# Patient Record
Sex: Female | Born: 1940
Health system: Southern US, Community
[De-identification: ages and names within clinical notes are randomized; demographics above are authoritative.]

## PROBLEM LIST (undated history)

## (undated) DIAGNOSIS — Z860100 Personal history of colon polyps, unspecified: Secondary | ICD-10-CM

## (undated) DIAGNOSIS — E785 Hyperlipidemia, unspecified: Secondary | ICD-10-CM

## (undated) DIAGNOSIS — Z973 Presence of spectacles and contact lenses: Secondary | ICD-10-CM

## (undated) DIAGNOSIS — G709 Myoneural disorder, unspecified: Secondary | ICD-10-CM

## (undated) DIAGNOSIS — N891 Moderate vaginal dysplasia: Secondary | ICD-10-CM

## (undated) DIAGNOSIS — H409 Unspecified glaucoma: Secondary | ICD-10-CM

## (undated) DIAGNOSIS — Z87410 Personal history of cervical dysplasia: Secondary | ICD-10-CM

## (undated) DIAGNOSIS — G2569 Other tics of organic origin: Secondary | ICD-10-CM

## (undated) DIAGNOSIS — I10 Essential (primary) hypertension: Secondary | ICD-10-CM

## (undated) DIAGNOSIS — M8580 Other specified disorders of bone density and structure, unspecified site: Secondary | ICD-10-CM

## (undated) HISTORY — PX: TUBAL LIGATION: SHX77

## (undated) HISTORY — DX: Hyperlipidemia, unspecified: E78.5

## (undated) HISTORY — DX: Essential (primary) hypertension: I10

## (undated) HISTORY — DX: Other specified disorders of bone density and structure, unspecified site: M85.80

## (undated) HISTORY — DX: Other tics of organic origin: G25.69

## (undated) HISTORY — PX: DILATION AND CURETTAGE OF UTERUS: SHX78

---

## 1998-02-19 ENCOUNTER — Emergency Department (HOSPITAL_COMMUNITY): Admission: EM | Admit: 1998-02-19 | Discharge: 1998-02-19 | Payer: Self-pay | Admitting: Emergency Medicine

## 1999-07-04 ENCOUNTER — Encounter: Payer: Self-pay | Admitting: Emergency Medicine

## 1999-07-04 ENCOUNTER — Emergency Department (HOSPITAL_COMMUNITY): Admission: EM | Admit: 1999-07-04 | Discharge: 1999-07-05 | Payer: Self-pay | Admitting: Emergency Medicine

## 1999-07-10 ENCOUNTER — Ambulatory Visit (HOSPITAL_COMMUNITY): Admission: RE | Admit: 1999-07-10 | Discharge: 1999-07-10 | Payer: Self-pay | Admitting: Endocrinology

## 1999-07-10 ENCOUNTER — Encounter: Payer: Self-pay | Admitting: Endocrinology

## 1999-12-06 ENCOUNTER — Encounter: Payer: Self-pay | Admitting: Internal Medicine

## 1999-12-06 ENCOUNTER — Ambulatory Visit (HOSPITAL_COMMUNITY): Admission: RE | Admit: 1999-12-06 | Discharge: 1999-12-06 | Payer: Self-pay | Admitting: Internal Medicine

## 2004-10-08 ENCOUNTER — Emergency Department (HOSPITAL_COMMUNITY): Admission: EM | Admit: 2004-10-08 | Discharge: 2004-10-09 | Payer: Self-pay | Admitting: Emergency Medicine

## 2005-12-30 ENCOUNTER — Ambulatory Visit: Payer: Self-pay | Admitting: Internal Medicine

## 2006-04-03 ENCOUNTER — Ambulatory Visit: Payer: Self-pay | Admitting: Internal Medicine

## 2006-05-15 ENCOUNTER — Ambulatory Visit: Payer: Self-pay | Admitting: Internal Medicine

## 2006-06-26 ENCOUNTER — Ambulatory Visit: Payer: Self-pay | Admitting: Internal Medicine

## 2007-07-04 DIAGNOSIS — E785 Hyperlipidemia, unspecified: Secondary | ICD-10-CM

## 2007-07-04 DIAGNOSIS — I10 Essential (primary) hypertension: Secondary | ICD-10-CM

## 2008-01-15 ENCOUNTER — Ambulatory Visit: Payer: Self-pay | Admitting: Internal Medicine

## 2008-01-15 DIAGNOSIS — R5381 Other malaise: Secondary | ICD-10-CM

## 2008-01-15 DIAGNOSIS — R5383 Other fatigue: Secondary | ICD-10-CM

## 2008-01-15 LAB — CONVERTED CEMR LAB
Albumin: 4 g/dL (ref 3.5–5.2)
Alkaline Phosphatase: 85 units/L (ref 39–117)
Bilirubin Urine: NEGATIVE
Bilirubin, Direct: 0.6 mg/dL — ABNORMAL HIGH (ref 0.0–0.3)
Eosinophils Absolute: 0.1 10*3/uL (ref 0.0–0.7)
GFR calc Af Amer: 93 mL/min
GFR calc non Af Amer: 77 mL/min
HCT: 42.6 % (ref 36.0–46.0)
Hemoglobin, Urine: NEGATIVE
MCV: 87.3 fL (ref 78.0–100.0)
Monocytes Absolute: 0.6 10*3/uL (ref 0.1–1.0)
Monocytes Relative: 9.5 % (ref 3.0–12.0)
Neutrophils Relative %: 64 % (ref 43.0–77.0)
Nitrite: NEGATIVE
Platelets: 286 10*3/uL (ref 150–400)
Potassium: 4.1 meq/L (ref 3.5–5.1)
RDW: 12.1 % (ref 11.5–14.6)
Sodium: 139 meq/L (ref 135–145)
Total Bilirubin: 1.4 mg/dL — ABNORMAL HIGH (ref 0.3–1.2)
Total Protein, Urine: NEGATIVE mg/dL
Urobilinogen, UA: 0.2 (ref 0.0–1.0)

## 2008-04-18 ENCOUNTER — Ambulatory Visit: Payer: Self-pay | Admitting: Internal Medicine

## 2008-04-18 DIAGNOSIS — H9209 Otalgia, unspecified ear: Secondary | ICD-10-CM | POA: Insufficient documentation

## 2008-08-28 ENCOUNTER — Ambulatory Visit: Payer: Self-pay | Admitting: Internal Medicine

## 2008-08-29 LAB — CONVERTED CEMR LAB
CO2: 27 meq/L (ref 19–32)
Chloride: 104 meq/L (ref 96–112)
GFR calc Af Amer: 93 mL/min
Glucose, Bld: 98 mg/dL (ref 70–99)
Potassium: 3.7 meq/L (ref 3.5–5.1)
Sodium: 139 meq/L (ref 135–145)

## 2008-12-17 ENCOUNTER — Ambulatory Visit (HOSPITAL_COMMUNITY): Admission: RE | Admit: 2008-12-17 | Discharge: 2008-12-17 | Payer: Self-pay | Admitting: Obstetrics & Gynecology

## 2009-03-11 LAB — HM MAMMOGRAPHY

## 2009-07-07 ENCOUNTER — Ambulatory Visit: Payer: Self-pay | Admitting: Internal Medicine

## 2009-07-07 DIAGNOSIS — F959 Tic disorder, unspecified: Secondary | ICD-10-CM | POA: Insufficient documentation

## 2009-12-09 ENCOUNTER — Encounter (INDEPENDENT_AMBULATORY_CARE_PROVIDER_SITE_OTHER): Payer: Self-pay | Admitting: *Deleted

## 2009-12-28 ENCOUNTER — Ambulatory Visit: Payer: Self-pay | Admitting: Internal Medicine

## 2009-12-28 ENCOUNTER — Telehealth: Payer: Self-pay | Admitting: Internal Medicine

## 2009-12-29 ENCOUNTER — Telehealth: Payer: Self-pay | Admitting: Internal Medicine

## 2010-02-05 ENCOUNTER — Encounter (INDEPENDENT_AMBULATORY_CARE_PROVIDER_SITE_OTHER): Payer: Self-pay | Admitting: *Deleted

## 2010-02-09 ENCOUNTER — Ambulatory Visit: Payer: Self-pay | Admitting: Internal Medicine

## 2010-02-23 ENCOUNTER — Ambulatory Visit: Payer: Self-pay | Admitting: Internal Medicine

## 2010-02-23 HISTORY — PX: COLONOSCOPY: SHX174

## 2010-02-25 ENCOUNTER — Encounter: Payer: Self-pay | Admitting: Internal Medicine

## 2010-07-01 ENCOUNTER — Ambulatory Visit: Payer: Self-pay | Admitting: Internal Medicine

## 2010-08-08 LAB — CONVERTED CEMR LAB
ALT: 25 units/L (ref 0–35)
AST: 28 units/L (ref 0–37)
Albumin: 4.1 g/dL (ref 3.5–5.2)
Basophils Absolute: 0 10*3/uL (ref 0.0–0.1)
Cholesterol: 255 mg/dL — ABNORMAL HIGH (ref 0–200)
Direct LDL: 174.6 mg/dL
Eosinophils Relative: 2.3 % (ref 0.0–5.0)
GFR calc non Af Amer: 89.56 mL/min (ref >60)
Glucose, Bld: 87 mg/dL (ref 70–99)
HCT: 42 % (ref 36.0–46.0)
Hemoglobin: 14 g/dL (ref 12.0–15.0)
Lymphs Abs: 2.1 10*3/uL (ref 0.7–4.0)
Monocytes Relative: 12.3 % — ABNORMAL HIGH (ref 3.0–12.0)
Neutro Abs: 1.9 10*3/uL (ref 1.4–7.7)
Potassium: 4.1 meq/L (ref 3.5–5.1)
RDW: 13.8 % (ref 11.5–14.6)
Sodium: 143 meq/L (ref 135–145)
Specific Gravity, Urine: 1.02 (ref 1.000–1.030)
Total Protein, Urine: NEGATIVE mg/dL
Urine Glucose: NEGATIVE mg/dL
pH: 5.5 (ref 5.0–8.0)

## 2010-08-10 NOTE — Procedures (Signed)
Summary: Colonoscopy  Patient: Laurie Pratt Note: All result statuses are Final unless otherwise noted.  Tests: (1) Colonoscopy (COL)   COL Colonoscopy           DONE (C)     [facility] [employer] [facility] [employer].     [place], [place]  [id number]           COLONOSCOPY PROCEDURE REPORT           PATIENT:  [name], [name]  MR#:  [id number]     BIRTHDATE:  06-26-1941, 45 yrs. old  GENDER:  female     ENDOSCOPIST:  Lowella Bandy. Olevia Perches, MD     REF. BY:  Altamese Seaford. Plotnikov, M.D.     PROCEDURE DATE:  02/23/2010     PROCEDURE:  Colonoscopy [id number]     ASA CLASS:  Class II     INDICATIONS:  Routine Risk Screening     MEDICATIONS:   Versed 7 mg, Fentanyl 50 mcg           DESCRIPTION OF PROCEDURE:   After the risks benefits and     alternatives of the procedure were thoroughly explained, informed     consent was obtained.  Digital rectal exam was performed and     revealed no rectal masses.   The LB [id number]-[id number] endoscope     was introduced through the anus and advanced to the cecum, which     was identified by both the appendix and ileocecal valve, without     limitations.  The quality of the prep was good, using MiraLax.     The instrument was then slowly withdrawn as the colon was fully     examined.     <<PROCEDUREIMAGES>>           FINDINGS:  A sessile polyp was found. 5 mm polyp at 20 cm The     polyp was removed using cold biopsy forceps (see image7, image8,     and image9).  This was otherwise a normal examination of the colon     (see image10, image6, image5, image4, image3, and image1).     Retroflexed views in the rectum revealed no abnormalities.    The     scope was then withdrawn from the patient and the procedure     completed.           COMPLICATIONS:  None     ENDOSCOPIC IMPRESSION:     1) Sessile polyp     2) Otherwise normal examination     RECOMMENDATIONS:     1) Await pathology results     2) High fiber diet.     REPEAT EXAM:  In 7 - 10 year(s) for.  recall  pending results of     the polyp           ______________________________     Lowella Bandy. Olevia Perches, MD           CC:           n.     REVISED:  02/23/2010 11:30 AM     eSIGNED:   Lowella Bandy. Brodie at 02/23/2010 11:30 AM           Renetta Chalk, [id number]  Note: An exclamation mark (!) indicates a result that was not dispersed into the flowsheet. Document Creation Date: 02/23/2010 11:30 AM _______________________________________________________________________  (1) Order result status: Final Collection or observation date-time: 02/23/2010 10:46 Requested date-time:  Receipt date-time:  Reported date-time:  Referring Physician:   Ordering Physician: Delfin Edis [phone]) Specimen Source:  Source: Tawanna Cooler Order Number: [id number] Lab site:   Appended Document: Colonoscopy     Procedures Next Due Date:    Colonoscopy: 03/2020

## 2010-08-10 NOTE — Letter (Signed)
Summary: [name] Instructions  [facility]  [place], [place] [address]   Phone: [phone]  Fax: [phone]       [name]    02-Nov-1940    MRN: [id number]       Procedure Day Sudie Grumbling:  Duke Salvia  02/23/10     Arrival Time: 9:00AM     Procedure Time:  10:00AM     Location of Procedure:                    _ X_  [place] (4th Floor)   [place]  Starting 5 days prior to your procedure 02/18/10 do not eat nuts, seeds, popcorn, corn, beans, peas,  salads, or any raw vegetables.  Do not take any fiber supplements (e.g. Metamucil, Citrucel, and Benefiber). ____________________________________________________________________________________________________   THE DAY BEFORE YOUR PROCEDURE         DATE: 02/22/10  DAY: MONDAY  1   Drink clear liquids the entire day-NO SOLID FOOD  2   Do not drink anything colored red or purple.  Avoid juices with pulp.  No orange juice.  3   Drink at least 64 oz. (8 glasses) of fluid/clear liquids during the day to prevent dehydration and help the prep work efficiently.  CLEAR LIQUIDS INCLUDE: Water Jello Ice Popsicles Tea (sugar ok, no milk/cream) Powdered fruit flavored drinks Coffee (sugar ok, no milk/cream) Gatorade Juice: apple, white grape, white cranberry  Lemonade Clear bullion, consomm, broth Carbonated beverages (any kind) Strained chicken noodle soup Hard Candy  4   Mix the entire bottle of Miralax with 64 oz. of Gatorade/Powerade in the morning and put in the refrigerator to chill.  5   At 3:00 pm take 2 Dulcolax/Bisacodyl tablets.  6   At 4:30 pm take one Reglan/Metoclopramide tablet.  7  Starting at 5:00 pm drink one 8 oz glass of the Miralax mixture every 15-20 minutes until you have finished drinking the entire 64 oz.  You should finish drinking prep around 7:30 or 8:00 pm.  8   If you are nauseated, you may take the 2nd Reglan/Metoclopramide  tablet at 6:30 pm.        9    At 8:00 pm take 2 more DULCOLAX/Bisacodyl tablets.     THE DAY OF YOUR PROCEDURE      DATE:  02/23/10  DAY: Duke Salvia  You may drink clear liquids until 8:00AM  (2 HOURS BEFORE PROCEDURE).   MEDICATION INSTRUCTIONS  Unless otherwise instructed, you should take regular prescription medications with a small sip of water as early as possible the morning of your procedure.         OTHER INSTRUCTIONS  You will need a responsible adult at least 68 years of age to accompany you and drive you home.   This person must remain in the waiting room during your procedure.  Wear loose fitting clothing that is easily removed.  Leave jewelry and other valuables at home.  However, you may wish to bring a book to read or an iPod/MP3 player to listen to music as you wait for your procedure to start.  Remove all body piercing jewelry and leave at home.  Total time from sign-in until discharge is approximately 2-3 hours.  You should go home directly after your procedure and rest.  You can resume normal activities the day after your procedure.  The day of your procedure you should not:   Drive  Make legal decisions   Operate machinery   Drink alcohol   Return to work  You will receive specific instructions about eating, activities and medications before you leave.   The above instructions have been reviewed and explained to me by  Randall Hiss RN  February 09, 2010 9:17 AM    I fully understand and can verbalize these instructions _____________________________ Date _______

## 2010-08-10 NOTE — Letter (Signed)
Summary: Primary Care Appointment Letter  [facility] [place]   [place], [place] [address]   Phone: [phone]  Fax: [phone]    12/09/2009 MRN: [id number]  Laurie Pratt [place], [place]  [address]  Dear [name],   Your Primary Care Physician Cassandria Anger MD has indicated that:    _______it is time to schedule an appointment.    _______you missed your appointment on______ and need to call and          reschedule.    _______you need to have lab work done.    _______you need to schedule an appointment discuss lab or test results.    ___X____you need to call to reschedule your appointment that is                       scheduled on 01/05/10 @ 830A W/DR [place].     Please call our office as soon as possible. Our phone number is [phone]. Please press option 1. Our office is open 8a-12noon and 1p-5p, Monday through Friday.     Thank you,    [place]

## 2010-08-10 NOTE — Progress Notes (Signed)
Summary: rx clarification  Phone Note From Pharmacy   [facility] [id number] Summary of Call: Pharmacy is requesting review of Trihexyphenidyl 2mg  tabs stating that the patient was previously taking 1.5 tabs by mouth three times a day from Dr. Doy Mince. Please advise. Initial call taken by: Ernestene Mention,  December 29, 2009 10:15 AM  Follow-up for Phone Call        Correct as per Dr Alfonso Patten. - I did not change the dose today Follow-up by: Cassandria Anger MD,  December 29, 2009 5:40 PM    New/Updated Medications: TRIHEXYPHENIDYL HCL 2 MG  TABS (TRIHEXYPHENIDYL HCL) 1.5 tab tid Prescriptions: TRIHEXYPHENIDYL HCL 2 MG  TABS (TRIHEXYPHENIDYL HCL) 1.5 tab tid  #135 x 11   Entered and Authorized by:   Cassandria Anger MD   Signed by:   Charlsie Quest, CMA on 12/29/2009   Method used:   [phone][facility] (retail)       [address]       [place], [place]  [address]       Ph: [phone]       Fax: [phone]   RxID:   [id number]

## 2010-08-10 NOTE — Assessment & Plan Note (Signed)
Summary: CPX Bonney Leitz  [employer]  #   Vital Signs:  Patient profile:   70 year old female Height:      66.5 inches (168.91 cm) Weight:      173.75 pounds (78.98 kg) BMI:     27.72 O2 Sat:      98 % on Room air Temp:     97.8 degrees F (36.56 degrees C) oral Pulse rate:   58 / minute BP sitting:   160 / 84  (left arm) Cuff size:   regular  Vitals Entered By: Ernestene Mention (December 28, 2009 9:36 AM)  O2 Flow:  Room air CC: CPX./kb Is Patient Diabetic? No Pain Assessment Patient in pain? no      Comments Patient notes EKG done 03/2009 was normal./kb   CC:  CPX./kb.  History of Present Illness: The patient presents for a wellness examination  She did not take her meds today Patient past medical history, social history, and family history reviewed in detail no significant changes.  Patient is physically active. Depression is negative and mood is good. Hearing is normal, and able to perform activities of daily living. Risk of falling is negligible and home safety has been reviewed and is appropriate. Patient has normal height, weight, and visual acuity. Patient has been counseled on age-appropriate routine health concerns for screening and prevention. Education, counseling done. F/u HTN, dyslipidemia, tics.   Current Medications (verified): 1)  Norvasc 5 Mg  Tabs (Amlodipine Besylate) .... Once Daily 2)  Trihexyphenidyl Hcl 2 Mg  Tabs (Trihexyphenidyl Hcl) .... Once Daily 3)  Klor-Con M20 20 Meq  Tbcr (Potassium Chloride Crys Cr) .... Once Daily 4)  Vitamin D3 1000 Unit  Tabs (Cholecalciferol) .Marland Kitchen.. 1 By Mouth Daily 5)  Atenolol-Chlorthalidone 100-25 Mg Tabs (Atenolol-Chlorthalidone) .Marland Kitchen.. 1 By Mouth Qam  Allergies (verified): 1)  ! Ace Inhibitors 2)  ! Cardura 3)  ! Verapamil Hcl Cr (Verapamil Hcl)  Past History:  Past Medical History: Reviewed history from 01/15/2008 and no changes required. Hypertension Tics Dr Doy Mince  Review of Systems  The patient denies anorexia, fever,  weight loss, weight gain, vision loss, decreased hearing, hoarseness, chest pain, syncope, dyspnea on exertion, peripheral edema, prolonged cough, headaches, hemoptysis, abdominal pain, melena, hematochezia, severe indigestion/heartburn, hematuria, incontinence, genital sores, muscle weakness, suspicious skin lesions, transient blindness, difficulty walking, depression, unusual weight change, abnormal bleeding, enlarged lymph nodes, angioedema, and breast masses.         tics  Physical Exam  General:  Well-developed,well-nourished,in no acute distress; alert,appropriate and cooperative throughout examination Head:  Normocephalic and atraumatic without obvious abnormalities. No apparent alopecia or balding. Eyes:  No corneal or conjunctival inflammation noted. EOMI. Perrla.  Ears:  External ear exam shows no significant lesions or deformities.  Otoscopic examination reveals clear canals, tympanic membranes are intact bilaterally without bulging, retraction, inflammation or discharge. Hearing is grossly normal bilaterally. Nose:  External nasal examination shows no deformity or inflammation. Nasal mucosa are pink and moist without lesions or exudates. Mouth:  Oral mucosa and oropharynx without lesions or exudates.  Teeth in good repair. Neck:  No deformities, masses, or tenderness noted. Chest Wall:  No deformities, masses, or tenderness noted. Lungs:  Normal respiratory effort, chest expands symmetrically. Lungs are clear to auscultation, no crackles or wheezes. Heart:  Normal rate and regular rhythm. S1 and S2 normal without gallop, murmur, click, rub or other extra sounds. Abdomen:  Bowel sounds positive,abdomen soft and non-tender without masses, organomegaly or hernias noted. Msk:  No  deformity or scoliosis noted of thoracic or lumbar spine.   Pulses:  R and L carotid,radial,femoral,dorsalis pedis and posterior tibial pulses are full and equal bilaterally Extremities:  No clubbing, cyanosis,  edema, or deformity noted with normal full range of motion of all joints.   Neurologic:  Facial tics present Skin:  Intact without suspicious lesions or rashes Cervical Nodes:  No lymphadenopathy noted Inguinal Nodes:  No significant adenopathy Psych:  Cognition and judgment appear intact. Alert and cooperative with normal attention span and concentration. No apparent delusions, illusions, hallucinations   Contraindications/Deferment of Procedures/Staging:    Test/Procedure: Pneumovax vaccine    Reason for deferment: patient declined   Impression & Recommendations:  Problem # 1:  PHYSICAL EXAMINATION (ICD-V70.0) Assessment New  Overall doing well, age appropriate education and counseling updated and referral for appropriate preventive services done unless declined, immunizations up to date or declined, diet counseling done if overweight, urged to quit smoking if smokes, most recent labs reviewed and current ordered if appropriate, ecg reviewed or declined (interpretation per ECG scanned in the EMR if done); information regarding Medicare Preventation requirements given if appropriate.   Orders: EKG w/ Interpretation (93000) Gastroenterology Referral (GI) TLB-BMP (Basic Metabolic Panel-BMET) (99991111) TLB-CBC Platelet - w/Differential (85025-CBCD) TLB-Hepatic/Liver Function Pnl (80076-HEPATIC) TLB-Lipid Panel (80061-LIPID) TLB-TSH (Thyroid Stimulating Hormone) (84443-TSH) TLB-Udip ONLY (81003-UDIP)  Problem # 2:  HYPERTENSION (ICD-401.9) Assessment: Deteriorated  Her updated medication list for this problem includes:    Norvasc 5 Mg Tabs (Amlodipine besylate) ..... Once daily    Atenolol-chlorthalidone 100-25 Mg Tabs (Atenolol-chlorthalidone) .Marland Kitchen... 1 by mouth qam  Orders: Prescription Created Electronically [phone])  BP today: 160/84 Prior BP: 130/84 (07/07/2009)  Labs Reviewed: K+: 3.7 (08/28/2008) Creat: : 0.8 (08/28/2008)     Problem # 3:  TIC  (ICD-307.20) Assessment: Improved  Problem # 4:  HYPERLIPIDEMIA (ICD-272.4) Assessment: Deteriorated  Labs Reviewed: SGOT: 43 (01/15/2008)   SGPT: 23 (01/15/2008)  Her updated medication list for this problem includes:    Lovastatin 20 Mg Tabs (Lovastatin) .Marland Kitchen... 1 by mouth once daily for cholesterol  Complete Medication List: 1)  Norvasc 5 Mg Tabs (Amlodipine besylate) .... Once daily 2)  Trihexyphenidyl Hcl 2 Mg Tabs (Trihexyphenidyl hcl) .... Once daily 3)  Klor-con M20 20 Meq Tbcr (Potassium chloride crys cr) .... Once daily 4)  Atenolol-chlorthalidone 100-25 Mg Tabs (Atenolol-chlorthalidone) .Marland Kitchen.. 1 by mouth qam 5)  Vitamin D3 1000 Unit Tabs (Cholecalciferol) .Marland Kitchen.. 1 by mouth daily 6)  Lovastatin 20 Mg Tabs (Lovastatin) .Marland Kitchen.. 1 by mouth once daily for cholesterol  Patient Instructions: 1)  Please schedule a follow-up appointment in 6 months. 2)  BMP prior to visit, ICD-9: 3)  Hepatic Panel prior to visit, ICD-9: 4)  Lipid Panel prior to visit, ICD-9:401.1 995.20 5)  TSH prior to visit, ICD-9: Prescriptions: LOVASTATIN 20 MG TABS (LOVASTATIN) 1 by mouth once daily for cholesterol  #30 x 12   Entered and Authorized by:   Cassandria Anger MD   Signed by:   Cassandria Anger MD on 12/29/2009   Method used:   Electronically to        [facility] (retail)       [place], [place]  [id number]       Ph: [phone]       Fax: [phone]   [id number] ATENOLOL-CHLORTHALIDONE 100-25 MG TABS (ATENOLOL-CHLORTHALIDONE) 1 by mouth qam  #30 x 11   Entered and  Authorized by:   Cassandria Anger MD   Signed by:   Cassandria Anger MD on 12/28/2009   Method used:   Electronically to        [facility] (retail)       [place], [place]  [id number]       Ph: [phone]       Fax: [phone]   RxID:   [id number] KLOR-CON M20 20 MEQ  TBCR (POTASSIUM CHLORIDE CRYS CR) once  daily  #30 x 11   Entered and Authorized by:   Cassandria Anger MD   Signed by:   Cassandria Anger MD on 12/28/2009   Method used:   Electronically to        [facility] (retail)       [place], [place]  [id number]       Ph: [phone]       Fax: [phone]   [id number] TRIHEXYPHENIDYL HCL 2 MG  TABS (TRIHEXYPHENIDYL HCL) once daily  #30 x 11   Entered and Authorized by:   Cassandria Anger MD   Signed by:   Cassandria Anger MD on 12/28/2009   Method used:   Electronically to        [facility] (retail)       [place], [place]  [id number]       Ph: [phone]       Fax: [phone]   [id number] NORVASC 5 MG  TABS (AMLODIPINE BESYLATE) once daily  #30 x 11   Entered and Authorized by:   Cassandria Anger MD   Signed by:   Cassandria Anger MD on 12/28/2009   Method used:   Electronically to        [facility] (retail)       [place], [place]  [id number]       Ph: [phone]       Fax: [phone]   [id number]

## 2010-08-10 NOTE — Progress Notes (Signed)
Summary: Cholesterol Med  Phone Note Call from Patient   Summary of Call: See lab results, Pt is agreeable to starting med for cholesterol. Please send in to [facility].  Initial call taken by: Charlsie Quest, [place],  December 28, 2009 5:52 PM  Follow-up for Phone Call        OK Lovastatin 20 mg a day Follow-up by: Cassandria Anger MD,  December 29, 2009 7:43 AM

## 2010-08-10 NOTE — Letter (Signed)
Summary: Patient Notice- Polyp Results  [facility]  [address] [place], [place] [address]   Phone: [phone]  Fax: [phone]        February 25, 2010 MRN: [id number]    Laurie Pratt [place], [place]  [address]    Dear [name],  I am pleased to inform you that the colon polyp(s) removed during your recent colonoscopy was (were) found to be benign (no cancer detected) upon pathologic examination.The polyps were hyperplastic ( not precancerous)  I recommend you have a repeat colonoscopy examination in 10_ years to look for recurrent polyps, as having colon polyps increases your risk for having recurrent polyps or even colon cancer in the future.  Should you develop new or worsening symptoms of abdominal pain, bowel habit changes or bleeding from the rectum or bowels, please schedule an evaluation with either your primary care physician or with me.  Additional information/recommendations:  _x_ No further action with gastroenterology is needed at this time. Please      follow-up with your primary care physician for your other healthcare      needs.  __ Please call [phone] to schedule a return visit to review your      situation.  __ Please keep your follow-up visit as already scheduled.  __ Continue treatment plan as outlined the day of your exam.  Please call us if you are having persistent problems or have questions about your condition that have not been fully answered at this time.  Sincerely,  Lafayette Dragon MD  This letter has been electronically signed by your physician.  Appended Document: Patient Notice- Polyp Results letter mailed

## 2010-08-10 NOTE — Miscellaneous (Signed)
Summary: [facility] previsit  Clinical Lists Changes  Medications: Added new medication of DULCOLAX 5 MG  TBEC (BISACODYL) Day before procedure take 2 at 3pm and 2 at 8pm. - Signed Added new medication of METOCLOPRAMIDE HCL 10 MG  TABS (METOCLOPRAMIDE HCL) As per prep instructions. - Signed Added new medication of MIRALAX   POWD (POLYETHYLENE GLYCOL 3350) As per prep  instructions. - Signed Rx of DULCOLAX 5 MG  TBEC (BISACODYL) Day before procedure take 2 at 3pm and 2 at 8pm.;  #4 x 0;  Signed;  Entered by: Randall Hiss RN;  Authorized by: Lafayette Dragon MD;  Method used: Electronically to [facility] [place], [place], [place]  [id number], Ph: [phone], Fax: [phone] Rx of METOCLOPRAMIDE HCL 10 MG  TABS (METOCLOPRAMIDE HCL) As per prep instructions.;  #2 x 0;  Signed;  Entered by: Randall Hiss RN;  Authorized by: Lafayette Dragon MD;  Method used: Electronically to [facility] [place], [place], [place]  [id number], Ph: [phone], Fax: [phone] Rx of MIRALAX   POWD (POLYETHYLENE GLYCOL 3350) As per prep  instructions.;  #255gm x 0;  Signed;  Entered by: Randall Hiss RN;  Authorized by: Lafayette Dragon MD;  Method used: Electronically to [facility] [place], [place], [place]  [id number], Ph: [phone], Fax: [phone]    Prescriptions: MIRALAX   POWD (POLYETHYLENE GLYCOL 3350) As per prep  instructions.  #255gm x 0   Entered by:   Randall Hiss RN   Authorized by:   Lafayette Dragon MD   Signed by:   Randall Hiss RN on 02/09/2010   Method used:   Electronically to        [facility] (retail)       [place], [place]  [id number]       Ph: [phone]       Fax: [phone]   [id number] METOCLOPRAMIDE HCL 10 MG  TABS (METOCLOPRAMIDE HCL) As per prep instructions.  #2 x 0   Entered by:   Randall Hiss RN   Authorized  by:   Lafayette Dragon MD   Signed by:   Randall Hiss RN on 02/09/2010   Method used:   Electronically to        [facility] (retail)       [place], [place]  [id number]       Ph: [phone]       Fax: [phone]   [id number] DULCOLAX 5 MG  TBEC (BISACODYL) Day before procedure take 2 at 3pm and 2 at 8pm.  #4 x 0   Entered by:   Randall Hiss RN   Authorized by:   Lafayette Dragon MD   Signed by:   Randall Hiss RN on 02/09/2010   Method used:   Electronically to        [facility] (retail)       [address]       [place], [place]  [id number]       Ph: [phone]       Fax: [phone]   [id number]

## 2011-01-02 ENCOUNTER — Other Ambulatory Visit: Payer: Self-pay | Admitting: Internal Medicine

## 2011-01-06 ENCOUNTER — Encounter: Payer: Self-pay | Admitting: Internal Medicine

## 2011-01-06 ENCOUNTER — Ambulatory Visit (INDEPENDENT_AMBULATORY_CARE_PROVIDER_SITE_OTHER): Payer: BC Managed Care – PPO | Admitting: Internal Medicine

## 2011-01-06 DIAGNOSIS — E785 Hyperlipidemia, unspecified: Secondary | ICD-10-CM

## 2011-01-06 DIAGNOSIS — F959 Tic disorder, unspecified: Secondary | ICD-10-CM

## 2011-01-06 DIAGNOSIS — I10 Essential (primary) hypertension: Secondary | ICD-10-CM

## 2011-01-06 MED ORDER — ATENOLOL-CHLORTHALIDONE 100-25 MG PO TABS: 1.0000 | ORAL_TABLET | Freq: Every day | ORAL | Status: DC

## 2011-01-06 MED ORDER — VITAMIN D 1000 UNITS PO TABS: 1000.0000 [IU] | ORAL_TABLET | Freq: Every day | ORAL | Status: DC

## 2011-01-06 MED ORDER — LOVASTATIN 20 MG PO TABS: 20.0000 mg | ORAL_TABLET | Freq: Every day | ORAL | Status: DC

## 2011-01-06 MED ORDER — POTASSIUM CHLORIDE CRYS ER 20 MEQ PO TBCR: 20.0000 meq | EXTENDED_RELEASE_TABLET | Freq: Every day | ORAL | Status: DC

## 2011-01-06 MED ORDER — TRIHEXYPHENIDYL HCL 2 MG PO TABS: 3.0000 mg | ORAL_TABLET | Freq: Three times a day (TID) | ORAL | Status: DC

## 2011-01-06 MED ORDER — AMLODIPINE BESYLATE 5 MG PO TABS: 5.0000 mg | ORAL_TABLET | Freq: Every day | ORAL | Status: DC

## 2011-01-06 NOTE — Assessment & Plan Note (Signed)
Re-start meds Risks associated with treatment noncompliance were discussed. Compliance was encouraged.  

## 2011-01-06 NOTE — Progress Notes (Signed)
  Subjective:    Patient ID: Laurie Pratt, female    DOB: 31-May-1941, 70 y.o.   MRN: [id number]  HPI  The patient presents for a follow-up of  chronic hypertension, chronic dyslipidemia, tic controlled with medicines  She ran out of BP meds  Review of Systems  Constitutional: Negative for chills, activity change, appetite change, fatigue and unexpected weight change.  HENT: Negative for congestion, mouth sores and sinus pressure.   Eyes: Negative for visual disturbance.  Respiratory: Negative for cough and chest tightness.   Gastrointestinal: Negative for nausea and abdominal pain.  Genitourinary: Negative for frequency, difficulty urinating and vaginal pain.  Musculoskeletal: Negative for back pain and gait problem.  Skin: Negative for pallor and rash.  Neurological: Negative for dizziness, tremors, weakness, numbness and headaches.       Facial tic  Psychiatric/Behavioral: Negative for confusion and sleep disturbance.       Objective:   Physical Exam  Constitutional: She appears well-developed and well-nourished. No distress.  HENT:  Head: Normocephalic.  Right Ear: External ear normal.  Left Ear: External ear normal.  Nose: Nose normal.  Mouth/Throat: Oropharynx is clear and moist.  Eyes: Conjunctivae are normal. Pupils are equal, round, and reactive to light. Right eye exhibits no discharge. Left eye exhibits no discharge.  Neck: Normal range of motion. Neck supple. No JVD present. No tracheal deviation present. No thyromegaly present.  Cardiovascular: Normal rate, regular rhythm and normal heart sounds.   Pulmonary/Chest: No stridor. No respiratory distress. She has no wheezes.  Abdominal: Soft. Bowel sounds are normal. She exhibits no distension and no mass. There is no tenderness. There is no rebound and no guarding.  Musculoskeletal: She exhibits no edema and no tenderness.  Lymphadenopathy:    She has no cervical adenopathy.  Neurological: She displays normal  reflexes. No cranial nerve deficit. She exhibits normal muscle tone. Coordination normal.       Facial twitch is present  Skin: No rash noted. No erythema.  Psychiatric: She has a normal mood and affect. Her behavior is normal. Judgment and thought content normal.          Assessment & Plan:

## 2011-01-06 NOTE — Assessment & Plan Note (Signed)
On Rx 

## 2011-06-23 ENCOUNTER — Encounter: Payer: Self-pay | Admitting: Internal Medicine

## 2011-06-23 ENCOUNTER — Ambulatory Visit (INDEPENDENT_AMBULATORY_CARE_PROVIDER_SITE_OTHER): Payer: BC Managed Care – PPO | Admitting: Internal Medicine

## 2011-06-23 VITALS — BP 190/110 | HR 80 | Temp 98.3°F | Resp 16 | Wt 167.0 lb

## 2011-06-23 DIAGNOSIS — I10 Essential (primary) hypertension: Secondary | ICD-10-CM

## 2011-06-23 DIAGNOSIS — R5383 Other fatigue: Secondary | ICD-10-CM

## 2011-06-23 DIAGNOSIS — R5381 Other malaise: Secondary | ICD-10-CM

## 2011-06-23 DIAGNOSIS — E785 Hyperlipidemia, unspecified: Secondary | ICD-10-CM

## 2011-06-23 DIAGNOSIS — F959 Tic disorder, unspecified: Secondary | ICD-10-CM

## 2011-06-23 DIAGNOSIS — Z0000 Encounter for general adult medical examination without abnormal findings: Secondary | ICD-10-CM

## 2011-06-23 MED ORDER — VITAMIN D 1000 UNITS PO TABS: 1000.0000 [IU] | ORAL_TABLET | Freq: Every day | ORAL | Status: DC

## 2011-06-23 MED ORDER — AMLODIPINE BESYLATE 10 MG PO TABS: 10.0000 mg | ORAL_TABLET | Freq: Every day | ORAL | Status: DC

## 2011-06-23 NOTE — Assessment & Plan Note (Signed)
Continue with current prescription therapy as reflected on the Med list.  

## 2011-06-23 NOTE — Progress Notes (Signed)
  Subjective:    Patient ID: Laurie Pratt, female    DOB: 1941-03-17, 70 y.o.   MRN: [id number]  HPI  The patient presents for a follow-up of  chronic hypertension, chronic dyslipidemia, tic controlled with medicines    Review of Systems  Constitutional: Negative for chills, activity change, appetite change, fatigue and unexpected weight change.  HENT: Negative for congestion, mouth sores and sinus pressure.   Eyes: Negative for visual disturbance.  Respiratory: Negative for cough and chest tightness.   Gastrointestinal: Negative for nausea and abdominal pain.  Genitourinary: Negative for frequency, difficulty urinating and vaginal pain.  Musculoskeletal: Negative for back pain and gait problem.  Skin: Negative for pallor and rash.  Neurological: Negative for dizziness, tremors, weakness, numbness and headaches.  Psychiatric/Behavioral: Negative for confusion and sleep disturbance.       Objective:   Physical Exam  Constitutional: She appears well-developed and well-nourished. No distress.  HENT:  Head: Normocephalic.  Right Ear: External ear normal.  Left Ear: External ear normal.  Nose: Nose normal.  Mouth/Throat: Oropharynx is clear and moist.  Eyes: Conjunctivae are normal. Pupils are equal, round, and reactive to light. Right eye exhibits no discharge. Left eye exhibits no discharge.  Neck: Normal range of motion. Neck supple. No JVD present. No tracheal deviation present. No thyromegaly present.  Cardiovascular: Normal rate, regular rhythm and normal heart sounds.   Pulmonary/Chest: No stridor. No respiratory distress. She has no wheezes.  Abdominal: Soft. Bowel sounds are normal. She exhibits no distension and no mass. There is no tenderness. There is no rebound and no guarding.  Musculoskeletal: She exhibits edema (trace B). She exhibits no tenderness.  Lymphadenopathy:    She has no cervical adenopathy.  Neurological: She displays normal reflexes. No cranial nerve  deficit. She exhibits normal muscle tone. Coordination normal.       Face tic  Skin: No rash noted. No erythema.  Psychiatric: She has a normal mood and affect. Her behavior is normal. Judgment and thought content normal.          Assessment & Plan:

## 2011-06-23 NOTE — Assessment & Plan Note (Addendum)
BP Readings from Last 3 Encounters:  06/23/11 190/110  01/06/11 178/90  12/28/09 160/84  See med change: double Norvasc if tol. Consider ARB NAS diet

## 2011-06-23 NOTE — Patient Instructions (Signed)
BP Readings from Last 3 Encounters:  06/23/11 190/110  01/06/11 178/90  12/28/09 160/84  Low salt diet

## 2011-06-23 NOTE — Assessment & Plan Note (Addendum)
Better lately 

## 2011-08-04 DIAGNOSIS — R259 Unspecified abnormal involuntary movements: Secondary | ICD-10-CM | POA: Diagnosis not present

## 2011-08-25 ENCOUNTER — Ambulatory Visit (INDEPENDENT_AMBULATORY_CARE_PROVIDER_SITE_OTHER): Payer: BC Managed Care – PPO | Admitting: Internal Medicine

## 2011-08-25 ENCOUNTER — Other Ambulatory Visit (INDEPENDENT_AMBULATORY_CARE_PROVIDER_SITE_OTHER): Payer: BC Managed Care – PPO

## 2011-08-25 ENCOUNTER — Encounter: Payer: Self-pay | Admitting: Internal Medicine

## 2011-08-25 VITALS — BP 150/90 | HR 88 | Temp 98.8°F | Resp 16 | Wt 168.0 lb

## 2011-08-25 DIAGNOSIS — E785 Hyperlipidemia, unspecified: Secondary | ICD-10-CM

## 2011-08-25 DIAGNOSIS — R5381 Other malaise: Secondary | ICD-10-CM

## 2011-08-25 DIAGNOSIS — F959 Tic disorder, unspecified: Secondary | ICD-10-CM

## 2011-08-25 DIAGNOSIS — R5383 Other fatigue: Secondary | ICD-10-CM

## 2011-08-25 DIAGNOSIS — I10 Essential (primary) hypertension: Secondary | ICD-10-CM

## 2011-08-25 DIAGNOSIS — Z0000 Encounter for general adult medical examination without abnormal findings: Secondary | ICD-10-CM

## 2011-08-25 LAB — CBC WITH DIFFERENTIAL/PLATELET
Basophils Relative: 0.9 % (ref 0.0–3.0)
Eosinophils Absolute: 0.1 10*3/uL (ref 0.0–0.7)
HCT: 42.8 % (ref 36.0–46.0)
Lymphs Abs: 2.1 10*3/uL (ref 0.7–4.0)
MCHC: 33.4 g/dL (ref 30.0–36.0)
MCV: 86.9 fl (ref 78.0–100.0)
Monocytes Absolute: 0.7 10*3/uL (ref 0.1–1.0)
Neutrophils Relative %: 42.8 % — ABNORMAL LOW (ref 43.0–77.0)
RBC: 4.92 Mil/uL (ref 3.87–5.11)

## 2011-08-25 LAB — BASIC METABOLIC PANEL
GFR: 89.12 mL/min (ref >60.00)
Potassium: 3.8 mEq/L (ref 3.5–5.1)
Sodium: 140 mEq/L (ref 135–145)

## 2011-08-25 LAB — URINALYSIS
Hgb urine dipstick: NEGATIVE
Leukocytes, UA: NEGATIVE
Nitrite: NEGATIVE
Specific Gravity, Urine: 1.01 (ref 1.000–1.030)
Urine Glucose: NEGATIVE
Urobilinogen, UA: 0.2 (ref 0.0–1.0)

## 2011-08-25 LAB — HEPATIC FUNCTION PANEL
Albumin: 3.8 g/dL (ref 3.5–5.2)
Bilirubin, Direct: 0.1 mg/dL (ref 0.0–0.3)
Total Protein: 7.7 g/dL (ref 6.0–8.3)

## 2011-08-25 LAB — LIPID PANEL
HDL: 68.3 mg/dL (ref >39.00)
Triglycerides: 79 mg/dL (ref 0.0–149.0)

## 2011-08-25 LAB — TSH: TSH: 3.87 u[IU]/mL (ref 0.35–5.50)

## 2011-08-25 LAB — LDL CHOLESTEROL, DIRECT: Direct LDL: 140.5 mg/dL

## 2011-08-25 MED ORDER — LOSARTAN POTASSIUM 100 MG PO TABS: 100.0000 mg | ORAL_TABLET | Freq: Every day | ORAL | Status: DC

## 2011-08-25 NOTE — Assessment & Plan Note (Signed)
Continue with current prescription therapy as reflected on the Med list.  

## 2011-08-25 NOTE — Assessment & Plan Note (Signed)
No change 

## 2011-08-25 NOTE — Progress Notes (Signed)
Patient ID: Laurie Pratt, female   DOB: 1940/11/22, 71 y.o.   MRN: [id number]  Subjective:    Patient ID: Laurie Pratt, female    DOB: Aug 28, 1940, 71 y.o.   MRN: [id number]  HPI  The patient presents for a follow-up of  chronic hypertension, chronic dyslipidemia, tic    Review of Systems  Constitutional: Negative for chills, activity change, appetite change, fatigue and unexpected weight change.  HENT: Negative for congestion, mouth sores and sinus pressure.   Eyes: Negative for visual disturbance.  Respiratory: Negative for cough and chest tightness.   Gastrointestinal: Negative for nausea and abdominal pain.  Genitourinary: Negative for frequency, difficulty urinating and vaginal pain.  Musculoskeletal: Negative for back pain and gait problem.  Skin: Negative for pallor and rash.  Neurological: Negative for dizziness, tremors, weakness, numbness and headaches.  Psychiatric/Behavioral: Negative for confusion and sleep disturbance.   BP Readings from Last 3 Encounters:  08/25/11 150/90  06/23/11 190/110  01/06/11 178/90        Objective:   Physical Exam  Constitutional: She appears well-developed and well-nourished. No distress.  HENT:  Head: Normocephalic.  Right Ear: External ear normal.  Left Ear: External ear normal.  Nose: Nose normal.  Mouth/Throat: Oropharynx is clear and moist.  Eyes: Conjunctivae are normal. Pupils are equal, round, and reactive to light. Right eye exhibits no discharge. Left eye exhibits no discharge.  Neck: Normal range of motion. Neck supple. No JVD present. No tracheal deviation present. No thyromegaly present.  Cardiovascular: Normal rate, regular rhythm and normal heart sounds.   Pulmonary/Chest: No stridor. No respiratory distress. She has no wheezes.  Abdominal: Soft. Bowel sounds are normal. She exhibits no distension and no mass. There is no tenderness. There is no rebound and no guarding.  Musculoskeletal: She exhibits no edema  (trace B) and no tenderness.  Lymphadenopathy:    She has no cervical adenopathy.  Neurological: She displays normal reflexes. No cranial nerve deficit. She exhibits normal muscle tone. Coordination normal.       Face tic  Skin: No rash noted. No erythema.  Psychiatric: She has a normal mood and affect. Her behavior is normal. Judgment and thought content normal.          Assessment & Plan:

## 2011-08-25 NOTE — Assessment & Plan Note (Signed)
Added Losartan today

## 2011-08-25 NOTE — Assessment & Plan Note (Signed)
Stable - cont Rx

## 2011-10-18 ENCOUNTER — Telehealth: Payer: Self-pay | Admitting: Internal Medicine

## 2011-10-18 NOTE — Telephone Encounter (Signed)
The patient is hoping to get a refill of her blood pressure medicine.  She states she went to the pharmacy and they told her they could not refill the rx due to it being "too early" according to her insurance. Thanks

## 2011-10-18 NOTE — Telephone Encounter (Signed)
Called pt to see what med is needed. No answer/machine.

## 2011-10-19 NOTE — Telephone Encounter (Signed)
Not sure - ok to fill early Thx

## 2011-10-19 NOTE — Telephone Encounter (Signed)
Left mess with a female for pt to call back.

## 2011-10-20 MED ORDER — ATENOLOL-CHLORTHALIDONE 100-25 MG PO TABS: 1.0000 | ORAL_TABLET | Freq: Every day | ORAL | Status: DC

## 2011-10-20 NOTE — Telephone Encounter (Signed)
Spoke to pt- she needs Tenoretic. Rf sent.

## 2011-11-02 DIAGNOSIS — H2510 Age-related nuclear cataract, unspecified eye: Secondary | ICD-10-CM | POA: Diagnosis not present

## 2011-11-02 DIAGNOSIS — H4011X0 Primary open-angle glaucoma, stage unspecified: Secondary | ICD-10-CM | POA: Diagnosis not present

## 2011-11-02 DIAGNOSIS — H1045 Other chronic allergic conjunctivitis: Secondary | ICD-10-CM | POA: Diagnosis not present

## 2011-11-02 DIAGNOSIS — H01139 Eczematous dermatitis of unspecified eye, unspecified eyelid: Secondary | ICD-10-CM | POA: Diagnosis not present

## 2011-11-10 DIAGNOSIS — H26499 Other secondary cataract, unspecified eye: Secondary | ICD-10-CM | POA: Diagnosis not present

## 2011-11-24 ENCOUNTER — Ambulatory Visit: Payer: BC Managed Care – PPO | Admitting: Internal Medicine

## 2011-12-13 DIAGNOSIS — H409 Unspecified glaucoma: Secondary | ICD-10-CM | POA: Diagnosis not present

## 2011-12-13 DIAGNOSIS — H4011X0 Primary open-angle glaucoma, stage unspecified: Secondary | ICD-10-CM | POA: Diagnosis not present

## 2012-06-27 DIAGNOSIS — H4011X0 Primary open-angle glaucoma, stage unspecified: Secondary | ICD-10-CM | POA: Diagnosis not present

## 2012-06-27 DIAGNOSIS — H2510 Age-related nuclear cataract, unspecified eye: Secondary | ICD-10-CM | POA: Diagnosis not present

## 2012-06-27 DIAGNOSIS — H1045 Other chronic allergic conjunctivitis: Secondary | ICD-10-CM | POA: Diagnosis not present

## 2012-07-11 HISTORY — PX: CATARACT EXTRACTION W/ INTRAOCULAR LENS IMPLANT: SHX1309

## 2012-08-03 DIAGNOSIS — R259 Unspecified abnormal involuntary movements: Secondary | ICD-10-CM | POA: Diagnosis not present

## 2012-09-03 ENCOUNTER — Other Ambulatory Visit: Payer: Self-pay | Admitting: Internal Medicine

## 2012-10-31 ENCOUNTER — Other Ambulatory Visit: Payer: Self-pay | Admitting: *Deleted

## 2012-10-31 MED ORDER — ATENOLOL-CHLORTHALIDONE 100-25 MG PO TABS: 1.0000 | ORAL_TABLET | Freq: Every day | ORAL | Status: DC

## 2012-11-02 ENCOUNTER — Encounter: Payer: Self-pay | Admitting: Internal Medicine

## 2012-11-02 ENCOUNTER — Ambulatory Visit (INDEPENDENT_AMBULATORY_CARE_PROVIDER_SITE_OTHER): Payer: Medicare Other | Admitting: Internal Medicine

## 2012-11-02 VITALS — BP 140/90 | HR 76 | Temp 98.3°F | Resp 16 | Wt 174.0 lb

## 2012-11-02 DIAGNOSIS — E785 Hyperlipidemia, unspecified: Secondary | ICD-10-CM

## 2012-11-02 DIAGNOSIS — F959 Tic disorder, unspecified: Secondary | ICD-10-CM | POA: Diagnosis not present

## 2012-11-02 DIAGNOSIS — I10 Essential (primary) hypertension: Secondary | ICD-10-CM

## 2012-11-02 MED ORDER — ATENOLOL-CHLORTHALIDONE 100-25 MG PO TABS: 1.0000 | ORAL_TABLET | Freq: Every day | ORAL | Status: DC

## 2012-11-02 MED ORDER — LOSARTAN POTASSIUM 100 MG PO TABS: 100.0000 mg | ORAL_TABLET | Freq: Every day | ORAL | Status: DC

## 2012-11-02 NOTE — Progress Notes (Signed)
  Subjective:    HPI  The patient presents for a follow-up of  chronic hypertension, chronic dyslipidemia, tic    Review of Systems  Constitutional: Negative for chills, activity change, appetite change, fatigue and unexpected weight change.  HENT: Negative for congestion, mouth sores and sinus pressure.   Eyes: Negative for visual disturbance.  Respiratory: Negative for cough and chest tightness.   Gastrointestinal: Negative for nausea and abdominal pain.  Genitourinary: Negative for frequency, difficulty urinating and vaginal pain.  Musculoskeletal: Negative for back pain and gait problem.  Skin: Negative for pallor and rash.  Neurological: Negative for dizziness, tremors, weakness, numbness and headaches.  Psychiatric/Behavioral: Negative for confusion and sleep disturbance.   BP Readings from Last 3 Encounters:  11/02/12 140/90  08/25/11 150/90  06/23/11 190/110   Wt Readings from Last 3 Encounters:  11/02/12 174 lb (78.926 kg)  08/25/11 168 lb (76.204 kg)  06/23/11 167 lb (75.751 kg)        Objective:   Physical Exam  Constitutional: She appears well-developed and well-nourished. No distress.  HENT:  Head: Normocephalic.  Right Ear: External ear normal.  Left Ear: External ear normal.  Nose: Nose normal.  Mouth/Throat: Oropharynx is clear and moist.  Eyes: Conjunctivae are normal. Pupils are equal, round, and reactive to light. Right eye exhibits no discharge. Left eye exhibits no discharge.  Neck: Normal range of motion. Neck supple. No JVD present. No tracheal deviation present. No thyromegaly present.  Cardiovascular: Normal rate, regular rhythm and normal heart sounds.   Pulmonary/Chest: No stridor. No respiratory distress. She has no wheezes.  Abdominal: Soft. Bowel sounds are normal. She exhibits no distension and no mass. There is no tenderness. There is no rebound and no guarding.  Musculoskeletal: She exhibits no edema (trace B) and no tenderness.   Lymphadenopathy:    She has no cervical adenopathy.  Neurological: She displays normal reflexes. No cranial nerve deficit. She exhibits normal muscle tone. Coordination normal.  Face tic  Skin: No rash noted. No erythema.  Psychiatric: She has a normal mood and affect. Her behavior is normal. Judgment and thought content normal.     Lab Results  Component Value Date   WBC 5.3 08/25/2011   HGB 14.3 08/25/2011   HCT 42.8 08/25/2011   PLT 284.0 08/25/2011   GLUCOSE 92 08/25/2011   CHOL 226* 08/25/2011   TRIG 79.0 08/25/2011   HDL 68.30 08/25/2011   LDLDIRECT 140.5 08/25/2011   ALT 24 08/25/2011   AST 24 08/25/2011   NA 140 08/25/2011   K 3.8 08/25/2011   CL 102 08/25/2011   CREATININE 0.8 08/25/2011   BUN 17 08/25/2011   CO2 29 08/25/2011   TSH 3.87 08/25/2011        Assessment & Plan:

## 2012-11-02 NOTE — Assessment & Plan Note (Signed)
Continue with current prescription therapy as reflected on the Med list.  

## 2013-02-07 ENCOUNTER — Ambulatory Visit: Payer: Self-pay | Admitting: Gynecology

## 2013-02-14 ENCOUNTER — Encounter: Payer: Self-pay | Admitting: Gynecology

## 2013-02-14 ENCOUNTER — Ambulatory Visit (INDEPENDENT_AMBULATORY_CARE_PROVIDER_SITE_OTHER): Payer: Medicare Other | Admitting: Gynecology

## 2013-02-14 ENCOUNTER — Other Ambulatory Visit: Payer: Self-pay | Admitting: Gynecology

## 2013-02-14 ENCOUNTER — Other Ambulatory Visit (HOSPITAL_COMMUNITY)
Admission: RE | Admit: 2013-02-14 | Discharge: 2013-02-14 | Disposition: A | Discharge status: Home / Self Care | Payer: Medicare Other | Source: Ambulatory Visit | Attending: Gynecology | Admitting: Gynecology

## 2013-02-14 VITALS — BP 140/86 | Ht 66.75 in | Wt 165.0 lb

## 2013-02-14 DIAGNOSIS — N942 Vaginismus: Secondary | ICD-10-CM | POA: Diagnosis not present

## 2013-02-14 DIAGNOSIS — Z1231 Encounter for screening mammogram for malignant neoplasm of breast: Secondary | ICD-10-CM

## 2013-02-14 DIAGNOSIS — N951 Menopausal and female climacteric states: Secondary | ICD-10-CM

## 2013-02-14 DIAGNOSIS — Z780 Asymptomatic menopausal state: Secondary | ICD-10-CM

## 2013-02-14 DIAGNOSIS — N930 Postcoital and contact bleeding: Secondary | ICD-10-CM | POA: Diagnosis not present

## 2013-02-14 DIAGNOSIS — R87810 Cervical high risk human papillomavirus (HPV) DNA test positive: Secondary | ICD-10-CM | POA: Diagnosis not present

## 2013-02-14 DIAGNOSIS — Z124 Encounter for screening for malignant neoplasm of cervix: Secondary | ICD-10-CM | POA: Insufficient documentation

## 2013-02-14 DIAGNOSIS — N952 Postmenopausal atrophic vaginitis: Secondary | ICD-10-CM | POA: Diagnosis not present

## 2013-02-14 DIAGNOSIS — R87619 Unspecified abnormal cytological findings in specimens from cervix uteri: Secondary | ICD-10-CM | POA: Diagnosis not present

## 2013-02-14 MED ORDER — NONFORMULARY OR COMPOUNDED ITEM: Status: DC

## 2013-02-14 NOTE — Progress Notes (Signed)
Laurie Pratt 1941/05/20 [id number]   History:    72 y.o.  New patient to the practice who was complaining of recent postcoital bleeding when she became sexually active once again. Patient has not had a gynecological exam or Pap smear in over 3-4 years. She stated that she has always had normal Pap smears. Her last mammogram report to be normal in 2010. Patient describes doing her monthly breast exam. Patient would know prior history of bone density study. She stated she had a normal colonoscopy in 2011. She is currently taking her calcium with vitamin D. Her PCP Dr. Alain Marion  Has been doing her lab work and treating her for hypertension and hyperlipidemia.  Past medical history,surgical history, family history and social history were all reviewed and documented in the EPIC chart.  Gynecologic History No LMP recorded. Patient is postmenopausal. Contraception: post menopausal status Last Pap: 2011. Results were: normal Last mammogram: 2010. Results were: normal  Obstetric History OB History   Grav Para Term Preterm Abortions TAB SAB Ect Mult Living   4 3   1  1   3      # Outc Date GA Lbr Len/2nd Wgt Sex Del Anes PTL Lv   1 PAR            2 PAR            3 PAR            4 SAB                ROS: A ROS was performed and pertinent positives and negatives are included in the history.  GENERAL: No fevers or chills. HEENT: No change in vision, no earache, sore throat or sinus congestion. NECK: No pain or stiffness. CARDIOVASCULAR: No chest pain or pressure. No palpitations. PULMONARY: No shortness of breath, cough or wheeze. GASTROINTESTINAL: No abdominal pain, nausea, vomiting or diarrhea, melena or bright red blood per rectum. GENITOURINARY: No urinary frequency, urgency, hesitancy or dysuria. MUSCULOSKELETAL: No joint or muscle pain, no back pain, no recent trauma. DERMATOLOGIC: No rash, no itching, no lesions. ENDOCRINE: No polyuria, polydipsia, no heat or cold intolerance. No recent  change in weight. HEMATOLOGICAL: No anemia or easy bruising or bleeding. NEUROLOGIC: No headache, seizures, numbness, tingling or weakness. PSYCHIATRIC: No depression, no loss of interest in normal activity or change in sleep pattern.     Exam: chaperone present  BP 140/86  Ht 5' 6.75" (1.695 m)  Wt 165 lb (74.844 kg)  BMI 26.05 kg/m2  Body mass index is 26.05 kg/(m^2).  General appearance : Well developed well nourished female. No acute distress HEENT: Neck supple, trachea midline, no carotid bruits, no thyroidmegaly Lungs: Clear to auscultation, no rhonchi or wheezes, or rib retractions  Heart: Regular rate and rhythm, no murmurs or gallops Breast:Examined in sitting and supine position were symmetrical in appearance, no palpable masses or tenderness,  no skin retraction, no nipple inversion, no nipple discharge, no skin discoloration, no axillary or supraclavicular lymphadenopathy Abdomen: no palpable masses or tenderness, no rebound or guarding Extremities: no edema or skin discoloration or tenderness  Pelvic:  Bartholin, Urethra, Skene Glands: Within normal limits             Vagina: atrophic and friable on contact  Cervix:flat and friable  Uterus  Limited due to patient's vaginismus  Adnexa Limited due to patient vaginismus Anus and perineum  normal   Rectovaginal  normal sphincter tone without palpated masses or tenderness  Hemoccult card provided     Assessment/Plan:  72 y.o. female with the vaginal atrophy contributing to her friability of her vaginal mucosa and cervix and would explain her recent postcoital bleeding. Pap smear was done today. Patient will be placed on vaginal estrogen (estradiol 0.02%) to apply twice a week. She'll return back to the office in 1-2 weeks for further evaluation of her uterus and adnexa since exam today was limited. Patient denies any prior history of cervical dysplasia or any surgery on her cervix? Patient will be scheduled and her  mammogram as well as her bone density study. We discussed importance of calcium and vitamin D for osteoporosis prevention.    Terrance Mass MD, 12:21 PM 02/14/2013

## 2013-02-14 NOTE — Patient Instructions (Addendum)
Bone Densitometry Bone densitometry is a special X-ray that measures your bone density and can be used to help predict your risk of bone fractures. This test is used to determine bone mineral content and density to diagnose osteoporosis. Osteoporosis is the loss of bone that may cause the bone to become weak. Osteoporosis commonly occurs in women entering menopause. However, it may be found in men and in people with other diseases. PREPARATION FOR TEST No preparation necessary. WHO SHOULD BE TESTED?  All women older than 105.  Postmenopausal women (50 to 71) with risk factors for osteoporosis.  People with a previous fracture caused by normal activities.  People with a small body frame (less than 127 poundsor a body mass index [BMI] of less than 21).  People who have a parent with a hip fracture or history of osteoporosis.  People who smoke.  People who have rheumatoid arthritis.  Anyone who engages in excessive alcohol use (more than 3 drinks most days).  Women who experience early menopause. WHEN SHOULD YOU BE RETESTED? Current guidelines suggest that you should wait at least 2 years before doing a bone density test again if your first test was normal.Recent studies indicated that women with normal bone density may be able to wait a few years before needing to repeat a bone density test. You should discuss this with your caregiver.  NORMAL FINDINGS   Normal: less than standard deviation below normal (greater than -1).  Osteopenia: 1 to 2.5 standard deviations below normal (-1 to -2.5).  Osteoporosis: greater than 2.5 standard deviations below normal (less than -2.5). Test results are reported as a "T score" and a "Z score."The T score is a number that compares your bone density with the bone density of healthy, young women.The Z score is a number that compares your bone density with the scores of women who are the same age, gender, and race.  Ranges for normal findings may vary  among different laboratories and hospitals. You should always check with your doctor after having lab work or other tests done to discuss the meaning of your test results and whether your values are considered within normal limits. MEANING OF TEST  Your caregiver will go over the test results with you and discuss the importance and meaning of your results, as well as treatment options and the need for additional tests if necessary. OBTAINING THE TEST RESULTS It is your responsibility to obtain your test results. Ask the lab or department performing the test when and how you will get your results. Document Released: 07/19/2004 Document Revised: 09/19/2011 Document Reviewed: 08/11/2010 [facility] Patient Information 2014 [place]. Transvaginal Ultrasound Transvaginal ultrasound is a pelvic ultrasound, using a metal probe that is placed in the vagina, to look at a women's female organs. Transvaginal ultrasound is a method of seeing inside the pelvis of a woman. The ultrasound machine sends out sound waves from the transducer (probe). These sound waves bounce off body structures (like an echo) to create a picture. The picture shows up on a monitor. It is called transvaginal because the probe is inserted into the vagina. There should be very little discomfort from the vaginal probe. This test can also be used during pregnancy. Endovaginal ultrasound is another name for a transvaginal ultrasound. In a transabdominal ultrasound, the probe is placed on the outside of the belly. This method gives pictures that are lower quality than pictures from the transvaginal technique. Transvaginal ultrasound is used to look for problems of the female genital  tract. Some such problems include:  Infertility problems.  Congenital (birth defect) malformations of the uterus and ovaries.  Tumors in the uterus.  Abnormal bleeding.  Ovarian tumors and cysts.  Abscess (inflamed tissue around pus) in the  pelvis.  Unexplained abdominal or pelvic pain.  Pelvic infection. DURING PREGNANCY, TRANSVAGINAL ULTRASOUND MAY BE USED TO LOOK AT:  Normal pregnancy.  Ectopic pregnancy (pregnancy outside the uterus).  Fetal heartbeat.  Abnormalities in the pelvis, that are not seen well with transabdominal ultrasound.  Suspected twins or multiples.  Impending miscarriage.  Problems with the cervix (incompetent cervix, not able to stay closed and hold the baby).  When doing an amniocentesis (removing fluid from the pregnancy sac, for testing).  Looking for abnormalities of the baby.  Checking the growth, development, and age of the fetus.  Measuring the amount of fluid in the amniotic sac.  When doing an external version of the baby (moving baby into correct position).  Evaluating the baby for problems in high risk pregnancies (biophysical profile).  Suspected fetal demise (death). Sometimes a special ultrasound method called Saline Infusion Sonography (SIS) is used for a more accurate look at the uterus. Sterile saline (salt water) is injected into the uterus of non-pregnant patients to see the inside of the uterus better. SIS is not used on pregnant women. The vaginal probe can also assist in obtaining biopsies of abnormal areas, in draining fluid from cysts on the ovary, and in finding IUDs (intrauterine device, birth control) that cannot be located. PREPARATION FOR TEST A transvaginal ultrasound is done with the bladder empty. The transabdominal ultrasound is done with your bladder full. You may be asked to drink several glasses of water before that exam. Sometimes, a transabdominal ultrasound is done just after a transvaginal ultrasound, to look at organs in your abdomen. PROCEDURE  You will lie down on a table, with your knees bent and your feet in foot holders. The probe is covered with a condom. A sterile lubricant is put into the vagina and on the probe. The lubricant helps transmit  the sound waves and avoid irritating the vagina. Your caregiver will move the probe inside the vaginal cavity to scan the pelvic structures. A normal test will show a normal pelvis and normal contents. An abnormal test will show abnormalities of the pelvis, placenta, or baby. ABNORMAL RESULTS MAY BE DUE TO:  Growths or tumors in the:  Uterus.  Ovaries.  Vagina.  Other pelvic structures.  Non-cancerous growths of the uterus and ovaries.  Twisting of the ovary, cutting off blood supply to the ovary (ovarian torsion).  Areas of infection, including:  Pelvic inflammatory disease.  Abscess in the pelvis.  Locating an IUD. PROBLEMS FOUND IN PREGNANT WOMEN MAY INCLUDE:  Ectopic pregnancy (pregnancy outside the uterus).  Multiple pregnancies.  Early dilation (opening) of the cervix. This may indicate an incompetent cervix and early delivery.  Impending miscarriage.  Fetal death.  Problems with the placenta, including:  Placenta has grown over the opening of the womb (placenta previa).  Placenta has separated early in the womb (placental abruption).  Placenta grows into the muscle of the uterus (placenta accreta).  Tumors of pregnancy, including gestational trophoblastic disease. This is an abnormal pregnancy, with no fetus. The uterus is filled with many grape-like cysts that could sometimes be cancerous.  Incorrect position of the fetus (breech, vertex).  Intrauterine fetal growth retardation (IUGR) (poor growth in the womb).  Fetal abnormalities or infection. RISKS AND COMPLICATIONS There are no  known risks to the ultrasound procedure. There is no X-ray used when doing an ultrasound. Document Released: 06/08/2004 Document Revised: 09/19/2011 Document Reviewed: 05/27/2009 [facility] Patient Information 2014 [place], [place].

## 2013-02-21 ENCOUNTER — Telehealth: Payer: Self-pay | Admitting: Gynecology

## 2013-02-21 ENCOUNTER — Telehealth: Payer: Self-pay | Admitting: *Deleted

## 2013-02-21 NOTE — Telephone Encounter (Signed)
JF asked me make appointment for pt at [facility] due to new Dx. Squamous Cell Carcinoma. appt on 03/01/13 @ 8:15 am with Dr.Clark pearson.

## 2013-02-21 NOTE — Telephone Encounter (Signed)
Telephone conversation with patient today. Patient was seen as a new patient in the office on August 7. Patient had complained of recent postcoital bleeding when she recently became sexually active once again. Patient had not had a gynecological examination or Pap smear in over 4 years. She does state that prior to that her Pap smear had been normal. Please see office note dated August 7 for additional details.  She was contacted because her Pap smear done at that office visit demonstrated the following:  Satisfactory for evaluation, endocervical/transformation zone component PRESENT. Diagnosis SQUAMOUS CELL CARCINOMA.  Specimen Clinical Information Postmenopausal Source CervicoVaginal Pap [ThinPrep Imaged] Ancillary Testing HPV 16,18/45 Genotyping NEGATIVE for HPV 16 & 18/45 Completed by [id number] on 2013-02-20 HPV High Risk **DETECTED**  The above information was relayed to the patient by telephone. She will be referred to my GYN oncology colleague Dr. Charlaine Dalton for which an appointment has been made for her for August 22. He will be able to see my notes and will be able to review her slides as well.

## 2013-02-25 ENCOUNTER — Other Ambulatory Visit: Payer: Medicare Other

## 2013-02-25 ENCOUNTER — Ambulatory Visit: Payer: Medicare Other | Admitting: Gynecology

## 2013-02-26 ENCOUNTER — Encounter: Payer: Self-pay | Admitting: Gynecology

## 2013-02-26 ENCOUNTER — Ambulatory Visit: Payer: Medicare Other | Attending: Gynecology | Admitting: Gynecology

## 2013-02-26 VITALS — BP 182/80 | HR 96 | Temp 98.5°F | Resp 20 | Ht 66.77 in | Wt 177.4 lb

## 2013-02-26 DIAGNOSIS — E785 Hyperlipidemia, unspecified: Secondary | ICD-10-CM | POA: Insufficient documentation

## 2013-02-26 DIAGNOSIS — R87613 High grade squamous intraepithelial lesion on cytologic smear of cervix (HGSIL): Secondary | ICD-10-CM | POA: Diagnosis not present

## 2013-02-26 DIAGNOSIS — N72 Inflammatory disease of cervix uteri: Secondary | ICD-10-CM | POA: Insufficient documentation

## 2013-02-26 DIAGNOSIS — R87619 Unspecified abnormal cytological findings in specimens from cervix uteri: Secondary | ICD-10-CM | POA: Diagnosis not present

## 2013-02-26 DIAGNOSIS — IMO0002 Reserved for concepts with insufficient information to code with codable children: Secondary | ICD-10-CM

## 2013-02-26 DIAGNOSIS — Z79899 Other long term (current) drug therapy: Secondary | ICD-10-CM | POA: Insufficient documentation

## 2013-02-26 DIAGNOSIS — I10 Essential (primary) hypertension: Secondary | ICD-10-CM | POA: Diagnosis not present

## 2013-02-26 DIAGNOSIS — N898 Other specified noninflammatory disorders of vagina: Secondary | ICD-10-CM | POA: Diagnosis not present

## 2013-02-26 DIAGNOSIS — N950 Postmenopausal bleeding: Secondary | ICD-10-CM | POA: Diagnosis not present

## 2013-02-26 DIAGNOSIS — C539 Malignant neoplasm of cervix uteri, unspecified: Secondary | ICD-10-CM

## 2013-02-26 DIAGNOSIS — D390 Neoplasm of uncertain behavior of uterus: Secondary | ICD-10-CM | POA: Diagnosis not present

## 2013-02-26 NOTE — Patient Instructions (Addendum)
We will contact you with the biopsy report.

## 2013-02-26 NOTE — Progress Notes (Signed)
Consult Note: Gyn-Onc   Laurie Pratt 72 y.o. female  Chief Complaint  Patient presents with  . Cervical Carcinoma    New Consult    Assessment : Abnormal uterine bleeding and a recent Pap smear suspicious for squamous cell carcinoma.  Plan: We will await the biopsy report before making further treatment recommendations. If the biopsy is negative or inconclusive, bleeding the patient should be managed with a conization and examination under anesthesia.     HPI: 72 year old African American female seen in consultation at the request of Dr. Uvaldo Rising regarding management of a Pap smear recently obtained suspicious for squamous cell carcinoma.  The patient presented to Dr. Toney Rakes with abnormal uterine bleeding. The cervix was described as friable and a Pap smear was obtained with the above-noted results.  The patient claims she had a last Pap smear 2 years ago which was normal. She claims that the postmenopausal bleeding started approximately a month ago. She denies any pelvic pain or any GI or GU symptoms. She denies any past gynecologic history. Obstetrical history gravida 3.  Review of Systems:10 point review of systems is negative except as noted in interval history.   Vitals: Blood pressure 182/80, pulse 96, temperature 98.5 F (36.9 C), temperature source Oral, resp. rate 20, height 5' 6.77" (1.696 m), weight 177 lb 6.4 oz (80.468 kg).  Physical Exam: General : The patient is a healthy woman in no acute distress.  HEENT: normocephalic, extraoccular movements normal; neck is supple without thyromegally  Lynphnodes: Supraclavicular and inguinal nodes not enlarged  Abdomen: Soft, non-tender, no ascites, no organomegally, no masses, no hernias  Pelvic:  EGBUS: Normal female  Vagina: Atrophic but with no lesions.  Urethra and Bladder: Normal, non-tender  Cervix: Flush with the upper vagina. It is friable and I do not see any gross lesions and on palpation is not firm or  hard.  Uterus: Difficult to outline  Bi-manual examination: Non-tender; no adenxal masses or nodularity  Rectal: normal sphincter tone, no masses, no blood  Lower extremities: No edema or varicosities. Normal range of motion   Procedure note: After verbal informed consent was given, a directed biopsy of a friable area in the cervix or upper vagina was taken. Silver nitrate was used to control bleeding. The patient tolerated procedure well.     Allergies  Allergen Reactions  . Ace Inhibitors   . Doxazosin Mesylate   . Verapamil     REACTION: legs swelling, tired    Past Medical History  Diagnosis Date  . Hypertension   . Tics of organic origin     Dr. Doy Mince  . Hyperlipidemia     History reviewed. No pertinent past surgical history.  Current Outpatient Prescriptions  Medication Sig Dispense Refill  . atenolol-chlorthalidone (TENORETIC) 100-25 MG per tablet Take 1 tablet by mouth daily.  90 tablet  3  . cholecalciferol (VITAMIN D) 1000 UNITS tablet Take 1 tablet (1,000 Units total) by mouth daily.  100 tablet  3  . losartan (COZAAR) 100 MG tablet Take 1 tablet (100 mg total) by mouth daily.  90 tablet  3  . lovastatin (MEVACOR) 20 MG tablet Take 1 tablet (20 mg total) by mouth at bedtime.  90 tablet  3  . NONFORMULARY OR COMPOUNDED ITEM Estradiol .02% 1 ML Prefilled Applicator Sig: apply vaginally twice a week #90 Day Supply with 4 refills  1 each  4  . potassium chloride SA (K-DUR,KLOR-CON) 20 MEQ tablet Take 1 tablet (20 mEq total)  by mouth daily.  90 tablet  3  . trihexyphenidyl (ARTANE) 2 MG tablet Take 1.5 tablets (3 mg total) by mouth 3 (three) times daily with meals.  405 tablet  3  . amLODipine (NORVASC) 10 MG tablet Take 1 tablet (10 mg total) by mouth daily.  30 tablet  11   No current facility-administered medications for this visit.    History   Social History  . Marital Status: Widowed    Spouse Name: N/A    Number of Children: N/A  . Years of  Education: N/A   Occupational History  . Not on file.   Social History Main Topics  . Smoking status: Never Smoker   . Smokeless tobacco: Not on file  . Alcohol Use: No  . Drug Use: No  . Sexual Activity: Yes   Other Topics Concern  . Not on file   Social History Narrative  . No narrative on file    Family History  Problem Relation Age of Onset  . Hypertension Other   . Hypertension Mother   . Diabetes Father   . Hypertension Father   . Heart disease Maternal Aunt     CHF      CLARKE-PEARSON,Lavonya Hoerner L, MD 02/26/2013, 11:18 AM

## 2013-03-01 ENCOUNTER — Telehealth: Payer: Self-pay | Admitting: Gynecologic Oncology

## 2013-03-01 NOTE — Telephone Encounter (Signed)
Patient informed of biopsy results and Dr. Mora Bellman recommendations for exam under anesthesia and cone biopsy on Sept. 2.  Verbalizing understanding.

## 2013-03-04 ENCOUNTER — Encounter (HOSPITAL_COMMUNITY): Payer: Self-pay | Admitting: Pharmacy Technician

## 2013-03-04 NOTE — Progress Notes (Signed)
Laurie Pratt-  Need PRE OP ORDERS when you can- appt 03/07/13  [facility]

## 2013-03-06 ENCOUNTER — Ambulatory Visit
Admission: RE | Admit: 2013-03-06 | Discharge: 2013-03-06 | Disposition: A | Discharge status: Home / Self Care | Payer: Medicare Other | Source: Ambulatory Visit | Attending: Gynecology | Admitting: Gynecology

## 2013-03-06 ENCOUNTER — Other Ambulatory Visit (HOSPITAL_COMMUNITY): Payer: Self-pay | Admitting: Gynecology

## 2013-03-06 DIAGNOSIS — Z1231 Encounter for screening mammogram for malignant neoplasm of breast: Secondary | ICD-10-CM | POA: Diagnosis not present

## 2013-03-06 NOTE — Patient Instructions (Addendum)
[place]  03/06/2013   Your procedure is scheduled on: 03-12-2013  Report to [place] at 615  AM.  Call this number if you have problems the morning of surgery [phone]   Remember:   Do not eat food or drink liquids :After Midnight.     Take these medicines the morning of surgery with A SIP OF WATER: none                                SEE [facility] PREPARING FOR SURGERY SHEET   Do not wear jewelry, make-up or nail polish.  Do not wear lotions, powders, or perfumes. You may wear deodorant.   Men may shave face and neck.  Do not bring valuables to the hospital. [place].  Contacts, dentures or bridgework may not be worn into surgery.  Leave suitcase in the car. After surgery it may be brought to your room.  For patients admitted to the hospital, checkout time is 11:00 AM the day of discharge.   Patients discharged the day of surgery will not be allowed to drive home.  Name and phone number of your driver: Laurie Pratt  Special Instructions: N/A   Please read over the following fact sheets that you were given: incentive spirometer fact sheet  Call Keith Rake RN pre op nurse if needed [phone]    [place].  PATIENT SIGNATURE___________________________________________  NURSE SIGNATURE_____________________________________________

## 2013-03-07 ENCOUNTER — Encounter (HOSPITAL_COMMUNITY): Payer: Self-pay

## 2013-03-07 ENCOUNTER — Encounter (HOSPITAL_COMMUNITY)
Admission: RE | Admit: 2013-03-07 | Discharge: 2013-03-07 | Disposition: A | Discharge status: Home / Self Care | Payer: Medicare Other | Source: Ambulatory Visit | Attending: Gynecology | Admitting: Gynecology

## 2013-03-07 ENCOUNTER — Ambulatory Visit (HOSPITAL_COMMUNITY)
Admission: RE | Admit: 2013-03-07 | Discharge: 2013-03-07 | Disposition: A | Discharge status: Home / Self Care | Payer: Medicare Other | Source: Ambulatory Visit | Attending: Gynecology | Admitting: Gynecology

## 2013-03-07 DIAGNOSIS — Z01810 Encounter for preprocedural cardiovascular examination: Secondary | ICD-10-CM | POA: Diagnosis not present

## 2013-03-07 DIAGNOSIS — Z01818 Encounter for other preprocedural examination: Secondary | ICD-10-CM | POA: Diagnosis not present

## 2013-03-07 DIAGNOSIS — R9431 Abnormal electrocardiogram [ECG] [EKG]: Secondary | ICD-10-CM | POA: Insufficient documentation

## 2013-03-07 DIAGNOSIS — R87619 Unspecified abnormal cytological findings in specimens from cervix uteri: Secondary | ICD-10-CM | POA: Insufficient documentation

## 2013-03-07 DIAGNOSIS — Z01812 Encounter for preprocedural laboratory examination: Secondary | ICD-10-CM | POA: Insufficient documentation

## 2013-03-07 LAB — CBC
MCH: 28.4 pg (ref 26.0–34.0)
MCHC: 33.7 g/dL (ref 30.0–36.0)
MCV: 84.4 fL (ref 78.0–100.0)
Platelets: 305 10*3/uL (ref 150–400)
RDW: 13.9 % (ref 11.5–15.5)
WBC: 5 10*3/uL (ref 4.0–10.5)

## 2013-03-07 LAB — BASIC METABOLIC PANEL
BUN: 13 mg/dL (ref 6–23)
CO2: 31 mEq/L (ref 19–32)
Calcium: 9.9 mg/dL (ref 8.4–10.5)
Creatinine, Ser: 1.03 mg/dL (ref 0.50–1.10)

## 2013-03-07 NOTE — Progress Notes (Signed)
Patient signed refusal for blood products as patient is a Restaurant manager, fast food. Copy Faxed to blood bank at [facility].

## 2013-03-08 NOTE — Progress Notes (Signed)
NPO AFTER MN. ARRIVES AT 0700. CURRENT LAB RESULTS AND EKG IN EPIC AND CHART. PT IS DOING HIBICLENS SHOWER PER INSTRUCTIONS FROM SHORT STAY.  PT WAS MOVED TO THIS FACILITY FROM MAIN [facility].  I SPOKE DIRECTLY WITH PT VIA WITH INSTRUCTIONS.

## 2013-03-12 ENCOUNTER — Encounter (HOSPITAL_BASED_OUTPATIENT_CLINIC_OR_DEPARTMENT_OTHER): Payer: Self-pay | Admitting: Certified Registered"

## 2013-03-12 ENCOUNTER — Ambulatory Visit (HOSPITAL_BASED_OUTPATIENT_CLINIC_OR_DEPARTMENT_OTHER): Payer: Medicare Other | Admitting: Anesthesiology

## 2013-03-12 ENCOUNTER — Ambulatory Visit (HOSPITAL_COMMUNITY): Admission: RE | Admit: 2013-03-12 | Payer: Medicare Other | Source: Ambulatory Visit | Admitting: Gynecology

## 2013-03-12 ENCOUNTER — Encounter (HOSPITAL_BASED_OUTPATIENT_CLINIC_OR_DEPARTMENT_OTHER): Payer: Self-pay | Admitting: *Deleted

## 2013-03-12 ENCOUNTER — Encounter (HOSPITAL_COMMUNITY): Admission: RE | Payer: Self-pay | Source: Ambulatory Visit

## 2013-03-12 ENCOUNTER — Ambulatory Visit (HOSPITAL_BASED_OUTPATIENT_CLINIC_OR_DEPARTMENT_OTHER): Payer: Medicare Other | Admitting: Certified Registered"

## 2013-03-12 ENCOUNTER — Encounter (HOSPITAL_BASED_OUTPATIENT_CLINIC_OR_DEPARTMENT_OTHER)
Admission: RE | Disposition: A | Discharge status: Home / Self Care | Payer: Self-pay | Source: Ambulatory Visit | Attending: Gynecology

## 2013-03-12 ENCOUNTER — Ambulatory Visit (HOSPITAL_BASED_OUTPATIENT_CLINIC_OR_DEPARTMENT_OTHER)
Admission: RE | Admit: 2013-03-12 | Discharge: 2013-03-12 | Disposition: A | Discharge status: Home / Self Care | Payer: Medicare Other | Source: Ambulatory Visit | Attending: Gynecology | Admitting: Gynecology

## 2013-03-12 ENCOUNTER — Encounter (HOSPITAL_BASED_OUTPATIENT_CLINIC_OR_DEPARTMENT_OTHER): Payer: Self-pay | Admitting: Anesthesiology

## 2013-03-12 ENCOUNTER — Ambulatory Visit (HOSPITAL_BASED_OUTPATIENT_CLINIC_OR_DEPARTMENT_OTHER): Admit: 2013-03-12 | Payer: Self-pay | Admitting: Gynecology

## 2013-03-12 DIAGNOSIS — IMO0002 Reserved for concepts with insufficient information to code with codable children: Secondary | ICD-10-CM | POA: Diagnosis not present

## 2013-03-12 DIAGNOSIS — Z888 Allergy status to other drugs, medicaments and biological substances status: Secondary | ICD-10-CM | POA: Diagnosis not present

## 2013-03-12 DIAGNOSIS — Z79899 Other long term (current) drug therapy: Secondary | ICD-10-CM | POA: Diagnosis not present

## 2013-03-12 DIAGNOSIS — E785 Hyperlipidemia, unspecified: Secondary | ICD-10-CM | POA: Diagnosis not present

## 2013-03-12 DIAGNOSIS — R87619 Unspecified abnormal cytological findings in specimens from cervix uteri: Secondary | ICD-10-CM | POA: Diagnosis not present

## 2013-03-12 DIAGNOSIS — Y838 Other surgical procedures as the cause of abnormal reaction of the patient, or of later complication, without mention of misadventure at the time of the procedure: Secondary | ICD-10-CM | POA: Insufficient documentation

## 2013-03-12 DIAGNOSIS — F959 Tic disorder, unspecified: Secondary | ICD-10-CM | POA: Diagnosis not present

## 2013-03-12 DIAGNOSIS — Y9210 Unspecified residential institution as the place of occurrence of the external cause: Secondary | ICD-10-CM | POA: Insufficient documentation

## 2013-03-12 DIAGNOSIS — D069 Carcinoma in situ of cervix, unspecified: Secondary | ICD-10-CM | POA: Diagnosis not present

## 2013-03-12 DIAGNOSIS — I10 Essential (primary) hypertension: Secondary | ICD-10-CM | POA: Insufficient documentation

## 2013-03-12 DIAGNOSIS — N952 Postmenopausal atrophic vaginitis: Secondary | ICD-10-CM | POA: Diagnosis not present

## 2013-03-12 HISTORY — PX: CERVICAL CONIZATION W/BX: SHX1330

## 2013-03-12 SURGERY — CONE BIOPSY, CERVIX
Anesthesia: General | Site: Vagina | Wound class: Clean Contaminated

## 2013-03-12 SURGERY — CONE BIOPSY, CERVIX
Anesthesia: General | Site: Uterus | Wound class: Clean Contaminated

## 2013-03-12 SURGERY — CONE BIOPSY, CERVIX
Anesthesia: Monitor Anesthesia Care

## 2013-03-12 MED ORDER — SODIUM CHLORIDE 0.9 % IJ SOLN
3.0000 mL | Freq: Two times a day (BID) | INTRAMUSCULAR | Status: DC
  Filled 2013-03-12: qty 3

## 2013-03-12 MED ORDER — FENTANYL CITRATE 0.05 MG/ML IJ SOLN
25.0000 ug | INTRAMUSCULAR | Status: DC | PRN
  Filled 2013-03-12: qty 1

## 2013-03-12 MED ORDER — FENTANYL CITRATE 0.05 MG/ML IJ SOLN
INTRAMUSCULAR | Status: DC | PRN
  Administered 2013-03-12: 50 ug via INTRAVENOUS

## 2013-03-12 MED ORDER — MIDAZOLAM HCL 5 MG/5ML IJ SOLN
INTRAMUSCULAR | Status: DC | PRN
  Administered 2013-03-12: 1 mg via INTRAVENOUS

## 2013-03-12 MED ORDER — SODIUM CHLORIDE 0.9 % IV SOLN
250.0000 mL | INTRAVENOUS | Status: DC | PRN
  Filled 2013-03-12: qty 250

## 2013-03-12 MED ORDER — LIDOCAINE-EPINEPHRINE 0.5 %-1:200000 IJ SOLN
INTRAMUSCULAR | Status: DC | PRN
  Administered 2013-03-12: 10 mL

## 2013-03-12 MED ORDER — STERILE WATER FOR IRRIGATION IR SOLN
Status: DC | PRN
  Administered 2013-03-12: 500 mL

## 2013-03-12 MED ORDER — KETOROLAC TROMETHAMINE 15 MG/ML IJ SOLN
15.0000 mg | Freq: Four times a day (QID) | INTRAMUSCULAR | Status: DC
  Filled 2013-03-12: qty 1

## 2013-03-12 MED ORDER — SODIUM CHLORIDE 0.9 % IJ SOLN
3.0000 mL | INTRAMUSCULAR | Status: DC | PRN
  Filled 2013-03-12: qty 3

## 2013-03-12 MED ORDER — GLYCOPYRROLATE 0.2 MG/ML IJ SOLN
INTRAMUSCULAR | Status: DC | PRN
  Administered 2013-03-12: 0.2 mg via INTRAVENOUS

## 2013-03-12 MED ORDER — ATENOLOL 100 MG PO TABS
100.0000 mg | ORAL_TABLET | Freq: Once | ORAL | Status: AC
  Administered 2013-03-12: 100 mg via ORAL
  Filled 2013-03-12: qty 1

## 2013-03-12 MED ORDER — ACETAMINOPHEN 325 MG PO TABS
650.0000 mg | ORAL_TABLET | ORAL | Status: DC | PRN
  Filled 2013-03-12: qty 2

## 2013-03-12 MED ORDER — SUCCINYLCHOLINE CHLORIDE 20 MG/ML IJ SOLN
INTRAMUSCULAR | Status: DC | PRN
  Administered 2013-03-12: 100 mg via INTRAVENOUS

## 2013-03-12 MED ORDER — FENTANYL CITRATE 0.05 MG/ML IJ SOLN
25.0000 ug | INTRAMUSCULAR | Status: DC | PRN
  Administered 2013-03-12 (×3): 25 ug via INTRAVENOUS
  Filled 2013-03-12: qty 1

## 2013-03-12 MED ORDER — DEXAMETHASONE SODIUM PHOSPHATE 4 MG/ML IJ SOLN
INTRAMUSCULAR | Status: DC | PRN
  Administered 2013-03-12: 8 mg via INTRAVENOUS

## 2013-03-12 MED ORDER — LIDOCAINE HCL (CARDIAC) 20 MG/ML IV SOLN
INTRAVENOUS | Status: DC | PRN
  Administered 2013-03-12: 60 mg via INTRAVENOUS

## 2013-03-12 MED ORDER — LACTATED RINGERS IV SOLN
INTRAVENOUS | Status: DC
  Administered 2013-03-12 (×3): via INTRAVENOUS
  Filled 2013-03-12: qty 1000

## 2013-03-12 MED ORDER — OXYCODONE HCL 5 MG PO TABS
5.0000 mg | ORAL_TABLET | ORAL | Status: DC | PRN
  Administered 2013-03-12 (×2): 5 mg via ORAL
  Filled 2013-03-12: qty 2

## 2013-03-12 MED ORDER — PROMETHAZINE HCL 25 MG/ML IJ SOLN
6.2500 mg | INTRAMUSCULAR | Status: DC | PRN
  Filled 2013-03-12: qty 1

## 2013-03-12 MED ORDER — DEXAMETHASONE SODIUM PHOSPHATE 4 MG/ML IJ SOLN
INTRAMUSCULAR | Status: DC | PRN
  Administered 2013-03-12: 4 mg via INTRAVENOUS

## 2013-03-12 MED ORDER — EPHEDRINE SULFATE 50 MG/ML IJ SOLN
INTRAMUSCULAR | Status: DC | PRN
  Administered 2013-03-12: 10 mg via INTRAVENOUS

## 2013-03-12 MED ORDER — METOCLOPRAMIDE HCL 5 MG/ML IJ SOLN
INTRAMUSCULAR | Status: DC | PRN
  Administered 2013-03-12: 10 mg via INTRAVENOUS

## 2013-03-12 MED ORDER — ONDANSETRON HCL 4 MG/2ML IJ SOLN
4.0000 mg | Freq: Four times a day (QID) | INTRAMUSCULAR | Status: DC | PRN
  Filled 2013-03-12: qty 2

## 2013-03-12 MED ORDER — PROPOFOL 10 MG/ML IV BOLUS
INTRAVENOUS | Status: DC | PRN
  Administered 2013-03-12: 150 mg via INTRAVENOUS

## 2013-03-12 MED ORDER — PROPOFOL 10 MG/ML IV BOLUS
INTRAVENOUS | Status: DC | PRN
  Administered 2013-03-12: 170 mg via INTRAVENOUS

## 2013-03-12 MED ORDER — ONDANSETRON HCL 4 MG/2ML IJ SOLN
INTRAMUSCULAR | Status: DC | PRN
  Administered 2013-03-12: 4 mg via INTRAVENOUS

## 2013-03-12 MED ORDER — LIDOCAINE HCL (CARDIAC) 20 MG/ML IV SOLN
INTRAVENOUS | Status: DC | PRN
  Administered 2013-03-12: 50 mg via INTRAVENOUS

## 2013-03-12 MED ORDER — EPHEDRINE SULFATE 50 MG/ML IJ SOLN: INTRAMUSCULAR | Status: DC | PRN

## 2013-03-12 MED ORDER — ACETAMINOPHEN 650 MG RE SUPP
650.0000 mg | RECTAL | Status: DC | PRN
  Filled 2013-03-12: qty 1

## 2013-03-12 MED ORDER — LACTATED RINGERS IV SOLN: INTRAVENOUS | Status: DC | PRN

## 2013-03-12 MED ORDER — ACETAMINOPHEN 500 MG PO TABS
1000.0000 mg | ORAL_TABLET | Freq: Four times a day (QID) | ORAL | Status: DC
  Filled 2013-03-12: qty 2

## 2013-03-12 SURGICAL SUPPLY — 40 items
APPLICATOR COTTON TIP 6IN STRL (MISCELLANEOUS) ×4 IMPLANT
BAG DECANTER FOR FLEXI CONT (MISCELLANEOUS) ×2 IMPLANT
BLADE SURG 11 STRL SS (BLADE) ×2 IMPLANT
CANISTER SUCTION 1200CC (MISCELLANEOUS) IMPLANT
CANISTER SUCTION 2500CC (MISCELLANEOUS) IMPLANT
CATH ROBINSON RED A/P 16FR (CATHETERS) ×2 IMPLANT
CLOTH BEACON ORANGE TIMEOUT ST (SAFETY) ×2 IMPLANT
COVER TABLE BACK 60X90 (DRAPES) ×2 IMPLANT
DRAPE LG THREE QUARTER DISP (DRAPES) ×2 IMPLANT
DRAPE UNDERBUTTOCKS STRL (DRAPE) ×2 IMPLANT
DRESSING TELFA 8X3 (GAUZE/BANDAGES/DRESSINGS) ×2 IMPLANT
ELECT BALL LEEP 3MM BLK (ELECTRODE) IMPLANT
ELECT BALL LEEP 5MM RED (ELECTRODE) ×2 IMPLANT
ELECT REM PT RETURN 9FT ADLT (ELECTROSURGICAL) ×2
ELECTRODE REM PT RTRN 9FT ADLT (ELECTROSURGICAL) IMPLANT
GLOVE ECLIPSE 8.0 STRL XLNG CF (GLOVE) ×2 IMPLANT
GOWN PREVENTION PLUS LG XLONG (DISPOSABLE) ×2 IMPLANT
GOWN STRL REIN XL XLG (GOWN DISPOSABLE) ×2 IMPLANT
HEMOSTAT SURGICEL 2X14 (HEMOSTASIS) ×1 IMPLANT
LEGGING LITHOTOMY PAIR STRL (DRAPES) ×2 IMPLANT
NDL SAFETY ECLIPSE 18X1.5 (NEEDLE) ×1 IMPLANT
NDL SPNL 22GX3.5 QUINCKE BK (NEEDLE) ×1 IMPLANT
NEEDLE HYPO 18GX1.5 SHARP (NEEDLE) ×2
NEEDLE SPNL 22GX3.5 QUINCKE BK (NEEDLE) ×2 IMPLANT
NS IRRIG 500ML POUR BTL (IV SOLUTION) IMPLANT
PACK BASIN DAY SURGERY FS (CUSTOM PROCEDURE TRAY) ×2 IMPLANT
PAD OB MATERNITY 4.3X12.25 (PERSONAL CARE ITEMS) ×2 IMPLANT
PAD PREP 24X48 CUFFED NSTRL (MISCELLANEOUS) ×2 IMPLANT
PENCIL BUTTON HOLSTER BLD 10FT (ELECTRODE) ×1 IMPLANT
SCOPETTES 8  STERILE (MISCELLANEOUS) ×2
SCOPETTES 8 STERILE (MISCELLANEOUS) ×2 IMPLANT
SUT SILK 2 0 SH CR/8 (SUTURE) ×2 IMPLANT
SUT VIC AB 0 CT1 36 (SUTURE) ×4 IMPLANT
SYR CONTROL 10ML LL (SYRINGE) ×2 IMPLANT
SYR TB 1ML LL NO SAFETY (SYRINGE) ×2 IMPLANT
TOWEL OR 17X24 6PK STRL BLUE (TOWEL DISPOSABLE) ×4 IMPLANT
TRAY DSU PREP LF (CUSTOM PROCEDURE TRAY) ×2 IMPLANT
TUBE CONNECTING 12X1/4 (SUCTIONS) ×2 IMPLANT
WATER STERILE IRR 500ML POUR (IV SOLUTION) ×2 IMPLANT
YANKAUER SUCT BULB TIP NO VENT (SUCTIONS) ×2 IMPLANT

## 2013-03-12 SURGICAL SUPPLY — 39 items
APPLICATOR COTTON TIP 6IN STRL (MISCELLANEOUS) ×2 IMPLANT
BAG DECANTER FOR FLEXI CONT (MISCELLANEOUS) ×1 IMPLANT
BLADE SURG 11 STRL SS (BLADE) ×2 IMPLANT
CANISTER SUCTION 1200CC (MISCELLANEOUS) IMPLANT
CANISTER SUCTION 2500CC (MISCELLANEOUS) ×1 IMPLANT
CATH ROBINSON RED A/P 16FR (CATHETERS) ×2 IMPLANT
CLOTH BEACON ORANGE TIMEOUT ST (SAFETY) ×2 IMPLANT
COVER TABLE BACK 60X90 (DRAPES) ×2 IMPLANT
DRAPE LG THREE QUARTER DISP (DRAPES) ×2 IMPLANT
DRAPE UNDERBUTTOCKS STRL (DRAPE) ×2 IMPLANT
DRESSING TELFA 8X3 (GAUZE/BANDAGES/DRESSINGS) ×2 IMPLANT
ELECT BALL LEEP 3MM BLK (ELECTRODE) IMPLANT
ELECT BALL LEEP 5MM RED (ELECTRODE) ×2 IMPLANT
ELECT REM PT RETURN 9FT ADLT (ELECTROSURGICAL)
ELECTRODE REM PT RTRN 9FT ADLT (ELECTROSURGICAL) IMPLANT
GLOVE ECLIPSE 8.0 STRL XLNG CF (GLOVE) ×2 IMPLANT
GOWN PREVENTION PLUS LG XLONG (DISPOSABLE) ×2 IMPLANT
GOWN STRL REIN XL XLG (GOWN DISPOSABLE) ×2 IMPLANT
LEGGING LITHOTOMY PAIR STRL (DRAPES) ×2 IMPLANT
NDL SAFETY ECLIPSE 18X1.5 (NEEDLE) ×1 IMPLANT
NDL SPNL 22GX3.5 QUINCKE BK (NEEDLE) ×1 IMPLANT
NEEDLE HYPO 18GX1.5 SHARP (NEEDLE) ×2
NEEDLE SPNL 22GX3.5 QUINCKE BK (NEEDLE) ×2 IMPLANT
NS IRRIG 500ML POUR BTL (IV SOLUTION) IMPLANT
PACK BASIN DAY SURGERY FS (CUSTOM PROCEDURE TRAY) ×2 IMPLANT
PAD OB MATERNITY 4.3X12.25 (PERSONAL CARE ITEMS) ×2 IMPLANT
PAD PREP 24X48 CUFFED NSTRL (MISCELLANEOUS) ×2 IMPLANT
PENCIL BUTTON HOLSTER BLD 10FT (ELECTRODE) ×1 IMPLANT
SCOPETTES 8  STERILE (MISCELLANEOUS) ×1
SCOPETTES 8 STERILE (MISCELLANEOUS) ×2 IMPLANT
SUT SILK 2 0 SH CR/8 (SUTURE) ×2 IMPLANT
SUT VIC AB 0 CT1 36 (SUTURE) ×4 IMPLANT
SYR CONTROL 10ML LL (SYRINGE) ×2 IMPLANT
SYR TB 1ML LL NO SAFETY (SYRINGE) ×2 IMPLANT
TOWEL OR 17X24 6PK STRL BLUE (TOWEL DISPOSABLE) ×4 IMPLANT
TRAY DSU PREP LF (CUSTOM PROCEDURE TRAY) ×2 IMPLANT
TUBE CONNECTING 12X1/4 (SUCTIONS) ×2 IMPLANT
WATER STERILE IRR 500ML POUR (IV SOLUTION) ×3 IMPLANT
YANKAUER SUCT BULB TIP NO VENT (SUCTIONS) ×2 IMPLANT

## 2013-03-12 NOTE — Progress Notes (Signed)
Lenna Sciara, NP out to talk w pt's daughter.

## 2013-03-12 NOTE — Progress Notes (Signed)
Dr. Skeet Latch in .  Examined pt.  Packing removed and replaced by Dr. Skeet Latch. Pt tolerated procedure well.  # 16 Fr. Foley inserted by Dr. Skeet Latch w/o difficulty .  Clear yellow urine draining.  .  Pt states cramping now 6/10.

## 2013-03-12 NOTE — Anesthesia Procedure Notes (Signed)
Procedure Name: LMA Insertion Date/Time: 03/12/2013 8:03 AM Performed by: Denna Haggard D Pre-anesthesia Checklist: Patient identified, Emergency Drugs available, Suction available and Patient being monitored Patient Re-evaluated:Patient Re-evaluated prior to inductionOxygen Delivery Method: Circle System Utilized Preoxygenation: Pre-oxygenation with 100% oxygen Intubation Type: IV induction Ventilation: Mask ventilation without difficulty LMA: LMA inserted LMA Size: 4.0 Number of attempts: 1 Airway Equipment and Method: bite block Placement Confirmation: positive ETCO2 Tube secured with: Tape Dental Injury: Teeth and Oropharynx as per pre-operative assessment

## 2013-03-12 NOTE — Anesthesia Procedure Notes (Signed)
Procedure Name: Intubation Date/Time: 03/12/2013 11:34 AM Performed by: Mechele Claude Pre-anesthesia Checklist: Patient identified, Emergency Drugs available, Suction available and Patient being monitored Patient Re-evaluated:Patient Re-evaluated prior to inductionOxygen Delivery Method: Circle System Utilized Preoxygenation: Pre-oxygenation with 100% oxygen Intubation Type: IV induction, Rapid sequence and Cricoid Pressure applied Ventilation: Mask ventilation without difficulty Laryngoscope Size: Mac and 4 Tube type: Oral Tube size: 7.0 mm Number of attempts: 1 Airway Equipment and Method: stylet Placement Confirmation: ETT inserted through vocal cords under direct vision,  positive ETCO2 and breath sounds checked- equal and bilateral Secured at: 21 cm Tube secured with: Tape Dental Injury: Teeth and Oropharynx as per pre-operative assessment

## 2013-03-12 NOTE — H&P (View-Only) (Signed)
Consult Note: Gyn-Onc   Laurie Pratt 72 y.o. female  Chief Complaint  Patient presents with  . Cervical Carcinoma    New Consult    Assessment : Abnormal uterine bleeding and a recent Pap smear suspicious for squamous cell carcinoma.  Plan: We will await the biopsy report before making further treatment recommendations. If the biopsy is negative or inconclusive, bleeding the patient should be managed with a conization and examination under anesthesia.     HPI: 72 year old African American female seen in consultation at the request of Dr. Uvaldo Rising regarding management of a Pap smear recently obtained suspicious for squamous cell carcinoma.  The patient presented to Dr. Toney Rakes with abnormal uterine bleeding. The cervix was described as friable and a Pap smear was obtained with the above-noted results.  The patient claims she had a last Pap smear 2 years ago which was normal. She claims that the postmenopausal bleeding started approximately a month ago. She denies any pelvic pain or any GI or GU symptoms. She denies any past gynecologic history. Obstetrical history gravida 3.  Review of Systems:10 point review of systems is negative except as noted in interval history.   Vitals: Blood pressure 182/80, pulse 96, temperature 98.5 F (36.9 C), temperature source Oral, resp. rate 20, height 5' 6.77" (1.696 m), weight 177 lb 6.4 oz (80.468 kg).  Physical Exam: General : The patient is a healthy woman in no acute distress.  HEENT: normocephalic, extraoccular movements normal; neck is supple without thyromegally  Lynphnodes: Supraclavicular and inguinal nodes not enlarged  Abdomen: Soft, non-tender, no ascites, no organomegally, no masses, no hernias  Pelvic:  EGBUS: Normal female  Vagina: Atrophic but with no lesions.  Urethra and Bladder: Normal, non-tender  Cervix: Flush with the upper vagina. It is friable and I do not see any gross lesions and on palpation is not firm or  hard.  Uterus: Difficult to outline  Bi-manual examination: Non-tender; no adenxal masses or nodularity  Rectal: normal sphincter tone, no masses, no blood  Lower extremities: No edema or varicosities. Normal range of motion   Procedure note: After verbal informed consent was given, a directed biopsy of a friable area in the cervix or upper vagina was taken. Silver nitrate was used to control bleeding. The patient tolerated procedure well.     Allergies  Allergen Reactions  . Ace Inhibitors   . Doxazosin Mesylate   . Verapamil     REACTION: legs swelling, tired    Past Medical History  Diagnosis Date  . Hypertension   . Tics of organic origin     Dr. Doy Mince  . Hyperlipidemia     History reviewed. No pertinent past surgical history.  Current Outpatient Prescriptions  Medication Sig Dispense Refill  . atenolol-chlorthalidone (TENORETIC) 100-25 MG per tablet Take 1 tablet by mouth daily.  90 tablet  3  . cholecalciferol (VITAMIN D) 1000 UNITS tablet Take 1 tablet (1,000 Units total) by mouth daily.  100 tablet  3  . losartan (COZAAR) 100 MG tablet Take 1 tablet (100 mg total) by mouth daily.  90 tablet  3  . lovastatin (MEVACOR) 20 MG tablet Take 1 tablet (20 mg total) by mouth at bedtime.  90 tablet  3  . NONFORMULARY OR COMPOUNDED ITEM Estradiol .02% 1 ML Prefilled Applicator Sig: apply vaginally twice a week #90 Day Supply with 4 refills  1 each  4  . potassium chloride SA (K-DUR,KLOR-CON) 20 MEQ tablet Take 1 tablet (20 mEq total)  by mouth daily.  90 tablet  3  . trihexyphenidyl (ARTANE) 2 MG tablet Take 1.5 tablets (3 mg total) by mouth 3 (three) times daily with meals.  405 tablet  3  . amLODipine (NORVASC) 10 MG tablet Take 1 tablet (10 mg total) by mouth daily.  30 tablet  11   No current facility-administered medications for this visit.    History   Social History  . Marital Status: Widowed    Spouse Name: N/A    Number of Children: N/A  . Years of  Education: N/A   Occupational History  . Not on file.   Social History Main Topics  . Smoking status: Never Smoker   . Smokeless tobacco: Not on file  . Alcohol Use: No  . Drug Use: No  . Sexual Activity: Yes   Other Topics Concern  . Not on file   Social History Narrative  . No narrative on file    Family History  Problem Relation Age of Onset  . Hypertension Other   . Hypertension Mother   . Diabetes Father   . Hypertension Father   . Heart disease Maternal Aunt     CHF      CLARKE-PEARSON,Jeniya Flannigan L, MD 02/26/2013, 11:18 AM

## 2013-03-12 NOTE — Progress Notes (Signed)
Returned to OR. Via stretcher.  Transported by R. Calloway, CRNA and Prescott Parma, RN

## 2013-03-12 NOTE — Progress Notes (Signed)
Laurie Sciara, NP for Dr. Fermin Schwab informed of copious amt bloody drainage w/ large jelly- like clot.  Pt cleaned  And clean pad applied at 0930.  At [phone] pt only had moderate amt bloody drainage.   Pt c/o cramping 8/10 prior to large amt drainage noted.  Dr. Loletta Specter- Dianah Field in [place] at [place]. Informed of bloody drainage. Melissa called to come and see pt per Dr. Loletta Specter- Pearson's request. Pt informed .

## 2013-03-12 NOTE — Anesthesia Postprocedure Evaluation (Signed)
  Anesthesia Post-op Note  Patient: Laurie Pratt  Procedure(s) Performed: Procedure(s) (LRB): CONIZATION CERVIX WITH BIOPSY (N/A)  Patient Location: PACU  Anesthesia Type: General  Level of Consciousness: awake and alert   Airway and Oxygen Therapy: Patient Spontanous Breathing  Post-op Pain: mild  Post-op Assessment: Post-op Vital signs reviewed, Patient's Cardiovascular Status Stable, Respiratory Function Stable, Patent Airway and No signs of Nausea or vomiting  Last Vitals:  Filed Vitals:   03/12/13 1230  BP: 161/74  Pulse: 58  Temp:   Resp: 14    Post-op Vital Signs: stable   Complications: No apparent anesthesia complications

## 2013-03-12 NOTE — Anesthesia Preprocedure Evaluation (Addendum)
Anesthesia Evaluation  Patient identified by MRN, date of birth, ID band Patient awake    Reviewed: Allergy & Precautions, H&P , NPO status , Patient's Chart, lab work & pertinent test results  Airway Mallampati: II TM Distance: >3 FB Neck ROM: Full    Dental no notable dental hx.    Pulmonary neg pulmonary ROS,  breath sounds clear to auscultation  Pulmonary exam normal       Cardiovascular Exercise Tolerance: Good hypertension, Pt. on medications and Pt. on home beta blockers negative cardio ROS  Rhythm:Regular Rate:Normal     Neuro/Psych PSYCHIATRIC DISORDERS She takes artane for tremors, but she is not diagnosed with Parkinson's negative neurological ROS     GI/Hepatic negative GI ROS, Neg liver ROS,   Endo/Other  negative endocrine ROS  Renal/GU negative Renal ROS  negative genitourinary   Musculoskeletal negative musculoskeletal ROS (+)   Abdominal   Peds negative pediatric ROS (+)  Hematology negative hematology ROS (+)   Anesthesia Other Findings   Reproductive/Obstetrics negative OB ROS                          Anesthesia Physical Anesthesia Plan  ASA: II and emergent  Anesthesia Plan: General   Post-op Pain Management:    Induction: Intravenous  Airway Management Planned: Oral ETT  Additional Equipment:   Intra-op Plan:   Post-operative Plan: Extubation in OR  Informed Consent: I have reviewed the patients History and Physical, chart, labs and discussed the procedure including the risks, benefits and alternatives for the proposed anesthesia with the patient or authorized representative who has indicated his/her understanding and acceptance.   Dental advisory given  Plan Discussed with: CRNA  Anesthesia Plan Comments: (Ms. Oldenkamp may need to return to the OR for postoperative bleeding. Her vital signs remain stable and she is comfortable. She had two crackers and  some sprite in tha PACU. Plan GETA.)       Anesthesia Quick Evaluation

## 2013-03-12 NOTE — Anesthesia Postprocedure Evaluation (Signed)
  Anesthesia Post-op Note  Patient: Laurie Pratt  Procedure(s) Performed: Procedure(s) (LRB): COLD KNIFE CONIZATION CERVIX WITH BIOPSY/EXAM UNDER ANESTHESIA, RECTAL EXAM (N/A)  Patient Location: PACU  Anesthesia Type: General  Level of Consciousness: awake and alert   Airway and Oxygen Therapy: Patient Spontanous Breathing  Post-op Pain: mild  Post-op Assessment: Post-op Vital signs reviewed, Patient's Cardiovascular Status Stable, Respiratory Function Stable, Patent Airway and No signs of Nausea or vomiting  Last Vitals:  Filed Vitals:   03/12/13 0945  BP: 151/79  Pulse: 65  Temp:   Resp: 10    Post-op Vital Signs: stable   Complications: No apparent anesthesia complications

## 2013-03-12 NOTE — Op Note (Signed)
Laurie Pratt  female MEDICAL RECORD [id number] DATE OF BIRTH: 03-03-1941 PHYSICIAN: Marti Sleigh, M.D  03/12/2013   OPERATIVE REPORT  PREOPERATIVE DIAGNOSIS: Postoperative vaginal bleeding  POSTOPERATIVE DIAGNOSIS: Bleeding from the cervical cone bed  PROCEDURE: Hemostatic sutures placed in the cervix.  SURGEON: Marti Sleigh, M.D ASSISTANT: Lahoma Crocker, MD ANESTHESIA: Gen. oral tracheal tube ESTIMATED BLOOD LOSS: 50 mL  SURGICAL FINDINGS: Examination under anesthesia revealed bleeding from the cone bed on the left side.  PROCEDURE: Patient brought back to the operating room from PACU, and after satisfactory attainment of general anesthesia was placed in the modified lithotomy position in [place]. A timeout was taken. The perineum and vagina were prepped and the patient was draped. Deaver's were used to expose the cervix. Bleeding was identified the left lateral side. Several figure-of-eight suture placed in the cone bed to achieve hemostasis. Surgicel was placed in the cone bed as well and then oversewn to hold it in place with 2-0 chromic. The cervix was then observed for several minutes and no further bleeding was noted.  The patient was awakened from anesthesia and taken to the recovery room in satisfactory condition. Sponge needle and isthmic counts correct x2.   Marti Sleigh, M.D

## 2013-03-12 NOTE — Anesthesia Preprocedure Evaluation (Addendum)
Anesthesia Evaluation  Patient identified by MRN, date of birth, ID band Patient awake    Reviewed: Allergy & Precautions, H&P , NPO status , Patient's Chart, lab work & pertinent test results  Airway Mallampati: II TM Distance: >3 FB Neck ROM: Full    Dental no notable dental hx.    Pulmonary neg pulmonary ROS,  CXR: nad breath sounds clear to auscultation  Pulmonary exam normal       Cardiovascular Exercise Tolerance: Good hypertension, Pt. on medications and Pt. on home beta blockers negative cardio ROS  Rhythm:Regular Rate:Normal  ECG: SB 56, nonspecific TWA   Neuro/Psych PSYCHIATRIC DISORDERS negative neurological ROS     GI/Hepatic negative GI ROS, Neg liver ROS,   Endo/Other  negative endocrine ROS  Renal/GU negative Renal ROS  negative genitourinary   Musculoskeletal negative musculoskeletal ROS (+)   Abdominal   Peds negative pediatric ROS (+)  Hematology negative hematology ROS (+)   Anesthesia Other Findings   Reproductive/Obstetrics negative OB ROS                          Anesthesia Physical Anesthesia Plan  ASA: II  Anesthesia Plan: General   Post-op Pain Management:    Induction: Intravenous  Airway Management Planned: LMA  Additional Equipment:   Intra-op Plan:   Post-operative Plan: Extubation in OR  Informed Consent: I have reviewed the patients History and Physical, chart, labs and discussed the procedure including the risks, benefits and alternatives for the proposed anesthesia with the patient or authorized representative who has indicated his/her understanding and acceptance.   Dental advisory given  Plan Discussed with: CRNA  Anesthesia Plan Comments:        Anesthesia Quick Evaluation

## 2013-03-12 NOTE — Op Note (Signed)
Laurie Pratt  female MEDICAL RECORD [id number] DATE OF BIRTH: 29-Sep-1940 PHYSICIAN: Marti Sleigh, M.D  03/12/2013   OPERATIVE REPORT  PREOPERATIVE DIAGNOSIS: Pap smear suspicious for invasive cervical cancer  POSTOPERATIVE DIAGNOSIS: Same  PROCEDURE: Examination under anesthesia, cold knife conization.  SURGEON: Marti Sleigh, M.D ASSISTANT:  ANESTHESIA: LMA  ESTIMATED BLOOD LOSS: Minimal  SURGICAL FINDINGS: Examination under anesthesia revealed a stenotic upper vagina. The cervix was flush with the upper vagina and difficult to identify. On rectal exam was able to palpate the cervix. Ultimately, was able to get a tenaculum on the anterior portion cervix and pulled it cephalad. The uterus sounded to approximately 6 cm anteriorly.  PROCEDURE: Patient brought to the operating room and after satisfactory attainment of LMA anesthesia was placed in lithotomy position in [place]. The vagina and vulva were prepped. A surgical timeout was taken.  A narrow Deaver was placed in the posterior vagina and an anterior Deaver also placed attempting to expose the cervix. A rectal exam was performed identifying the cervix. With further manipulation of the retractors as able identify the anterior lip of the cervix which was grasped with a single-tooth tenaculum. The uterus was then sounded. Cold knife conization was then performed incising circumferentially around the cervix. (There were no obvious lesions noted except for vaginal atrophy). Silk sutures were placed on the cone specimen and the remainder the conization was completed. Hemostasis was then achieved using cautery.  Patient tolerated procedure well was taken to the recovery room in satisfactory condition sponge needle and isthmic counts correct x2   Marti Sleigh, M.D

## 2013-03-12 NOTE — Transfer of Care (Signed)
Immediate Anesthesia Transfer of Care Note  Patient: Laurie Pratt  Procedure(s) Performed: Procedure(s) (LRB): COLD KNIFE CONIZATION CERVIX WITH BIOPSY/EXAM UNDER ANESTHESIA, RECTAL EXAM (N/A)  Patient Location: PACU  Anesthesia Type: General  Level of Consciousness: awake, oriented, sedated and patient cooperative  Airway & Oxygen Therapy: Patient Spontanous Breathing and Patient connected to face mask oxygen  Post-op Assessment: Report given to PACU RN and Post -op Vital signs reviewed and stable  Post vital signs: Reviewed and stable  Complications: No apparent anesthesia complications

## 2013-03-12 NOTE — Progress Notes (Signed)
Spoke w Lenna Sciara NP regarding f/u appointment and pain med .  Melissa to call pt at home w f/u information.  vicodin to be called in to [facility] on [address].  Pt and daughter informed.

## 2013-03-12 NOTE — Progress Notes (Signed)
To the recovery unit at [facility] to assess the patient per Dr. Mora Bellman request.  Received a phone call from Nicola Police, RN with reports of a copious amount of bloody drainage w/ large jelly- like clot from the vaginal s/p cervical cone.  RN at the bedside.  Vitals stable and patient in no acute distress.  Large blood clot visualized at the introitus.  Vulva cleansed.  Upon speculum examination, moderate amount of bright red blood noted in the vagina.  Biopsy site on the left cervix actively bleeding.  Monsel's applied with pressure for five minutes.  Bleeding persisted through Monsel's application with pressure.  Vagina packed and saturated after five minutes.  8 F foley inserted without difficulty at 10:35am.  Dr. Skeet Latch at the bedside at 10:38am.  Saturated packing removed and dry packing replaced by Dr. Skeet Latch.  Dr. Fermin Schwab notified by RN staff.  Patient stable and vitals stable prior to leaving bedside.  Packing in place and foley draining clear, yellow urine.  RN with the patient at the bedside.  Spoke with the patient's daughter about the current situation and answered all questions.  Plans for Dr. Fermin Schwab to return when completed with his current surgical case for further evaluation and intervention.

## 2013-03-12 NOTE — Transfer of Care (Signed)
Immediate Anesthesia Transfer of Care Note  Patient: Laurie Pratt  Procedure(s) Performed: Procedure(s) (LRB): CONIZATION CERVIX WITH BIOPSY (N/A)  Patient Location: PACU  Anesthesia Type: General  Level of Consciousness: awake, alert  and oriented  Airway & Oxygen Therapy: Patient Spontanous Breathing and Patient connected to face mask oxygen  Post-op Assessment: Report given to PACU RN and Post -op Vital signs reviewed and stable  Post vital signs: Reviewed and stable  Complications: No apparent anesthesia complications

## 2013-03-12 NOTE — Interval H&P Note (Signed)
History and Physical Interval Note:  03/12/2013 11:19 AM  Laurie Pratt  has presented today for surgery, with the diagnosis of ABNORMAL PAP  The various methods of treatment have been discussed with the patient and family. After consideration of risks, benefits and other options for treatment, the patient has consented to  Procedure(s): COLD KNIFE CONIZATION CERVIX WITH BIOPSY/EXAM UNDER ANESTHESIA, RECTAL EXAM (N/A) as a surgical intervention .  The patient's history has been reviewed, patient examined, no change in status, stable for surgery.  I have reviewed the patient's chart and labs.  Questions were answered to the patient's satisfaction.    Following initial conization the patient had some continued vaginal bleeding. Despite packing twice at the bedside she continued to bleed through her packing and therefore we are returning to the operating room to perform an examination under anesthesia and control bleeding. The risks of the surgery were explained to the patient and her daughter. There were this could ultimately result in hysterectomy if necessary to control bleeding.   CLARKE-PEARSON,Mayu Ronk L

## 2013-03-12 NOTE — Interval H&P Note (Signed)
History and Physical Interval Note:  03/12/2013 7:34 AM  Laurie Pratt  has presented today for surgery, with the diagnosis of ABNORMAL PAP  The various methods of treatment have been discussed with the patient and family. After consideration of risks, benefits and other options for treatment, the patient has consented to  Procedure(s): COLD KNIFE CONIZATION CERVIX WITH BIOPSY/EXAM UNDER ANESTHESIA (N/A) as a surgical intervention .  The patient's history has been reviewed, patient examined, no change in status, stable for surgery.  I have reviewed the patient's chart and labs.  Questions were answered to the patient's satisfaction.     CLARKE-PEARSON,Shontez Sermon L

## 2013-03-12 NOTE — Progress Notes (Signed)
Dr. Loletta Specter - Dianah Field informed that [facility] here.  moncell used , pressure applied, still oozing.  Dr. Loletta Specter- Dianah Field informed via Marcie Bal in [place] .  Keep packing intact.

## 2013-03-12 NOTE — Progress Notes (Signed)
Prescription for hydrocodone/APAP 5/325 one to two tablets every six hours as needed for moderate to severe pain, #30, 0 refills called in to [facility] on [address] per Dr. Fermin Schwab.

## 2013-03-15 ENCOUNTER — Telehealth: Payer: Self-pay | Admitting: Gynecologic Oncology

## 2013-03-15 NOTE — Telephone Encounter (Signed)
Called to inform patient of results but available.  Daughter contacted and informed of final path results and Dr. Mora Bellman recommendations for 6 week post-op follow up appt.  Appt arranged.  No concerns voiced.

## 2013-03-19 ENCOUNTER — Encounter (HOSPITAL_BASED_OUTPATIENT_CLINIC_OR_DEPARTMENT_OTHER): Payer: Self-pay | Admitting: Gynecology

## 2013-04-01 ENCOUNTER — Ambulatory Visit: Payer: Medicare Other | Attending: Gynecologic Oncology | Admitting: Gynecologic Oncology

## 2013-04-01 ENCOUNTER — Encounter: Payer: Self-pay | Admitting: Gynecologic Oncology

## 2013-04-01 VITALS — BP 169/82 | HR 60 | Temp 97.5°F | Resp 16 | Ht 66.77 in | Wt 176.4 lb

## 2013-04-01 DIAGNOSIS — IMO0002 Reserved for concepts with insufficient information to code with codable children: Secondary | ICD-10-CM | POA: Insufficient documentation

## 2013-04-01 DIAGNOSIS — N939 Abnormal uterine and vaginal bleeding, unspecified: Secondary | ICD-10-CM

## 2013-04-01 NOTE — Patient Instructions (Signed)
Follow up as planned.  Please call for any questions or concerns.  Call if vaginal bleeding develops.

## 2013-04-01 NOTE — Progress Notes (Signed)
Follow Up Note: Gyn-Onc  Laurie Pratt 72 y.o. female  CC:  Chief Complaint  Patient presents with  . Follow-up    Post-op    HPI:  Laurie Pratt is a 72 year old African American female seen in consultation at the request of Dr. Uvaldo Rising regarding management of a pap smear recently obtained suspicious for squamous cell carcinoma.  The patient presented to Dr. Toney Rakes with abnormal uterine bleeding. The cervix was described as friable and a pap smear was obtained that resulted squamous cell carcinoma.  She reports having a pap smear 2 years ago, which was normal. She claims that the postmenopausal bleeding started approximately a month ago.  Obstetrical history gravida 3.  On 03/12/13, she underwent an examination under anesthesia and a cone knife conization by Dr. Fermin Schwab.  She developed post-operative vaginal bleeding and was noted to have bleeding from the cervical cone bed.  She then underwent placement of hemostatic sutures in the cervix later that morning.   Interval History:  She presents today with complaints of vaginal spotting.  Her daughter called this am stating that she saw bright red blood on some tissue in the trash can at her mother's house.  She stated that her mother had not reported vaginal bleeding since her procedure.  Today, the patient states that she has had spotting since surgery that has decreased each day.  No other concerns voiced.  See below for review of systems.  Review of Systems  Constitutional: Feels well.  No fever, chills, early satiety, unintentional weight loss or gain.  Cardiovascular: No chest pain, shortness of breath, or edema.  Pulmonary: No cough or wheeze.  Gastrointestinal: No nausea, vomiting, or diarrhea. No bright red blood per rectum or change in bowel movement.  Genitourinary: Positive for vaginal bleeding since conization.  No frequency, urgency, or dysuria. No discharge.  Musculoskeletal: No myalgia or joint  pain. Neurologic: No weakness, numbness, or change in gait.  Psychology: No depression, anxiety, or insomnia.  Current Meds:  Outpatient Encounter Prescriptions as of 04/01/2013  Medication Sig Dispense Refill  . atenolol-chlorthalidone (TENORETIC) 100-25 MG per tablet Take 1 tablet by mouth every morning.      . cholecalciferol (VITAMIN D) 1000 UNITS tablet Take 1,000 Units by mouth daily.      Marland Kitchen latanoprost (XALATAN) 0.005 % ophthalmic solution Place 1 drop into both eyes at bedtime.      Marland Kitchen losartan (COZAAR) 100 MG tablet Take 100 mg by mouth every morning.      . trihexyphenidyl (ARTANE) 2 MG tablet Take 3 mg by mouth 3 (three) times daily with meals. Takes 1 and 1/2       No facility-administered encounter medications on file as of 04/01/2013.    Allergy:  Allergies  Allergen Reactions  . Ace Inhibitors   . Doxazosin Mesylate   . Verapamil     REACTION: legs swelling, tired    Social Hx:   History   Social History  . Marital Status: Widowed    Spouse Name: N/A    Number of Children: N/A  . Years of Education: N/A   Occupational History  . Not on file.   Social History Main Topics  . Smoking status: Never Smoker   . Smokeless tobacco: Not on file  . Alcohol Use: No  . Drug Use: No  . Sexual Activity: Yes   Other Topics Concern  . Not on file   Social History Narrative  . No narrative on file  Past Surgical Hx:  Past Surgical History  Procedure Laterality Date  . Eye surgery      left eye  . Dilation and curettage of uterus    . Cervical conization w/bx N/A 03/12/2013    Procedure: COLD KNIFE CONIZATION CERVIX WITH BIOPSY/EXAM UNDER ANESTHESIA, RECTAL EXAM;  Surgeon: Alvino Chapel, MD;  Location: [place];  Service: Gynecology;  Laterality: N/A;  . Cervical conization w/bx N/A 03/12/2013    Procedure: CONIZATION CERVIX WITH BIOPSY;  Surgeon: Alvino Chapel, MD;  Location: [facility];  Service: Gynecology;   Laterality: N/A;  patient returned to OR for fulgeration to stop bleeding    Past Medical Hx:  Past Medical History  Diagnosis Date  . Hypertension   . Tics of organic origin     Dr. Doy Mince  . Hyperlipidemia     Family Hx:  Family History  Problem Relation Age of Onset  . Hypertension Other   . Hypertension Mother   . Diabetes Father   . Hypertension Father   . Heart disease Maternal Aunt     CHF    Vitals:  Blood pressure 169/82, pulse 60, temperature 97.5 F (36.4 C), resp. rate 16, height 5' 6.77" (1.696 m), weight 176 lb 6.4 oz (80.015 kg), SpO2 100.00%.  Physical Exam:  General: Well developed, well nourished female in no acute distress. Alert and oriented x 3.  Cardiovascular: Regular rate and rhythm. S1 and S2 normal.  Lungs: Clear to auscultation bilaterally. No wheezes/crackles/rhonchi noted.  Skin: No rashes or lesions present. Back: No CVA tenderness.  Abdomen: Abdomen soft, non-tender and obese. Active bowel sounds in all quadrants. No evidence of a fluid wave or abdominal masses.  Genitourinary:    Vulva/vagina: Normal external female genitalia. No lesions.    Urethra: No lesions or masses.    Vagina: Minimal amount of bright red blood noted to the left lateral aspect of the cervix.  Silver nitrate applied to the area with no further bleeding noted.  Extremities: No bilateral cyanosis, edema, or clubbing.   Assessment/Plan:  72 year old s/p exam under anesthesia and cold knife conization by Dr. Fermin Schwab on 03/13/11. She developed post-operative vaginal bleeding and was noted to have bleeding from the cervical cone bed.  She then underwent placement of hemostatic sutures in the cervix later that morning on 03/12/13.  She is doing well post-operatively except for complaints of bright red vaginal bleeding since surgery.  No bleeding noted after examination and silver nitrate application today.  Reportable signs and symptoms reviewed.  Advised to call if  vaginal bleeding resumes.  Verbalizing understanding.  Daughter at the bedside.  Follow up appointment made with Dr. Fermin Schwab for 05/03/13 or sooner if needed.     Laurie Demuro DEAL, NP 04/01/2013, 3:01 PM

## 2013-04-30 ENCOUNTER — Ambulatory Visit (INDEPENDENT_AMBULATORY_CARE_PROVIDER_SITE_OTHER): Payer: Medicare Other | Admitting: Internal Medicine

## 2013-04-30 ENCOUNTER — Other Ambulatory Visit (INDEPENDENT_AMBULATORY_CARE_PROVIDER_SITE_OTHER): Payer: Medicare Other

## 2013-04-30 ENCOUNTER — Encounter: Payer: Self-pay | Admitting: Internal Medicine

## 2013-04-30 VITALS — BP 130/82 | HR 76 | Temp 98.4°F | Resp 16 | Ht 66.5 in | Wt 177.0 lb

## 2013-04-30 DIAGNOSIS — I10 Essential (primary) hypertension: Secondary | ICD-10-CM | POA: Diagnosis not present

## 2013-04-30 DIAGNOSIS — Z0000 Encounter for general adult medical examination without abnormal findings: Secondary | ICD-10-CM

## 2013-04-30 DIAGNOSIS — E785 Hyperlipidemia, unspecified: Secondary | ICD-10-CM

## 2013-04-30 LAB — CBC WITH DIFFERENTIAL/PLATELET
Basophils Absolute: 0 10*3/uL (ref 0.0–0.1)
Eosinophils Absolute: 0.1 10*3/uL (ref 0.0–0.7)
Hemoglobin: 12.9 g/dL (ref 12.0–15.0)
Lymphocytes Relative: 40.4 % (ref 12.0–46.0)
MCHC: 33.6 g/dL (ref 30.0–36.0)
MCV: 85.8 fl (ref 78.0–100.0)
Monocytes Absolute: 0.6 10*3/uL (ref 0.1–1.0)
Neutro Abs: 2.2 10*3/uL (ref 1.4–7.7)
Neutrophils Relative %: 44 % (ref 43.0–77.0)
RDW: 13.8 % (ref 11.5–14.6)

## 2013-04-30 LAB — LDL CHOLESTEROL, DIRECT: Direct LDL: 155.6 mg/dL

## 2013-04-30 LAB — URINALYSIS
Leukocytes, UA: NEGATIVE
Nitrite: NEGATIVE
Specific Gravity, Urine: 1.02 (ref 1.000–1.030)
Total Protein, Urine: NEGATIVE
Urine Glucose: NEGATIVE
pH: 6 (ref 5.0–8.0)

## 2013-04-30 LAB — HEPATIC FUNCTION PANEL
AST: 23 U/L (ref 0–37)
Alkaline Phosphatase: 70 U/L (ref 39–117)
Total Bilirubin: 0.6 mg/dL (ref 0.3–1.2)

## 2013-04-30 LAB — BASIC METABOLIC PANEL
BUN: 21 mg/dL (ref 6–23)
CO2: 30 mEq/L (ref 19–32)
Calcium: 9.8 mg/dL (ref 8.4–10.5)
Chloride: 100 mEq/L (ref 96–112)
Creatinine, Ser: 1.1 mg/dL (ref 0.4–1.2)
GFR: 66.67 mL/min (ref >60.00)
Glucose, Bld: 95 mg/dL (ref 70–99)
Potassium: 4 mEq/L (ref 3.5–5.1)
Sodium: 137 mEq/L (ref 135–145)

## 2013-04-30 LAB — LIPID PANEL: Total CHOL/HDL Ratio: 4

## 2013-04-30 LAB — TSH: TSH: 3.58 u[IU]/mL (ref 0.35–5.50)

## 2013-04-30 MED ORDER — ATENOLOL-CHLORTHALIDONE 100-25 MG PO TABS: 1.0000 | ORAL_TABLET | Freq: Every morning | ORAL | Status: DC

## 2013-04-30 NOTE — Progress Notes (Signed)
  Subjective:    HPI  The patient is here for a wellness exam. The patient has been doing well overall without major physical or psychological issues going on lately. The patient presents for a follow-up of  chronic hypertension, chronic dyslipidemia, tic    Review of Systems  Constitutional: Negative for chills, activity change, appetite change, fatigue and unexpected weight change.  HENT: Negative for congestion, mouth sores and sinus pressure.   Eyes: Negative for visual disturbance.  Respiratory: Negative for cough and chest tightness.   Gastrointestinal: Negative for nausea and abdominal pain.  Genitourinary: Negative for frequency, difficulty urinating and vaginal pain.  Musculoskeletal: Negative for back pain and gait problem.  Skin: Negative for pallor and rash.  Neurological: Negative for dizziness, tremors, weakness, numbness and headaches.  Psychiatric/Behavioral: Negative for confusion and sleep disturbance.   BP Readings from Last 3 Encounters:  04/30/13 130/82  04/01/13 169/82  03/12/13 190/81   Wt Readings from Last 3 Encounters:  04/30/13 177 lb (80.287 kg)  04/01/13 176 lb 6.4 oz (80.015 kg)  03/12/13 176 lb (79.833 kg)        Objective:   Physical Exam  Constitutional: She appears well-developed and well-nourished. No distress.  HENT:  Head: Normocephalic.  Right Ear: External ear normal.  Left Ear: External ear normal.  Nose: Nose normal.  Mouth/Throat: Oropharynx is clear and moist.  Eyes: Conjunctivae are normal. Pupils are equal, round, and reactive to light. Right eye exhibits no discharge. Left eye exhibits no discharge.  Neck: Normal range of motion. Neck supple. No JVD present. No tracheal deviation present. No thyromegaly present.  Cardiovascular: Normal rate, regular rhythm and normal heart sounds.   Pulmonary/Chest: No stridor. No respiratory distress. She has no wheezes.  Abdominal: Soft. Bowel sounds are normal. She exhibits no distension  and no mass. There is no tenderness. There is no rebound and no guarding.  Musculoskeletal: She exhibits no edema (trace B) and no tenderness.  Lymphadenopathy:    She has no cervical adenopathy.  Neurological: She displays normal reflexes. No cranial nerve deficit. She exhibits normal muscle tone. Coordination normal.  Face tic  Skin: No rash noted. No erythema.  Psychiatric: She has a normal mood and affect. Her behavior is normal. Judgment and thought content normal.     Lab Results  Component Value Date   WBC 5.0 03/07/2013   HGB 13.1 03/12/2013   HCT 39.5 03/07/2013   PLT 305 03/07/2013   GLUCOSE 97 03/07/2013   CHOL 226* 08/25/2011   TRIG 79.0 08/25/2011   HDL 68.30 08/25/2011   LDLDIRECT 140.5 08/25/2011   ALT 24 08/25/2011   AST 24 08/25/2011   NA 137 03/07/2013   K 3.6 03/07/2013   CL 98 03/07/2013   CREATININE 1.03 03/07/2013   BUN 13 03/07/2013   CO2 31 03/07/2013   TSH 3.87 08/25/2011        Assessment & Plan:

## 2013-04-30 NOTE — Assessment & Plan Note (Addendum)
Here for medicare wellness/physical  Diet: heart healthy  Physical activity: not sedentary  Depression/mood screen: negative  Hearing: intact to whispered voice  Visual acuity: grossly normal, performs annual eye exam  ADLs: capable  Fall risk: none  Home safety: good  Cognitive evaluation: intact to orientation, naming, recall and repetition  EOL planning: adv directives, full code/ I agree  I have personally reviewed and have noted  1. The patient's medical and social history  2. Their use of alcohol, tobacco or illicit drugs  3. Their current medications and supplements  4. The patient's functional ability including ADL's, fall risks, home safety risks and hearing or visual impairment.  5. Diet and physical activities  6. Evidence for depression or mood disorders    Today patient counseled on age appropriate routine health concerns for screening and prevention, each reviewed and up to date or declined. Immunizations reviewed and up to date or declined. Labs ordered and reviewed. Risk factors for depression reviewed and negative. Hearing function and visual acuity are intact. ADLs screened and addressed as needed. Functional ability and level of safety reviewed and appropriate. Education, counseling and referrals performed based on assessed risks today. Patient provided with a copy of personalized plan for preventive services.   She is a [employer]

## 2013-04-30 NOTE — Assessment & Plan Note (Signed)
Continue with current prescription therapy as reflected on the Med list.  

## 2013-05-03 ENCOUNTER — Encounter: Payer: Self-pay | Admitting: Gynecology

## 2013-05-03 ENCOUNTER — Ambulatory Visit: Payer: Medicare Other | Attending: Gynecology | Admitting: Gynecology

## 2013-05-03 VITALS — BP 150/98 | HR 60 | Temp 98.6°F | Resp 16 | Ht 66.77 in | Wt 174.0 lb

## 2013-05-03 DIAGNOSIS — D069 Carcinoma in situ of cervix, unspecified: Secondary | ICD-10-CM | POA: Diagnosis not present

## 2013-05-03 DIAGNOSIS — C539 Malignant neoplasm of cervix uteri, unspecified: Secondary | ICD-10-CM

## 2013-05-03 DIAGNOSIS — Z79899 Other long term (current) drug therapy: Secondary | ICD-10-CM | POA: Insufficient documentation

## 2013-05-03 DIAGNOSIS — G2569 Other tics of organic origin: Secondary | ICD-10-CM | POA: Insufficient documentation

## 2013-05-03 DIAGNOSIS — E785 Hyperlipidemia, unspecified: Secondary | ICD-10-CM | POA: Diagnosis not present

## 2013-05-03 DIAGNOSIS — I10 Essential (primary) hypertension: Secondary | ICD-10-CM | POA: Diagnosis not present

## 2013-05-03 NOTE — Patient Instructions (Signed)
Return to see me on 05/29/2013. We would like to schedule a hysterectomy at that time.

## 2013-05-03 NOTE — Progress Notes (Signed)
Consult Note: Gyn-Onc   Laurie Pratt 72 y.o. female  Chief Complaint  Patient presents with  . CIN    Follow up     Assessment and plan: Severe dysplasia of the cervix. I recommend the patient undergo abdominal hysterectomy and bilateral salpingo-oophorectomy. The patient is uncertain as to the decision that she can make and would like to defer final answer until discussing with her family. She is scheduled to return to see me for an office visit on 05/29/2013. I may clear the patient that treatment of the severe dysplasia is important so that she does not progress to an invasive carcinoma.  Interval history: The patient returns today to discuss her pathology. She has had no difficulties since her last visit. She denies any bleeding pelvic pain or pressure or any GI or GU symptoms.   HPI:  Patient initially presented with abnormal uterine bleeding, friable cervix, and a Pap smear suspicious for squamous cell carcinoma. She underwent a cold knife conization on 03/12/2013. Final pathology showed high-grade squamous intraepithelial lesion with endocervical glandular extension and a positive ectocervical margin. The patient had some postoperative bleeding which resolved. Review of Systems:10 point review of systems is negative except as noted in interval history.   Vitals: Blood pressure 150/98, pulse 60, temperature 98.6 F (37 C), temperature source Oral, resp. rate 16, height 5' 6.77" (1.696 m), weight 174 lb (78.926 kg).  Physical Exam: General : The patient is a healthy woman in no acute distress.  HEENT: normocephalic, extraoccular movements normal; neck is supple without thyromegally  Lynphnodes: Supraclavicular and inguinal nodes not enlarged  Abdomen: Soft, non-tender, no ascites, no organomegally, no masses, no hernias  Pelvic:  EGBUS: Normal female  Vagina: Atrophic but with no lesions.  Urethra and Bladder: Normal, non-tender  Cervix: Flush with the upper vagina. This area  is healing well. It is no longer friable. Uterus: Difficult to outline  Bi-manual examination: Non-tender; no adenxal masses or nodularity  Rectal: normal sphincter tone, no masses, no blood  Lower extremities: No edema or varicosities. Normal range of motion   Procedure note: After verbal informed consent was given, a directed biopsy of a friable area in the cervix or upper vagina was taken. Silver nitrate was used to control bleeding. The patient tolerated procedure well.     Allergies  Allergen Reactions  . Ace Inhibitors   . Doxazosin Mesylate   . Verapamil     REACTION: legs swelling, tired    Past Medical History  Diagnosis Date  . Hypertension   . Tics of organic origin     Dr. Doy Mince  . Hyperlipidemia     Past Surgical History  Procedure Laterality Date  . Eye surgery      left eye  . Dilation and curettage of uterus    . Cervical conization w/bx N/A 03/12/2013    Procedure: COLD KNIFE CONIZATION CERVIX WITH BIOPSY/EXAM UNDER ANESTHESIA, RECTAL EXAM;  Surgeon: Alvino Chapel, MD;  Location: [place];  Service: Gynecology;  Laterality: N/A;  . Cervical conization w/bx N/A 03/12/2013    Procedure: CONIZATION CERVIX WITH BIOPSY;  Surgeon: Alvino Chapel, MD;  Location: [facility];  Service: Gynecology;  Laterality: N/A;  patient returned to OR for fulgeration to stop bleeding    Current Outpatient Prescriptions  Medication Sig Dispense Refill  . atenolol-chlorthalidone (TENORETIC) 100-25 MG per tablet Take 1 tablet by mouth every morning.  90 tablet  3  . cholecalciferol (VITAMIN D)  1000 UNITS tablet Take 1,000 Units by mouth daily.      Marland Kitchen latanoprost (XALATAN) 0.005 % ophthalmic solution Place 1 drop into both eyes at bedtime.      Marland Kitchen losartan (COZAAR) 100 MG tablet Take 100 mg by mouth every morning.      . trihexyphenidyl (ARTANE) 2 MG tablet Take 3 mg by mouth 3 (three) times daily with meals. Takes 1 and 1/2        No current facility-administered medications for this visit.    History   Social History  . Marital Status: Widowed    Spouse Name: N/A    Number of Children: N/A  . Years of Education: N/A   Occupational History  . Not on file.   Social History Main Topics  . Smoking status: Never Smoker   . Smokeless tobacco: Not on file  . Alcohol Use: No  . Drug Use: No  . Sexual Activity: Yes   Other Topics Concern  . Not on file   Social History Narrative  . No narrative on file    Family History  Problem Relation Age of Onset  . Hypertension Other   . Hypertension Mother   . Diabetes Father   . Hypertension Father   . Heart disease Maternal Aunt     CHF      CLARKE-PEARSON,Wilman Tucker L, MD 05/03/2013, 9:25 AM

## 2013-05-29 ENCOUNTER — Ambulatory Visit: Payer: Medicare Other | Admitting: Gynecology

## 2013-09-04 ENCOUNTER — Other Ambulatory Visit: Payer: Self-pay | Admitting: Neurology

## 2013-10-16 ENCOUNTER — Other Ambulatory Visit: Payer: Self-pay | Admitting: Neurology

## 2013-10-16 NOTE — Telephone Encounter (Signed)
Patient has appt in July

## 2013-10-30 ENCOUNTER — Ambulatory Visit (INDEPENDENT_AMBULATORY_CARE_PROVIDER_SITE_OTHER): Payer: Medicare Other | Admitting: Internal Medicine

## 2013-10-30 ENCOUNTER — Telehealth: Payer: Self-pay | Admitting: Internal Medicine

## 2013-10-30 ENCOUNTER — Encounter: Payer: Self-pay | Admitting: Internal Medicine

## 2013-10-30 VITALS — BP 144/82 | HR 68 | Temp 97.2°F | Wt 178.5 lb

## 2013-10-30 DIAGNOSIS — E785 Hyperlipidemia, unspecified: Secondary | ICD-10-CM

## 2013-10-30 DIAGNOSIS — I10 Essential (primary) hypertension: Secondary | ICD-10-CM | POA: Diagnosis not present

## 2013-10-30 DIAGNOSIS — F959 Tic disorder, unspecified: Secondary | ICD-10-CM

## 2013-10-30 MED ORDER — LOSARTAN POTASSIUM 100 MG PO TABS: 100.0000 mg | ORAL_TABLET | Freq: Every day | ORAL | Status: DC

## 2013-10-30 MED ORDER — VITAMIN D 1000 UNITS PO TABS: 1000.0000 [IU] | ORAL_TABLET | Freq: Every day | ORAL | Status: AC

## 2013-10-30 NOTE — Progress Notes (Signed)
Pre visit review using our clinic review tool, if applicable. No additional management support is needed unless otherwise documented below in the visit note. 

## 2013-10-30 NOTE — Assessment & Plan Note (Signed)
  On diet  

## 2013-10-30 NOTE — Progress Notes (Signed)
  Subjective:    HPI   The patient presents for a follow-up of  chronic hypertension, chronic dyslipidemia, tic    Review of Systems  Constitutional: Negative for chills, activity change, appetite change, fatigue and unexpected weight change.  HENT: Negative for congestion, mouth sores and sinus pressure.   Eyes: Negative for visual disturbance.  Respiratory: Negative for cough and chest tightness.   Gastrointestinal: Negative for nausea and abdominal pain.  Genitourinary: Negative for frequency, difficulty urinating and vaginal pain.  Musculoskeletal: Negative for back pain and gait problem.  Skin: Negative for pallor and rash.  Neurological: Negative for dizziness, tremors, weakness, numbness and headaches.  Psychiatric/Behavioral: Negative for confusion and sleep disturbance.   BP Readings from Last 3 Encounters:  10/30/13 144/82  05/03/13 150/98  04/30/13 130/82   Wt Readings from Last 3 Encounters:  10/30/13 178 lb 8 oz (80.967 kg)  05/03/13 174 lb (78.926 kg)  04/30/13 177 lb (80.287 kg)        Objective:   Physical Exam  Constitutional: She appears well-developed and well-nourished. No distress.  HENT:  Head: Normocephalic.  Right Ear: External ear normal.  Left Ear: External ear normal.  Nose: Nose normal.  Mouth/Throat: Oropharynx is clear and moist.  Eyes: Conjunctivae are normal. Pupils are equal, round, and reactive to light. Right eye exhibits no discharge. Left eye exhibits no discharge.  Neck: Normal range of motion. Neck supple. No JVD present. No tracheal deviation present. No thyromegaly present.  Cardiovascular: Normal rate, regular rhythm and normal heart sounds.   Pulmonary/Chest: No stridor. No respiratory distress. She has no wheezes.  Abdominal: Soft. Bowel sounds are normal. She exhibits no distension and no mass. There is no tenderness. There is no rebound and no guarding.  Musculoskeletal: She exhibits no edema (trace B) and no tenderness.   Lymphadenopathy:    She has no cervical adenopathy.  Neurological: She displays normal reflexes. No cranial nerve deficit. She exhibits normal muscle tone. Coordination normal.  Face tic  Skin: No rash noted. No erythema.  Psychiatric: She has a normal mood and affect. Her behavior is normal. Judgment and thought content normal.     Lab Results  Component Value Date   WBC 4.9 04/30/2013   HGB 12.9 04/30/2013   HCT 38.5 04/30/2013   PLT 327.0 04/30/2013   GLUCOSE 95 04/30/2013   CHOL 243* 04/30/2013   TRIG 91.0 04/30/2013   HDL 62.40 04/30/2013   LDLDIRECT 155.6 04/30/2013   ALT 15 04/30/2013   AST 23 04/30/2013   NA 137 04/30/2013   K 4.0 04/30/2013   CL 100 04/30/2013   CREATININE 1.1 04/30/2013   BUN 21 04/30/2013   CO2 30 04/30/2013   TSH 3.58 04/30/2013        Assessment & Plan:

## 2013-10-30 NOTE — Assessment & Plan Note (Signed)
Continue with current prescription therapy as reflected on the Med list.  

## 2013-10-30 NOTE — Telephone Encounter (Signed)
Relevant patient education mailed to patient.  

## 2014-01-09 ENCOUNTER — Other Ambulatory Visit: Payer: Self-pay | Admitting: Internal Medicine

## 2014-01-30 ENCOUNTER — Ambulatory Visit (INDEPENDENT_AMBULATORY_CARE_PROVIDER_SITE_OTHER): Payer: Medicare Other | Admitting: Nurse Practitioner

## 2014-01-30 ENCOUNTER — Encounter: Payer: Self-pay | Admitting: Nurse Practitioner

## 2014-01-30 VITALS — BP 162/88 | HR 61 | Ht 67.75 in | Wt 179.0 lb

## 2014-01-30 DIAGNOSIS — F959 Tic disorder, unspecified: Secondary | ICD-10-CM

## 2014-01-30 DIAGNOSIS — R259 Unspecified abnormal involuntary movements: Secondary | ICD-10-CM | POA: Diagnosis not present

## 2014-01-30 MED ORDER — TRIHEXYPHENIDYL HCL 2 MG PO TABS: 3.0000 mg | ORAL_TABLET | Freq: Three times a day (TID) | ORAL | Status: DC

## 2014-01-30 NOTE — Patient Instructions (Signed)
Artane to continue at 6am, 2pm and 9pm Will refill F/U in 1 year

## 2014-01-30 NOTE — Progress Notes (Signed)
[facility]  PATIENT: Laurie Pratt DOB: 05-Jan-1941   REASON FOR VISIT: follow up for blepharospasm   HISTORY OF PRESENT ILLNESS: Ms. Laurie Pratt, 73 year old female returns for followup. She was last seen by Dr. Krista Blue on 07/31/2012. She has a history of Meige's syndrome presenting primarily with blepharospasm and facial dyskinesias. She has been followed here since 2001 by Dr. Doy Mince. She had probable cervical dystonia in the past but that has not been a recent problem. She underwent Botox injections in July 2004 and May 2007 for blepharospasm without significant improvement.  She also takes Artane 2mg  one and 1/2 tab tid, which is beneficial. Her symptoms have been stable since last seen. She denies any side effects to her medication. On exam today she has in frequent pursing and smiling movements of the lips, good voluntary movement in the face tongue and palate.  She denies gait difficulties she returns for reevaluation REVIEW OF SYSTEMS: Full 14 system review of systems performed and notable only for those listed, all others are neg:  Constitutional: N/A  Cardiovascular: N/A  Ear/Nose/Throat: N/A  Skin: N/A  Eyes: N/A  Respiratory: N/A  Gastroitestinal: N/A  Hematology/Lymphatic: N/A  Endocrine: N/A Musculoskeletal:N/A  Allergy/Immunology: N/A  Neurological: Visual tremor  Psychiatric: N/A Sleep : NA   ALLERGIES: Allergies  Allergen Reactions  . Ace Inhibitors   . Doxazosin Mesylate   . Verapamil     REACTION: legs swelling, tired    HOME MEDICATIONS: Outpatient Prescriptions Prior to Visit  Medication Sig Dispense Refill  . cholecalciferol (VITAMIN D) 1000 UNITS tablet Take 1 tablet (1,000 Units total) by mouth daily.  100 tablet  3  . losartan (COZAAR) 100 MG tablet take 1 tablet by mouth once daily  90 tablet  3  . trihexyphenidyl (ARTANE) 2 MG tablet TAKE 1 AND 1/2 TABLETS 3 TIMES DAILY  150 tablet  3  . losartan (COZAAR) 100 MG tablet Take  1 tablet (100 mg total) by mouth daily.  30 tablet  11   No facility-administered medications prior to visit.    PAST MEDICAL HISTORY: Past Medical History  Diagnosis Date  . Hypertension   . Tics of organic origin     Dr. Doy Mince  . Hyperlipidemia     PAST SURGICAL HISTORY: Past Surgical History  Procedure Laterality Date  . Eye surgery      left eye  . Dilation and curettage of uterus    . Cervical conization w/bx N/A 03/12/2013    Procedure: COLD KNIFE CONIZATION CERVIX WITH BIOPSY/EXAM UNDER ANESTHESIA, RECTAL EXAM;  Surgeon: Alvino Chapel, MD;  Location: [facility];  Service: Gynecology;  Laterality: N/A;  . Cervical conization w/bx N/A 03/12/2013    Procedure: CONIZATION CERVIX WITH BIOPSY;  Surgeon: Alvino Chapel, MD;  Location: [facility];  Service: Gynecology;  Laterality: N/A;  patient returned to OR for fulgeration to stop bleeding    FAMILY HISTORY: Family History  Problem Relation Age of Onset  . Hypertension Other   . Hypertension Mother   . Diabetes Father   . Hypertension Father   . Heart disease Maternal Aunt     CHF    SOCIAL HISTORY: History   Social History  . Marital Status: Widowed    Spouse Name: N/A    Number of Children: 2  . Years of Education: 12   Occupational History  . retired     Social History Main Topics  . Smoking status: Never Smoker   .  Smokeless tobacco: Never Used  . Alcohol Use: No  . Drug Use: No  . Sexual Activity: Yes   Other Topics Concern  . Not on file   Social History Narrative   Patient is widowed with 2 children   Patient is right handed   Patient has a high school education   Patient drinks 1 cup daily     PHYSICAL EXAM  Filed Vitals:   01/30/14 0848  BP: 162/88  Pulse: 61  Height: 5' 7.75" (1.721 m)  Weight: 179 lb (81.194 kg)   Body mass index is 27.41 kg/(m^2). General: well developed, well  nourished, seated, in no evident distress Head:  head  normocephalic and atraumatic.  Oropharynx benign. Neck: supple with no carotid  bruits. Cardiovascular: regular rate and rhythm, no murmurs  Neurologic Exam  Mental Status: Awake and fully alert.  Oriented to place and time. Follows all commands.  Mood and affect appropriate. Cranial Nerves:  Pupils equal, briskly reactive to light.  Extraocular movements full without nystagmus.  Visual fields full to confrontation.  Hearing intact and symmetric to finger snap.  Facial sensation intact.  Face, tongue, palate move normally and symmetrically.  Neck flexion and extension normal. Infrequent pursing and smiling movements of the lips. Rare blepharospasm noted.  Motor: Normal bulk and tone.  Normal strength in all tested extremity muscles. Sensory: intact to light touch. Coordination: Rapid alternating movements normal in all extremities.  Finger-to-nose and heel-to-shin performed accurately bilaterally. Gait and Station: Arises from chair without difficulty.  Stance is normal.   Reflexes: 1+ and symmetric.  Toes downgoing.    DIAGNOSTIC DATA (LABS, IMAGING, TESTING) - I reviewed patient records, labs, notes, testing and imaging myself where available.  Lab Results  Component Value Date   WBC 4.9 04/30/2013   HGB 12.9 04/30/2013   HCT 38.5 04/30/2013   MCV 85.8 04/30/2013   PLT 327.0 04/30/2013      Component Value Date/Time   NA 137 04/30/2013 0843   K 4.0 04/30/2013 0843   CL 100 04/30/2013 0843   CO2 30 04/30/2013 0843   GLUCOSE 95 04/30/2013 0843   BUN 21 04/30/2013 0843   CREATININE 1.1 04/30/2013 0843   CALCIUM 9.8 04/30/2013 0843   PROT 7.8 04/30/2013 0843   ALBUMIN 3.8 04/30/2013 0843   AST 23 04/30/2013 0843   ALT 15 04/30/2013 0843   ALKPHOS 70 04/30/2013 0843   BILITOT 0.6 04/30/2013 0843   GFRNONAA 54* 03/07/2013 1125   GFRAA 63* 03/07/2013 1125   Lab Results  Component Value Date   CHOL 243* 04/30/2013   HDL 62.40 04/30/2013   LDLDIRECT 155.6 04/30/2013    TRIG 91.0 04/30/2013   CHOLHDL 4 04/30/2013    Lab Results  Component Value Date   TSH 3.58 04/30/2013      ASSESSMENT AND PLAN  73 y.o. year old female  has a past medical history of Hypertension; Tics of organic origin; and Hyperlipidemia. here to followup. She is a patient of Dr. Krista Blue who is  out of the office.   Artane to continue at 6am, 2pm and 9pm Will refill F/U in 1 year  Dennie Bible, [facility], [facility], [facility] [address] [place], [place] [address] [phone]

## 2014-01-31 NOTE — Progress Notes (Signed)
I agree with the above plan 

## 2014-02-28 ENCOUNTER — Encounter: Payer: Self-pay | Admitting: Internal Medicine

## 2014-02-28 ENCOUNTER — Ambulatory Visit (INDEPENDENT_AMBULATORY_CARE_PROVIDER_SITE_OTHER): Payer: Medicare Other | Admitting: Internal Medicine

## 2014-02-28 VITALS — BP 150/90 | HR 72 | Temp 98.2°F | Resp 16 | Wt 179.0 lb

## 2014-02-28 DIAGNOSIS — R300 Dysuria: Secondary | ICD-10-CM | POA: Diagnosis not present

## 2014-02-28 DIAGNOSIS — N390 Urinary tract infection, site not specified: Secondary | ICD-10-CM | POA: Diagnosis not present

## 2014-02-28 DIAGNOSIS — R319 Hematuria, unspecified: Secondary | ICD-10-CM

## 2014-02-28 DIAGNOSIS — I10 Essential (primary) hypertension: Secondary | ICD-10-CM | POA: Diagnosis not present

## 2014-02-28 LAB — POCT URINALYSIS DIPSTICK
BILIRUBIN UA: NEGATIVE
Blood, UA: NEGATIVE
Glucose, UA: NEGATIVE
KETONES UA: NEGATIVE
Nitrite, UA: NEGATIVE
PH UA: 7
Protein, UA: NEGATIVE
Spec Grav, UA: 1.01
Urobilinogen, UA: NEGATIVE

## 2014-02-28 MED ORDER — CIPROFLOXACIN HCL 500 MG PO TABS: 500.0000 mg | ORAL_TABLET | Freq: Two times a day (BID) | ORAL | Status: DC

## 2014-02-28 NOTE — Progress Notes (Signed)
   Subjective:    Dysuria  This is a new problem. The current episode started more than 1 month ago. The problem occurs intermittently. The problem has been waxing and waning. The pain is mild. There has been no fever. She is not sexually active. Associated symptoms include frequency, hematuria and urgency. Pertinent negatives include no chills, flank pain or nausea. She has tried nothing for the symptoms. There is no history of catheterization or kidney stones.     The patient presents for a follow-up of  chronic hypertension, chronic dyslipidemia, tic    Review of Systems  Constitutional: Negative for chills, activity change, appetite change, fatigue and unexpected weight change.  HENT: Negative for congestion, mouth sores and sinus pressure.   Eyes: Negative for visual disturbance.  Respiratory: Negative for cough and chest tightness.   Gastrointestinal: Negative for nausea and abdominal pain.  Genitourinary: Positive for dysuria, urgency, frequency and hematuria. Negative for flank pain, difficulty urinating and vaginal pain.  Musculoskeletal: Negative for back pain and gait problem.  Skin: Negative for pallor and rash.  Neurological: Negative for dizziness, tremors, weakness, numbness and headaches.  Psychiatric/Behavioral: Negative for confusion and sleep disturbance.   BP Readings from Last 3 Encounters:  02/28/14 150/90  01/30/14 162/88  10/30/13 144/82   Wt Readings from Last 3 Encounters:  02/28/14 179 lb (81.194 kg)  01/30/14 179 lb (81.194 kg)  10/30/13 178 lb 8 oz (80.967 kg)        Objective:   Physical Exam  Constitutional: She appears well-developed and well-nourished. No distress.  HENT:  Head: Normocephalic.  Right Ear: External ear normal.  Left Ear: External ear normal.  Nose: Nose normal.  Mouth/Throat: Oropharynx is clear and moist.  Eyes: Conjunctivae are normal. Pupils are equal, round, and reactive to light. Right eye exhibits no discharge. Left  eye exhibits no discharge.  Neck: Normal range of motion. Neck supple. No JVD present. No tracheal deviation present. No thyromegaly present.  Cardiovascular: Normal rate, regular rhythm and normal heart sounds.   Pulmonary/Chest: No stridor. No respiratory distress. She has no wheezes.  Abdominal: Soft. Bowel sounds are normal. She exhibits no distension and no mass. There is no tenderness. There is no rebound and no guarding.  Musculoskeletal: She exhibits no edema (trace B) and no tenderness.  Lymphadenopathy:    She has no cervical adenopathy.  Neurological: She displays normal reflexes. No cranial nerve deficit. She exhibits normal muscle tone. Coordination normal.  Face tic  Skin: No rash noted. No erythema.  Psychiatric: She has a normal mood and affect. Her behavior is normal. Judgment and thought content normal.     Lab Results  Component Value Date   WBC 4.9 04/30/2013   HGB 12.9 04/30/2013   HCT 38.5 04/30/2013   PLT 327.0 04/30/2013   GLUCOSE 95 04/30/2013   CHOL 243* 04/30/2013   TRIG 91.0 04/30/2013   HDL 62.40 04/30/2013   LDLDIRECT 155.6 04/30/2013   ALT 15 04/30/2013   AST 23 04/30/2013   NA 137 04/30/2013   K 4.0 04/30/2013   CL 100 04/30/2013   CREATININE 1.1 04/30/2013   BUN 21 04/30/2013   CO2 30 04/30/2013   TSH 3.58 04/30/2013    UA 3+ Leu    Assessment & Plan:

## 2014-02-28 NOTE — Assessment & Plan Note (Signed)
Risks associated with treatment noncompliance were discussed. Compliance was encouraged. 

## 2014-02-28 NOTE — Assessment & Plan Note (Addendum)
8/15 Start po Cipro RTC if hematuria re-occurred: she may need a Urology referral. She understands.

## 2014-02-28 NOTE — Progress Notes (Signed)
Pre visit review using our clinic review tool, if applicable. No additional management support is needed unless otherwise documented below in the visit note. 

## 2014-03-19 ENCOUNTER — Ambulatory Visit (INDEPENDENT_AMBULATORY_CARE_PROVIDER_SITE_OTHER): Payer: Medicare Other | Admitting: Internal Medicine

## 2014-03-19 ENCOUNTER — Other Ambulatory Visit (INDEPENDENT_AMBULATORY_CARE_PROVIDER_SITE_OTHER): Payer: Medicare Other

## 2014-03-19 ENCOUNTER — Encounter: Payer: Self-pay | Admitting: Internal Medicine

## 2014-03-19 VITALS — BP 162/88 | HR 68 | Temp 98.3°F | Wt 177.8 lb

## 2014-03-19 DIAGNOSIS — K625 Hemorrhage of anus and rectum: Secondary | ICD-10-CM | POA: Diagnosis not present

## 2014-03-19 DIAGNOSIS — R142 Eructation: Secondary | ICD-10-CM

## 2014-03-19 DIAGNOSIS — R140 Abdominal distension (gaseous): Secondary | ICD-10-CM

## 2014-03-19 DIAGNOSIS — R319 Hematuria, unspecified: Secondary | ICD-10-CM | POA: Diagnosis not present

## 2014-03-19 DIAGNOSIS — R141 Gas pain: Secondary | ICD-10-CM

## 2014-03-19 DIAGNOSIS — I10 Essential (primary) hypertension: Secondary | ICD-10-CM

## 2014-03-19 DIAGNOSIS — R143 Flatulence: Secondary | ICD-10-CM

## 2014-03-19 LAB — URINALYSIS, ROUTINE W REFLEX MICROSCOPIC
Bilirubin Urine: NEGATIVE
Hgb urine dipstick: NEGATIVE
Ketones, ur: NEGATIVE
Nitrite: NEGATIVE
SPECIFIC GRAVITY, URINE: 1.01 (ref 1.000–1.030)
Total Protein, Urine: NEGATIVE
UROBILINOGEN UA: 0.2 (ref 0.0–1.0)
Urine Glucose: NEGATIVE
pH: 7 (ref 5.0–8.0)

## 2014-03-19 LAB — CBC WITH DIFFERENTIAL/PLATELET
Basophils Absolute: 0 10*3/uL (ref 0.0–0.1)
Basophils Relative: 0.7 % (ref 0.0–3.0)
EOS PCT: 2.3 % (ref 0.0–5.0)
Eosinophils Absolute: 0.1 10*3/uL (ref 0.0–0.7)
HCT: 40.2 % (ref 36.0–46.0)
HEMOGLOBIN: 13.5 g/dL (ref 12.0–15.0)
LYMPHS ABS: 1.6 10*3/uL (ref 0.7–4.0)
Lymphocytes Relative: 26.4 % (ref 12.0–46.0)
MCHC: 33.5 g/dL (ref 30.0–36.0)
MCV: 86.6 fl (ref 78.0–100.0)
MONOS PCT: 10.5 % (ref 3.0–12.0)
Monocytes Absolute: 0.6 10*3/uL (ref 0.1–1.0)
Neutro Abs: 3.5 10*3/uL (ref 1.4–7.7)
Neutrophils Relative %: 60.1 % (ref 43.0–77.0)
Platelets: 333 10*3/uL (ref 150.0–400.0)
RBC: 4.65 Mil/uL (ref 3.87–5.11)
RDW: 13.8 % (ref 11.5–15.5)
WBC: 5.9 10*3/uL (ref 4.0–10.5)

## 2014-03-19 LAB — BASIC METABOLIC PANEL
BUN: 15 mg/dL (ref 6–23)
CALCIUM: 9.7 mg/dL (ref 8.4–10.5)
CO2: 28 mEq/L (ref 19–32)
Chloride: 103 mEq/L (ref 96–112)
Creatinine, Ser: 1.2 mg/dL (ref 0.4–1.2)
GFR: 57 mL/min — AB (ref >60.00)
Glucose, Bld: 89 mg/dL (ref 70–99)
Potassium: 3.1 mEq/L — ABNORMAL LOW (ref 3.5–5.1)
SODIUM: 139 meq/L (ref 135–145)

## 2014-03-19 LAB — HEPATIC FUNCTION PANEL
ALBUMIN: 3.8 g/dL (ref 3.5–5.2)
ALT: 19 U/L (ref 0–35)
AST: 26 U/L (ref 0–37)
Alkaline Phosphatase: 75 U/L (ref 39–117)
Bilirubin, Direct: 0.1 mg/dL (ref 0.0–0.3)
Total Bilirubin: 0.6 mg/dL (ref 0.2–1.2)
Total Protein: 7.7 g/dL (ref 6.0–8.3)

## 2014-03-19 NOTE — Progress Notes (Signed)
Pre visit review using our clinic review tool, if applicable. No additional management support is needed unless otherwise documented below in the visit note. 

## 2014-03-19 NOTE — Patient Instructions (Signed)
Your next office appointment will be determined based upon review of your pending labs. Those instructions will be transmitted to you  by mail. Followup as needed for your acute issue. Please report any significant change in your symptoms.  Minimal Blood Pressure Goal= AVERAGE < 140/90;  Ideal is an AVERAGE < 135/85. This AVERAGE should be calculated from @ least 5-7 BP readings taken @ different times of day on different days of week. You should not respond to isolated BP readings , but rather the AVERAGE for that week .Please bring your  blood pressure cuff to office visits to verify that it is reliable.It  can also be checked against the blood pressure device at the pharmacy. Finger or wrist cuffs are not dependable; an arm cuff is.

## 2014-03-19 NOTE — Progress Notes (Signed)
   Subjective:    Patient ID: Laurie Pratt, female    DOB: [birth year], 73 y.o.   MRN: [id number]  HPI  Her history is difficult to follow as it varies with attempts @ clarification.  Initially she stated that she had had some "spotting" for 9 months. Actually initial symptom appears to have been some bloating of the abdomen intermittently for 9 months  She's had some spots of blood in the urine over the last 10 days.  She noted blood in the toilet water 9/8 and today.  She was seen 02/28/14 (reviewed) and placed on Cipro for apparent urinary tract infection. No C&S  In EMR.  She states that she had a colonoscopy last year which revealed a polyp. Her colonoscopy was actually 2011.   She also describes apparent laparoscopic surgery in August of last year.   Apparently she had abnormal Pap smear done by Dr. Toney Rakes. He referred her to Dr. Aldean Ast who found low-high-grade squamous intraepithelial, CIN 1-III (mild to severe dysplasia/CIS) with endocervical glandular extension 03/12/13. There was high-grade dysplasia present at the ectocervical margin. She states that she has not been seen in followup since that date  She still has her uterus and ovaries.   Last labs on record were 10/14. She has no other bleeding dyscrasias. She also denies genitourinary symptoms otherwise.      Review of Systems  Specifically she denies epistaxis, hemoptysis, abnormal bruising, or difficulty stopping the bleeding when cut.  She denies hesitancy, urgency, frequency, dysuria, pyuria, flank pain, fever, chills, or sweats.  There is no history of recurrent urinary tract infections or renal calculi. She's never had a cystoscopy.      Objective:   Physical Exam  Pertinent or positive findings include: There is an intermittent resting tremor of the mandible. She has an upper plate and lower partial. There's mild crepitus of the knees with some fusiform change. Slight clubbing of the  nailbeds.  General appearance :adequately nourished; in no distress. Appears younger than stated age Eyes: No conjunctival inflammation or scleral icterus is present. Oral exam: Lips and gums are healthy appearing.There is no oropharyngeal erythema or exudate noted.  Heart:  Normal rate and regular rhythm. S1 and S2 normal without gallop, murmur, click, rub or other extra sounds   Lungs:Chest clear to auscultation; no wheezes, rhonchi,rales ,or rubs present.No increased work of breathing.  Abdomen: bowel sounds normal, soft and non-tender without masses, organomegaly or hernias noted.  No guarding or rebound. No flank tenderness to percussion. Skin:Warm & dry.  Intact without suspicious lesions or rashes ; no jaundice or tenting Lymphatic: No lymphadenopathy is noted about the head, neck, axilla            Assessment & Plan:  #1 intermittent hematuria  #2 intermittent rectal bleeding as of 9/8  #3 history of bloating in context of cervical cancer 9/14; rule out gynecologic pathology  #4 HTN  Plan: See orders and recommendations

## 2014-03-20 ENCOUNTER — Other Ambulatory Visit: Payer: Self-pay | Admitting: Internal Medicine

## 2014-03-20 ENCOUNTER — Ambulatory Visit: Payer: Medicare Other

## 2014-03-20 DIAGNOSIS — N390 Urinary tract infection, site not specified: Secondary | ICD-10-CM

## 2014-03-20 DIAGNOSIS — R140 Abdominal distension (gaseous): Secondary | ICD-10-CM

## 2014-03-20 DIAGNOSIS — C539 Malignant neoplasm of cervix uteri, unspecified: Secondary | ICD-10-CM

## 2014-03-21 LAB — URINE CULTURE
Colony Count: NO GROWTH
Organism ID, Bacteria: NO GROWTH

## 2014-04-06 ENCOUNTER — Other Ambulatory Visit: Payer: Self-pay | Admitting: Internal Medicine

## 2014-04-14 ENCOUNTER — Encounter: Payer: Medicare Other | Admitting: Gynecology

## 2014-04-16 ENCOUNTER — Ambulatory Visit (INDEPENDENT_AMBULATORY_CARE_PROVIDER_SITE_OTHER): Payer: Medicare Other | Admitting: Gynecology

## 2014-04-16 ENCOUNTER — Encounter: Payer: Self-pay | Admitting: Gynecology

## 2014-04-16 ENCOUNTER — Other Ambulatory Visit (HOSPITAL_COMMUNITY)
Admission: RE | Admit: 2014-04-16 | Discharge: 2014-04-16 | Disposition: A | Discharge status: Home / Self Care | Payer: Medicare Other | Source: Ambulatory Visit | Attending: Gynecology | Admitting: Gynecology

## 2014-04-16 VITALS — BP 146/84 | Ht 66.5 in | Wt 171.0 lb

## 2014-04-16 DIAGNOSIS — D069 Carcinoma in situ of cervix, unspecified: Secondary | ICD-10-CM

## 2014-04-16 DIAGNOSIS — N952 Postmenopausal atrophic vaginitis: Secondary | ICD-10-CM

## 2014-04-16 DIAGNOSIS — R140 Abdominal distension (gaseous): Secondary | ICD-10-CM

## 2014-04-16 DIAGNOSIS — R87810 Cervical high risk human papillomavirus (HPV) DNA test positive: Secondary | ICD-10-CM | POA: Diagnosis not present

## 2014-04-16 DIAGNOSIS — Z1151 Encounter for screening for human papillomavirus (HPV): Secondary | ICD-10-CM | POA: Diagnosis not present

## 2014-04-16 DIAGNOSIS — Z01411 Encounter for gynecological examination (general) (routine) with abnormal findings: Secondary | ICD-10-CM | POA: Diagnosis not present

## 2014-04-16 DIAGNOSIS — K5900 Constipation, unspecified: Secondary | ICD-10-CM

## 2014-04-16 DIAGNOSIS — Z86010 Personal history of colonic polyps: Secondary | ICD-10-CM

## 2014-04-16 DIAGNOSIS — Z124 Encounter for screening for malignant neoplasm of cervix: Secondary | ICD-10-CM

## 2014-04-16 NOTE — Progress Notes (Signed)
Laurie Pratt 02-25-1941 [id number]   History:    72 y.o.  for GYN followup exam. Patient was seen in the office on 02/14/2013 as a new patient who was complaining at that time a postcoital bleeding when she became sexually active. She had not had a gynecological exam or Pap smear 4 years prior to that. She had reported normal Pap smears prior to that incident. Her Pap smear demonstrated the following:  Diagnosis SQUAMOUS CELL CARCINOMA.  Patient was then referred to GYN oncologist Dr. Charlaine Dalton. He subsequently did cervical biopsies and eventually took the patient to the operating room September 2 and did a cold knife cervical conization with pathology report as follows:  Diagnosis Cervix, cone - LOW TO HIGH GRADE SQUAMOUS INTRAEPITHELIAL LESION, CIN I-III (MILD TO SEVERE DYSPLASIA/CIS) WITH ENDOCERVICAL GLANDULAR EXTENSION, SEE COMMENT. - HIGH GRADE DYSPLASIA PRESENT AT ECTOCERVICAL MARGIN. - ENDOCERVICAL MARGIN, NEGATIVE FOR DYSPLASIA. Microscopic Comment There is extensive dysplasia present demonstrating a range of grade from mild to severe squamous dysplasia/in situ carcinoma. The ectocervical margin is involved by high grade dysplasia at the 3 to 6 o'clock and 9 to 12 o'clock aspect of the tissue. The findings correlate with the previous Pap test results  The patient was last seen by him of October 2014 and he had recommended abdominal hysterectomy with bilateral salpingo-oophorectomy but patient refused at the present time.  She was asymptomatic today with the exception of abdominal bloating on and off and constipation. Patient had benign colonic polyps removed in 2011.    Past medical history,surgical history, family history and social history were all reviewed and documented in the EPIC chart.  Gynecologic History No LMP recorded. Patient is postmenopausal. Contraception: post menopausal status Last Pap: See above. Results were: See above Last mammogram:  2014. Results were: normal  Obstetric History OB History  Gravida Para Term Preterm AB SAB TAB Ectopic Multiple Living  4 3   1 1    3     # Outcome Date GA Lbr Len/2nd Weight Sex Delivery Anes PTL Lv  4 SAB           3 PAR           2 PAR           1 PAR                ROS: A ROS was performed and pertinent positives and negatives are included in the history.  GENERAL: No fevers or chills. HEENT: No change in vision, no earache, sore throat or sinus congestion. NECK: No pain or stiffness. CARDIOVASCULAR: No chest pain or pressure. No palpitations. PULMONARY: No shortness of breath, cough or wheeze. GASTROINTESTINAL: No abdominal pain, nausea, vomiting or diarrhea, melena or bright red blood per rectum. GENITOURINARY: No urinary frequency, urgency, hesitancy or dysuria. MUSCULOSKELETAL: No joint or muscle pain, no back pain, no recent trauma. DERMATOLOGIC: No rash, no itching, no lesions. ENDOCRINE: No polyuria, polydipsia, no heat or cold intolerance. No recent change in weight. HEMATOLOGICAL: No anemia or easy bruising or bleeding. NEUROLOGIC: No headache, seizures, numbness, tingling or weakness. PSYCHIATRIC: No depression, no loss of interest in normal activity or change in sleep pattern.     Exam: chaperone present  BP 146/84  Ht 5' 6.5" (1.689 m)  Wt 171 lb (77.565 kg)  BMI 27.19 kg/m2  Body mass index is 27.19 kg/(m^2).  General appearance : Well developed well nourished female. No acute distress HEENT: Neck supple, trachea midline,  no carotid bruits, no thyroidmegaly Lungs: Clear to auscultation, no rhonchi or wheezes, or rib retractions  Heart: Regular rate and rhythm, no murmurs or gallops Breast:Examined in sitting and supine position were symmetrical in appearance, no palpable masses or tenderness,  no skin retraction, no nipple inversion, no nipple discharge, no skin discoloration, no axillary or supraclavicular lymphadenopathy Abdomen: no palpable masses or tenderness,  no rebound or guarding Extremities: no edema or skin discoloration or tenderness  Pelvic:  Bartholin, Urethra, Skene Glands: Within normal limits             Vagina: Atrophic and friable on contact  Cervix: Flat and friable on contact  Uterus  axial nontender  Adnexa  Without masses or tenderness  Anus and perineum  normal   Rectovaginal  normal sphincter tone without palpated masses or tenderness             Hemoccult PCP provides     Assessment/Plan:  73 y.o. female for annual exam with carcinoma in situ on cold knife cervical conization with positive margins had been recommended by GYN oncologist to proceed with total abdominal hysterectomy and bilateral salpingo-oophorectomy the patient had opted not to do so last year. No cervical lesions seen today although friable on contact as was vaginal mucosa a result of severe vaginal atrophy. Patient then complained on and off of abdominal bloating. We will have her return to the office next week for a pelvic ultrasound as well as to discuss the pathology report. We'll probably consider starting her on vaginal estrogen for severe vaginal atrophy. For her constipation she will use MiraLax 1 tablespoon daily. She was reminded to schedule her mammogram. She was given a prescription to schedule her shingles vaccine. Her PCP should be doing her bone density this year.   Terrance Mass MD, 12:14 PM 04/16/2014

## 2014-04-16 NOTE — Patient Instructions (Signed)
Shingles Vaccine What You Need to Know WHAT IS SHINGLES?  Shingles is a painful skin rash, often with blisters. It is also called Herpes Zoster or just Zoster.  A shingles rash usually appears on one side of the face or body and lasts from 2 to 4 weeks. Its main symptom is pain, which can be quite severe. Other symptoms of shingles can include fever, headache, chills, and upset stomach. Very rarely, a shingles infection can lead to pneumonia, hearing problems, blindness, brain inflammation (encephalitis), or death.  For about 1 person in 5, severe pain can continue even after the rash clears up. This is called post-herpetic neuralgia.  Shingles is caused by the Varicella Zoster virus. This is the same virus that causes chickenpox. Only someone who has had a case of chickenpox or rarely, has gotten chickenpox vaccine, can get shingles. The virus stays in your body. It can reappear many years later to cause a case of shingles.  You cannot catch shingles from another person with shingles. However, a person who has never had chickenpox (or chickenpox vaccine) could get chickenpox from someone with shingles. This is not very common.  Shingles is far more common in people 50 and older than in younger people. It is also more common in people whose immune systems are weakened because of a disease such as cancer or drugs such as steroids or chemotherapy.  At least 1 million people get shingles per year in the [place]. SHINGLES VACCINE  A vaccine for shingles was licensed in 2006. In clinical trials, the vaccine reduced the risk of shingles by 50%. It can also reduce the pain in people who still get shingles after being vaccinated.  A single dose of shingles vaccine is recommended for adults 60 years of age and older. SOME PEOPLE SHOULD NOT GET SHINGLES VACCINE OR SHOULD WAIT A person should not get shingles vaccine if he or she:  Has ever had a life-threatening allergic reaction to gelatin, the  antibiotic neomycin, or any other component of shingles vaccine. Tell your caregiver if you have any severe allergies.  Has a weakened immune system because of current:  AIDS or another disease that affects the immune system.  Treatment with drugs that affect the immune system, such as prolonged use of high-dose steroids.  Cancer treatment, such as radiation or chemotherapy.  Cancer affecting the bone marrow or lymphatic system, such as leukemia or lymphoma.  Is pregnant, or might be pregnant. Women should not become pregnant until at least 4 weeks after getting shingles vaccine. Someone with a minor illness, such as a cold, may be vaccinated. Anyone with a moderate or severe acute illness should usually wait until he or she recovers before getting the vaccine. This includes anyone with a temperature of 101.3 F (38 C) or higher. WHAT ARE THE RISKS FROM SHINGLES VACCINE?  A vaccine, like any medicine, could possibly cause serious problems, such as severe allergic reactions. However, the risk of a vaccine causing serious harm, or death, is extremely small.  No serious problems have been identified with shingles vaccine. Mild Problems  Redness, soreness, swelling, or itching at the site of the injection (about 1 person in 3).  Headache (about 1 person in 70). Like all vaccines, shingles vaccine is being closely monitored for unusual or severe problems. WHAT IF THERE IS A MODERATE OR SEVERE REACTION? What should I look for? Any unusual condition, such as a severe allergic reaction or a high fever. If a severe allergic reaction   occurred, it would be within a few minutes to an hour after the shot. Signs of a serious allergic reaction can include difficulty breathing, weakness, hoarseness or wheezing, a fast heartbeat, hives, dizziness, paleness, or swelling of the throat. What should I do?  Call your caregiver, or get the person to a caregiver right away.  Tell the caregiver what  happened, the date and time it happened, and when the vaccination was given.  Ask the caregiver to report the reaction by filing a Vaccine Adverse Event Reporting System (VAERS) form. Or, you can file this report through the VAERS web site at www.vaers.SamedayNews.es or by calling 786-588-0131. VAERS does not provide medical advice. HOW CAN I LEARN MORE?  Ask your caregiver. He or she can give you the vaccine package insert or suggest other sources of information.  Contact the Centers for Disease Control and Prevention (CDC):  Call (365)184-7312 (1-800-CDC-INFO).  Visit the CDC website at http://hunter.com/ CDC Shingles Vaccine VIS (04/15/08) Document Released: 04/24/2006 Document Revised: 09/19/2011 Document Reviewed: 10/17/2012 [facility] Patient Information 2015 [place]. This information is not intended to replace advice given to you by your health care provider. Make sure you discuss any questions you have with your health care provider. Tdap Vaccine (Tetanus, Diphtheria, Pertussis): What You Need to Know 1. Why get vaccinated? Tetanus, diphtheria and pertussis can be very serious diseases, even for adolescents and adults. Tdap vaccine can protect [place] from these diseases. TETANUS (Lockjaw) causes painful muscle tightening and stiffness, usually all over the body.  It can lead to tightening of muscles in the head and neck so you can't open your mouth, swallow, or sometimes even breathe. Tetanus kills about 1 out of 5 people who are infected. DIPHTHERIA can cause a thick coating to form in the back of the throat.  It can lead to breathing problems, paralysis, heart failure, and death. PERTUSSIS (Whooping Cough) causes severe coughing spells, which can cause difficulty breathing, vomiting and disturbed sleep.  It can also lead to weight loss, incontinence, and rib fractures. Up to 2 in 100 adolescents and 5 in 100 adults with pertussis are hospitalized or have complications, which could  include pneumonia or death. These diseases are caused by bacteria. Diphtheria and pertussis are spread from person to person through coughing or sneezing. Tetanus enters the body through cuts, scratches, or wounds. Before vaccines, the [place] saw as many as 200,000 cases a year of diphtheria and pertussis, and hundreds of cases of tetanus. Since vaccination began, tetanus and diphtheria have dropped by about 99% and pertussis by about 80%. 2. Tdap vaccine Tdap vaccine can protect adolescents and adults from tetanus, diphtheria, and pertussis. One dose of Tdap is routinely given at age 16 or 73. People who did not get Tdap at that age should get it as soon as possible. Tdap is especially important for health care professionals and anyone having close contact with a baby younger than 12 months. Pregnant women should get a dose of Tdap during every pregnancy, to protect the newborn from pertussis. Infants are most at risk for severe, life-threatening complications from pertussis. A similar vaccine, called Td, protects from tetanus and diphtheria, but not pertussis. A Td booster should be given every 10 years. Tdap may be given as one of these boosters if you have not already gotten a dose. Tdap may also be given after a severe cut or burn to prevent tetanus infection. Your doctor can give you more information. Tdap may safely be given at the  same time as other vaccines. 3. Some people should not get this vaccine  If you ever had a life-threatening allergic reaction after a dose of any tetanus, diphtheria, or pertussis containing vaccine, OR if you have a severe allergy to any part of this vaccine, you should not get Tdap. Tell your doctor if you have any severe allergies.  If you had a coma, or long or multiple seizures within 7 days after a childhood dose of DTP or DTaP, you should not get Tdap, unless a cause other than the vaccine was found. You can still get Td.  Talk to your doctor if  you:  have epilepsy or another nervous system problem,  had severe pain or swelling after any vaccine containing diphtheria, tetanus or pertussis,  ever had Guillain-Barr Syndrome (GBS),  aren't feeling well on the day the shot is scheduled. 4. Risks of a vaccine reaction With any medicine, including vaccines, there is a chance of side effects. These are usually mild and go away on their own, but serious reactions are also possible. Brief fainting spells can follow a vaccination, leading to injuries from falling. Sitting or lying down for about 15 minutes can help prevent these. Tell your doctor if you feel dizzy or light-headed, or have vision changes or ringing in the ears. Mild problems following Tdap (Did not interfere with activities)  Pain where the shot was given (about 3 in 4 adolescents or 2 in 3 adults)  Redness or swelling where the shot was given (about 1 person in 5)  Mild fever of at least 100.37F (up to about 1 in 25 adolescents or 1 in 100 adults)  Headache (about 3 or 4 people in 10)  Tiredness (about 1 person in 3 or 4)  Nausea, vomiting, diarrhea, stomach ache (up to 1 in 4 adolescents or 1 in 10 adults)  Chills, body aches, sore joints, rash, swollen glands (uncommon) Moderate problems following Tdap (Interfered with activities, but did not require medical attention)  Pain where the shot was given (about 1 in 5 adolescents or 1 in 100 adults)  Redness or swelling where the shot was given (up to about 1 in 16 adolescents or 1 in 25 adults)  Fever over 102F (about 1 in 100 adolescents or 1 in 250 adults)  Headache (about 3 in 20 adolescents or 1 in 10 adults)  Nausea, vomiting, diarrhea, stomach ache (up to 1 or 3 people in 100)  Swelling of the entire arm where the shot was given (up to about 3 in 100). Severe problems following Tdap (Unable to perform usual activities; required medical attention)  Swelling, severe pain, bleeding and redness in the  arm where the shot was given (rare). A severe allergic reaction could occur after any vaccine (estimated less than 1 in a million doses). 5. What if there is a serious reaction? What should I look for?  Look for anything that concerns you, such as signs of a severe allergic reaction, very high fever, or behavior changes. Signs of a severe allergic reaction can include hives, swelling of the face and throat, difficulty breathing, a fast heartbeat, dizziness, and weakness. These would start a few minutes to a few hours after the vaccination. What should I do?  If you think it is a severe allergic reaction or other emergency that can't wait, call 9-1-1 or get the person to the nearest hospital. Otherwise, call your doctor.  Afterward, the reaction should be reported to the "Vaccine Adverse Event Reporting System" (  VAERS). Your doctor might file this report, or you can do it yourself through the VAERS web site at www.vaers.SamedayNews.es, or by calling (406)417-2578. VAERS is only for reporting reactions. They do not give medical advice.  6. The National Vaccine Injury Compensation Program The [employer] Vaccine Injury Compensation Program (VICP) is a federal program that was created to compensate people who may have been injured by certain vaccines. Persons who believe they may have been injured by a vaccine can learn about the program and about filing a claim by calling (913)628-6201 or visiting the [place] website at GoldCloset.com.ee. 7. How can I learn more?  Ask your doctor.  Call your local or state health department.  Contact the Centers for Disease Control and Prevention (CDC):  Call 223-454-9689 or visit CDC's website at http://hunter.com/. CDC Tdap Vaccine VIS (11/17/11) Document Released: 12/27/2011 Document Revised: 11/11/2013 Document Reviewed: 10/09/2013 [employer] Patient Information 2015 [place], [place]. This information is not intended to replace advice given to you by  your health care provider. Make sure you discuss any questions you have with your health care provider.

## 2014-04-21 LAB — CYTOLOGY - PAP

## 2014-05-01 ENCOUNTER — Encounter: Payer: Medicare Other | Admitting: Internal Medicine

## 2014-05-02 ENCOUNTER — Ambulatory Visit (INDEPENDENT_AMBULATORY_CARE_PROVIDER_SITE_OTHER): Payer: Medicare Other

## 2014-05-02 ENCOUNTER — Other Ambulatory Visit: Payer: Self-pay | Admitting: Gynecology

## 2014-05-02 ENCOUNTER — Ambulatory Visit (INDEPENDENT_AMBULATORY_CARE_PROVIDER_SITE_OTHER): Payer: Medicare Other | Admitting: Gynecology

## 2014-05-02 ENCOUNTER — Encounter: Payer: Self-pay | Admitting: Gynecology

## 2014-05-02 DIAGNOSIS — D251 Intramural leiomyoma of uterus: Secondary | ICD-10-CM | POA: Diagnosis not present

## 2014-05-02 DIAGNOSIS — Z87410 Personal history of cervical dysplasia: Secondary | ICD-10-CM

## 2014-05-02 DIAGNOSIS — R140 Abdominal distension (gaseous): Secondary | ICD-10-CM

## 2014-05-02 DIAGNOSIS — D069 Carcinoma in situ of cervix, unspecified: Secondary | ICD-10-CM | POA: Diagnosis not present

## 2014-05-02 DIAGNOSIS — N83319 Acquired atrophy of ovary, unspecified side: Secondary | ICD-10-CM

## 2014-05-02 DIAGNOSIS — N8331 Acquired atrophy of ovary: Secondary | ICD-10-CM

## 2014-05-02 NOTE — Progress Notes (Signed)
   Patient presented to the office today for her ultrasound. She was seen the office for her annual exam October 7 had complained some abdominal bloating and had some vaginismus making pelvic exam and adequate.  Ultrasound today: Uterus measured 5.5 x 4.2 x 3.5 cm with endometrial stripe of 2.6 mm. Patient had one isolated small posterior fibroid measured 17 x 18 x 16 mm. Right ovary was atrophic left ovary not seen. Endometrial stripe was 2.6 mm. Excess of bowel shadowing was noted in the left adnexa. No fluid in the cul-de-sac.  Patient with past dysplasia history as follows:  Patient was seen the office on August 7 as a new patient and had beencomplaining at that time a postcoital bleeding when she became sexually active. She had not had a gynecological exam or Pap smear 4 years prior to that. She had reported normal Pap smears prior to that incident. Her Pap smear demonstrated the following:  Diagnosis  SQUAMOUS CELL CARCINOMA.  Patient was then referred to GYN oncologist Dr. Charlaine Dalton. He subsequently did cervical biopsies and eventually took the patient to the operating room September 2 and did a cold knife cervical conization with pathology report as follows:  Diagnosis  Cervix, cone  - LOW TO HIGH GRADE SQUAMOUS INTRAEPITHELIAL LESION, CIN I-III (MILD TO SEVERE  DYSPLASIA/CIS) WITH ENDOCERVICAL GLANDULAR EXTENSION, SEE COMMENT.  - HIGH GRADE DYSPLASIA PRESENT AT ECTOCERVICAL MARGIN.  - ENDOCERVICAL MARGIN, NEGATIVE FOR DYSPLASIA.  Microscopic Comment  There is extensive dysplasia present demonstrating a range of grade from mild to severe squamous  dysplasia/in situ carcinoma. The ectocervical margin is involved by high grade dysplasia at the 3 to 6 o'clock  and 9 to 12 o'clock aspect of the tissue. The findings correlate with the previous Pap test results  The patient was last seen by him of October 2014 and he had recommended abdominal hysterectomy with bilateral  salpingo-oophorectomy but patient refused at the present time.   Her Pap smear October October 7 demonstrated the following: Diagnosis LOW GRADE SQUAMOUS INTRAEPITHELIAL LESION: CIN-1/ HPV (LSIL). RECOMMENDATION THERE ARE A FEW CELLS SUGGESTIVE OF A HIGHER GRADE LESION. CLINICAL CORRELATION IS RECOMMENDED.  Patient scheduled for next week for colposcopic evaluation. Patient has made up her mind that there is high-risk dysplasia that she will proceed with hysterectomy is recommended by GYN oncologist. Literature information was provided.

## 2014-05-02 NOTE — Patient Instructions (Signed)
Colposcopy Colposcopy is a procedure to examine your cervix and vagina, or the area around the outside of your vagina, for abnormalities or signs of disease. The procedure is done using a lighted microscope called a colposcope. Tissue samples may be collected during the colposcopy if your health care provider finds any unusual cells. A colposcopy may be done if a woman has:  An abnormal Pap test. A Pap test is a medical test done to evaluate cells that are on the surface of the cervix.  A Pap test result that is suggestive of human papillomavirus (HPV). This virus can cause genital warts and is linked to the development of cervical cancer.  A sore on her cervix and the results of a Pap test were normal.  Genital warts on the cervix or in or around the outside of the vagina.  A mother who took the drug diethylstilbestrol (DES) while pregnant.  Painful intercourse.  Vaginal bleeding, especially after sexual intercourse. LET YOUR HEALTH CARE PROVIDER KNOW ABOUT:  Any allergies you have.  All medicines you are taking, including vitamins, herbs, eye drops, creams, and over-the-counter medicines.  Previous problems you or members of your family have had with the use of anesthetics.  Any blood disorders you have.  Previous surgeries you have had.  Medical conditions you have. RISKS AND COMPLICATIONS Generally, a colposcopy is a safe procedure. However, as with any procedure, complications can occur. Possible complications include:  Bleeding.  Infection.  Missed lesions. BEFORE THE PROCEDURE   Tell your health care provider if you have your menstrual period. A colposcopy typically is not done during menstruation.  For 24 hours before the colposcopy, do not:  [place].  Use tampons.  Use medicines, creams, or suppositories in the vagina.  Have sexual intercourse. PROCEDURE  During the procedure, you will be lying on your back with your feet in foot rests (stirrups). A warm  metal or plastic instrument (speculum) will be placed in your vagina to keep it open and to allow the health care provider to see the cervix. The colposcope will be placed outside the vagina. It will be used to magnify and examine the cervix, vagina, and the area around the outside of the vagina. A small amount of liquid solution will be placed on the area that is to be viewed. This solution will make it easier to see the abnormal cells. Your health care provider will use tools to suck out mucus and cells from the canal of the cervix. Then he or she will record the location of the abnormal areas. If a biopsy is done during the procedure, a medicine will usually be given to numb the area (local anesthetic). You may feel mild pain or cramping while the biopsy is done. After the procedure, tissue samples collected during the biopsy will be sent to a lab for analysis. AFTER THE PROCEDURE  You will be given instructions on when to follow up with your health care provider for your test results. It is important to keep your appointment. Document Released: 09/17/2002 Document Revised: 02/27/2013 Document Reviewed: 01/24/2013 Exit[employer] Patient Information 2015 [employer]. This information is not intended to replace advice given to you by your health care provider. Make sure you discuss any questions you have with your health care provider.  

## 2014-05-06 ENCOUNTER — Encounter: Payer: Medicare Other | Admitting: Internal Medicine

## 2014-05-12 ENCOUNTER — Encounter: Payer: Self-pay | Admitting: Gynecology

## 2014-05-14 ENCOUNTER — Encounter: Payer: Self-pay | Admitting: Gynecology

## 2014-05-14 ENCOUNTER — Ambulatory Visit (INDEPENDENT_AMBULATORY_CARE_PROVIDER_SITE_OTHER): Payer: Medicare Other | Admitting: Gynecology

## 2014-05-14 VITALS — BP 138/82

## 2014-05-14 DIAGNOSIS — Z87410 Personal history of cervical dysplasia: Secondary | ICD-10-CM

## 2014-05-14 DIAGNOSIS — N870 Mild cervical dysplasia: Secondary | ICD-10-CM | POA: Diagnosis not present

## 2014-05-14 NOTE — Patient Instructions (Signed)
Colposcopy Colposcopy is a procedure to examine your cervix and vagina, or the area around the outside of your vagina, for abnormalities or signs of disease. The procedure is done using a lighted microscope called a colposcope. Tissue samples may be collected during the colposcopy if your health care provider finds any unusual cells. A colposcopy may be done if a woman has:  An abnormal Pap test. A Pap test is a medical test done to evaluate cells that are on the surface of the cervix.  A Pap test result that is suggestive of human papillomavirus (HPV). This virus can cause genital warts and is linked to the development of cervical cancer.  A sore on her cervix and the results of a Pap test were normal.  Genital warts on the cervix or in or around the outside of the vagina.  A mother who took the drug diethylstilbestrol (DES) while pregnant.  Painful intercourse.  Vaginal bleeding, especially after sexual intercourse. LET YOUR HEALTH CARE PROVIDER KNOW ABOUT:  Any allergies you have.  All medicines you are taking, including vitamins, herbs, eye drops, creams, and over-the-counter medicines.  Previous problems you or members of your family have had with the use of anesthetics.  Any blood disorders you have.  Previous surgeries you have had.  Medical conditions you have. RISKS AND COMPLICATIONS Generally, a colposcopy is a safe procedure. However, as with any procedure, complications can occur. Possible complications include:  Bleeding.  Infection.  Missed lesions. BEFORE THE PROCEDURE   Tell your health care provider if you have your menstrual period. A colposcopy typically is not done during menstruation.  For 24 hours before the colposcopy, do not:  [place].  Use tampons.  Use medicines, creams, or suppositories in the vagina.  Have sexual intercourse. PROCEDURE  During the procedure, you will be lying on your back with your feet in foot rests (stirrups). A warm  metal or plastic instrument (speculum) will be placed in your vagina to keep it open and to allow the health care provider to see the cervix. The colposcope will be placed outside the vagina. It will be used to magnify and examine the cervix, vagina, and the area around the outside of the vagina. A small amount of liquid solution will be placed on the area that is to be viewed. This solution will make it easier to see the abnormal cells. Your health care provider will use tools to suck out mucus and cells from the canal of the cervix. Then he or she will record the location of the abnormal areas. If a biopsy is done during the procedure, a medicine will usually be given to numb the area (local anesthetic). You may feel mild pain or cramping while the biopsy is done. After the procedure, tissue samples collected during the biopsy will be sent to a lab for analysis. AFTER THE PROCEDURE  You will be given instructions on when to follow up with your health care provider for your test results. It is important to keep your appointment. Document Released: 09/17/2002 Document Revised: 02/27/2013 Document Reviewed: 01/24/2013 Exit[employer] Patient Information 2015 [employer]. This information is not intended to replace advice given to you by your health care provider. Make sure you discuss any questions you have with your health care provider.  

## 2014-05-14 NOTE — Progress Notes (Signed)
   73 year old patient presented to the office for colonoscopy as a result of her recent abnormal Pap smear. Her dysplasia history and GYN oncology consultation as follows:  Patient was seen the office on August 7 as a new patient and had beencomplaining at that time a postcoital bleeding when she became sexually active. She had not had a gynecological exam or Pap smear 4 years prior to that. She had reported normal Pap smears prior to that incident. Her Pap smear demonstrated the following:  Diagnosis  SQUAMOUS CELL CARCINOMA.  Patient was then referred to GYN oncologist Dr. Charlaine Dalton. He subsequently did cervical biopsies and eventually took the patient to the operating room September 2 and did a cold knife cervical conization in 2014 with pathology report as follows:  Diagnosis  Cervix, cone  - LOW TO HIGH GRADE SQUAMOUS INTRAEPITHELIAL LESION, CIN I-III (MILD TO SEVERE  DYSPLASIA/CIS) WITH ENDOCERVICAL GLANDULAR EXTENSION, SEE COMMENT.  - HIGH GRADE DYSPLASIA PRESENT AT ECTOCERVICAL MARGIN.  - ENDOCERVICAL MARGIN, NEGATIVE FOR DYSPLASIA.  Microscopic Comment  There is extensive dysplasia present demonstrating a range of grade from mild to severe squamous  dysplasia/in situ carcinoma. The ectocervical margin is involved by high grade dysplasia at the 3 to 6 o'clock  and 9 to 12 o'clock aspect of the tissue. The findings correlate with the previous Pap test results  The patient was last seen by him of October 2014 and he had recommended abdominal hysterectomy with bilateral salpingo-oophorectomy but patient refused at the present time.   Her Pap smear October October 7 demonstrated the following: Diagnosis LOW GRADE SQUAMOUS INTRAEPITHELIAL LESION: CIN-1/ HPV (LSIL). RECOMMENDATION THERE ARE A FEW CELLS SUGGESTIVE OF A HIGHER GRADE LESION. CLINICAL CORRELATION IS RECOMMENDED.  This is the reason for today colposcopy.  Patient underwent a detail coloscopic  evaluation. The external genitalia, perineum perirectal region were inspected no lesions were seen. The speculum was introduced into the vagina. This to systematic inspection demonstrated a very friable cervix which was almost flat with the vagina. Cervical os could not be dilated effort to do an ECC. There was leukoplakic area at the 12:00 position as well as the 3 and 9:00 position which were respectively biopsied. Silver nitrate was used for hemostasis.  Assessment/plan: Patient with cervical cone biopsy 2014 indicating CIN-1-CIN-3 with endocervical glandular extension high-grade dysplasia present at the ectocervical margin. Endocervical margin was reportedly negative for dysplasia. Follow-up Pap smear this year with low-grade dysplasia suspicious of higher grade lesion was the reason for today's biopsy. Results pending at time of this dictation will manage accordingly with GYN oncology consultation once again.

## 2014-05-20 ENCOUNTER — Telehealth: Payer: Self-pay

## 2014-05-20 NOTE — Telephone Encounter (Signed)
Okay thank you

## 2014-05-20 NOTE — Telephone Encounter (Addendum)
Received the following staff message from Dr. Moshe Salisbury:   Juliann Pulse, please call this patient tell her that I spoke with Dr. Marti Sleigh and we all agree that she should proceed with a total abdominal hysterectomy and bilateral salpingo-oophorectomy and that needed to schedule it for [place] here and not with him. I will need to see her for preop consultation. I will need medical clearance from her primary care physician before surgery.  I called patient and informed her.  She will start the process of getting medical clearance. I told her it would likely be mid January before I could schedule her. She is fine with that.  Dr. JF-Is mid January acceptable for her surgery?

## 2014-05-28 ENCOUNTER — Encounter: Payer: Self-pay | Admitting: Neurology

## 2014-06-03 ENCOUNTER — Encounter: Payer: Self-pay | Admitting: Neurology

## 2014-06-09 ENCOUNTER — Encounter: Payer: Self-pay | Admitting: Internal Medicine

## 2014-06-09 ENCOUNTER — Ambulatory Visit (INDEPENDENT_AMBULATORY_CARE_PROVIDER_SITE_OTHER): Payer: Medicare Other | Admitting: Internal Medicine

## 2014-06-09 ENCOUNTER — Ambulatory Visit (INDEPENDENT_AMBULATORY_CARE_PROVIDER_SITE_OTHER)
Admission: RE | Admit: 2014-06-09 | Discharge: 2014-06-09 | Disposition: A | Discharge status: Home / Self Care | Payer: Medicare Other | Source: Ambulatory Visit | Attending: Internal Medicine | Admitting: Internal Medicine

## 2014-06-09 ENCOUNTER — Other Ambulatory Visit (INDEPENDENT_AMBULATORY_CARE_PROVIDER_SITE_OTHER): Payer: Medicare Other

## 2014-06-09 VITALS — BP 148/84 | HR 64 | Temp 98.5°F | Wt 177.0 lb

## 2014-06-09 DIAGNOSIS — Z01818 Encounter for other preprocedural examination: Secondary | ICD-10-CM | POA: Diagnosis not present

## 2014-06-09 DIAGNOSIS — I10 Essential (primary) hypertension: Secondary | ICD-10-CM | POA: Diagnosis not present

## 2014-06-09 DIAGNOSIS — R259 Unspecified abnormal involuntary movements: Secondary | ICD-10-CM | POA: Diagnosis not present

## 2014-06-09 LAB — BASIC METABOLIC PANEL
BUN: 18 mg/dL (ref 6–23)
CALCIUM: 9.5 mg/dL (ref 8.4–10.5)
CO2: 28 mEq/L (ref 19–32)
CREATININE: 1.5 mg/dL — AB (ref 0.4–1.2)
Chloride: 99 mEq/L (ref 96–112)
GFR: 44.72 mL/min — ABNORMAL LOW (ref >60.00)
GLUCOSE: 95 mg/dL (ref 70–99)
Potassium: 4.2 mEq/L (ref 3.5–5.1)
Sodium: 136 mEq/L (ref 135–145)

## 2014-06-09 LAB — HEPATIC FUNCTION PANEL
ALBUMIN: 3.8 g/dL (ref 3.5–5.2)
ALT: 17 U/L (ref 0–35)
AST: 21 U/L (ref 0–37)
Alkaline Phosphatase: 74 U/L (ref 39–117)
BILIRUBIN DIRECT: 0 mg/dL (ref 0.0–0.3)
Total Bilirubin: 0.4 mg/dL (ref 0.2–1.2)
Total Protein: 7.6 g/dL (ref 6.0–8.3)

## 2014-06-09 LAB — CBC WITH DIFFERENTIAL/PLATELET
Basophils Absolute: 0 10*3/uL (ref 0.0–0.1)
Basophils Relative: 0.7 % (ref 0.0–3.0)
EOS ABS: 0.1 10*3/uL (ref 0.0–0.7)
Eosinophils Relative: 1.7 % (ref 0.0–5.0)
HCT: 41.2 % (ref 36.0–46.0)
Hemoglobin: 13.5 g/dL (ref 12.0–15.0)
LYMPHS PCT: 30.4 % (ref 12.0–46.0)
Lymphs Abs: 1.9 10*3/uL (ref 0.7–4.0)
MCHC: 32.8 g/dL (ref 30.0–36.0)
MCV: 87.4 fl (ref 78.0–100.0)
MONO ABS: 0.7 10*3/uL (ref 0.1–1.0)
Monocytes Relative: 10.4 % (ref 3.0–12.0)
NEUTROS PCT: 56.8 % (ref 43.0–77.0)
Neutro Abs: 3.6 10*3/uL (ref 1.4–7.7)
Platelets: 291 10*3/uL (ref 150.0–400.0)
RBC: 4.71 Mil/uL (ref 3.87–5.11)
RDW: 13.7 % (ref 11.5–15.5)
WBC: 6.4 10*3/uL (ref 4.0–10.5)

## 2014-06-09 LAB — URINALYSIS, ROUTINE W REFLEX MICROSCOPIC
Bilirubin Urine: NEGATIVE
Hgb urine dipstick: NEGATIVE
KETONES UR: NEGATIVE
Nitrite: NEGATIVE
PH: 7.5 (ref 5.0–8.0)
RBC / HPF: NONE SEEN (ref 0–?)
Specific Gravity, Urine: 1.01 (ref 1.000–1.030)
Total Protein, Urine: NEGATIVE
URINE GLUCOSE: NEGATIVE
UROBILINOGEN UA: 0.2 (ref 0.0–1.0)

## 2014-06-09 LAB — PROTIME-INR
INR: 1 ratio (ref 0.8–1.0)
Prothrombin Time: 11.2 s (ref 9.6–13.1)

## 2014-06-09 LAB — APTT: aPTT: 31.2 s (ref 23.4–32.7)

## 2014-06-09 LAB — TSH: TSH: 1.84 u[IU]/mL (ref 0.35–4.50)

## 2014-06-09 NOTE — Assessment & Plan Note (Addendum)
  The pt is clear for surgery. Her labs, EKG and CXR are acceptable. Thank you!

## 2014-06-09 NOTE — Assessment & Plan Note (Signed)
Doing well on Rx.

## 2014-06-09 NOTE — Progress Notes (Signed)
Subjective:    HPI  IM consult Req by Dr Toney Rakes Reason: pre-op clearance for hysterectomy for LOW GRADE SQUAMOUS INTRAEPITHELIAL LESION: CIN-1/ HPV (LSIL). The patient presents for a follow-up of  chronic hypertension, chronic dyslipidemia, tic - all stable   Review of Systems  Constitutional: Negative for chills, activity change, appetite change, fatigue and unexpected weight change.  HENT: Negative for congestion, mouth sores and sinus pressure.   Eyes: Negative for visual disturbance.  Respiratory: Negative for cough and chest tightness.   Gastrointestinal: Negative for nausea and abdominal pain.  Genitourinary: Negative for frequency, difficulty urinating and vaginal pain.  Musculoskeletal: Negative for back pain and gait problem.  Skin: Negative for pallor and rash.  Neurological: Negative for dizziness, tremors, weakness, numbness and headaches.  Psychiatric/Behavioral: Negative for confusion and sleep disturbance.   BP Readings from Last 3 Encounters:  06/09/14 148/84  05/14/14 138/82  04/16/14 146/84   Wt Readings from Last 3 Encounters:  06/09/14 177 lb (80.287 kg)  04/16/14 171 lb (77.565 kg)  03/19/14 177 lb 12 oz (80.627 kg)   Past Medical History  Diagnosis Date  . Hypertension   . Tics of organic origin     Dr. Doy Mince  . Hyperlipidemia   . CIN III (cervical intraepithelial neoplasia grade III) with severe dysplasia    Past Surgical History  Procedure Laterality Date  . Eye surgery      left eye  . Dilation and curettage of uterus    . Cervical conization w/bx N/A 03/12/2013    Procedure: COLD KNIFE CONIZATION CERVIX WITH BIOPSY/EXAM UNDER ANESTHESIA, RECTAL EXAM;  Surgeon: Alvino Chapel, MD;  Location: [facility];  Service: Gynecology;  Laterality: N/A;  . Cervical conization w/bx N/A 03/12/2013    Procedure: CONIZATION CERVIX WITH BIOPSY;  Surgeon: Alvino Chapel, MD;  Location: [facility];  Service:  Gynecology;  Laterality: N/A;  patient returned to OR for fulgeration to stop bleeding    reports that she has never smoked. She has never used smokeless tobacco. She reports that she does not drink alcohol or use illicit drugs. family history includes Diabetes in her father; Heart disease in her maternal aunt; Hypertension in her father, mother, and other. Allergies  Allergen Reactions  . Ace Inhibitors   . Doxazosin Mesylate   . Verapamil     REACTION: legs swelling, tired   Current Outpatient Prescriptions on File Prior to Visit  Medication Sig Dispense Refill  . atenolol-chlorthalidone (TENORETIC) 100-25 MG per tablet     . atenolol-chlorthalidone (TENORETIC) 100-25 MG per tablet take 1 tablet by mouth every morning 90 tablet 3  . cholecalciferol (VITAMIN D) 1000 UNITS tablet Take 1 tablet (1,000 Units total) by mouth daily. 100 tablet 3  . ciprofloxacin (CIPRO) 500 MG tablet Take 1 tablet (500 mg total) by mouth 2 (two) times daily. 14 tablet 1  . losartan (COZAAR) 100 MG tablet take 1 tablet by mouth once daily 90 tablet 3  . trihexyphenidyl (ARTANE) 2 MG tablet Take 1.5 tablets (3 mg total) by mouth 3 (three) times daily. 405 tablet 3   No current facility-administered medications on file prior to visit.   BP 148/84 mmHg  Pulse 64  Temp(Src) 98.5 F (36.9 C) (Oral)  Wt 177 lb (80.287 kg)  SpO2 97%       Objective:   Physical Exam  Constitutional: She appears well-developed. No distress.  HENT:  Head: Normocephalic.  Right Ear: External ear normal.  Left  Ear: External ear normal.  Nose: Nose normal.  Mouth/Throat: Oropharynx is clear and moist.  Eyes: Conjunctivae are normal. Pupils are equal, round, and reactive to light. Right eye exhibits no discharge. Left eye exhibits no discharge.  Neck: Normal range of motion. Neck supple. No JVD present. No tracheal deviation present. No thyromegaly present.  Cardiovascular: Normal rate, regular rhythm and normal heart sounds.    Pulmonary/Chest: No stridor. No respiratory distress. She has no wheezes.  Abdominal: Soft. Bowel sounds are normal. She exhibits no distension and no mass. There is no tenderness. There is no rebound and no guarding.  Musculoskeletal: She exhibits no edema or tenderness.  Lymphadenopathy:    She has no cervical adenopathy.  Neurological: She displays normal reflexes. No cranial nerve deficit. She exhibits normal muscle tone. Coordination normal.  Facial tic is present  Skin: No rash noted. No erythema.  Psychiatric: She has a normal mood and affect. Her behavior is normal. Judgment and thought content normal.     Lab Results  Component Value Date   WBC 5.9 03/19/2014   HGB 13.5 03/19/2014   HCT 40.2 03/19/2014   PLT 333.0 03/19/2014   GLUCOSE 89 03/19/2014   CHOL 243* 04/30/2013   TRIG 91.0 04/30/2013   HDL 62.40 04/30/2013   LDLDIRECT 155.6 04/30/2013   ALT 19 03/19/2014   AST 26 03/19/2014   NA 139 03/19/2014   K 3.1* 03/19/2014   CL 103 03/19/2014   CREATININE 1.2 03/19/2014   BUN 15 03/19/2014   CO2 28 03/19/2014   TSH 3.58 04/30/2013    EKG    Assessment & Plan:

## 2014-06-09 NOTE — Progress Notes (Signed)
Pre visit review using our clinic review tool, if applicable. No additional management support is needed unless otherwise documented below in the visit note. 

## 2014-06-09 NOTE — Assessment & Plan Note (Signed)
BP Readings from Last 3 Encounters:  06/09/14 148/84  05/14/14 138/82  04/16/14 146/84   Cont Rx. BP is nl at home per pt Labs

## 2014-06-10 ENCOUNTER — Other Ambulatory Visit: Payer: Self-pay | Admitting: Internal Medicine

## 2014-06-10 MED ORDER — CIPROFLOXACIN HCL 250 MG PO TABS: 250.0000 mg | ORAL_TABLET | Freq: Two times a day (BID) | ORAL | Status: DC

## 2014-06-17 ENCOUNTER — Other Ambulatory Visit: Payer: Self-pay | Admitting: Gynecology

## 2014-06-17 ENCOUNTER — Telehealth: Payer: Self-pay

## 2014-06-17 MED ORDER — ESTRADIOL 0.1 MG/GM VA CREA: TOPICAL_CREAM | VAGINAL | Status: DC

## 2014-06-17 NOTE — Telephone Encounter (Signed)
Per staff message from Dr. Moshe Salisbury "Please call in prescription for this patient for Estrace vaginal cream which I would like her to place intravaginally daily at bedtime for one week and then 3 times a week until her surgery."  I called patient and explained need for this to her and reviewed instruction. Rx sent.

## 2014-06-19 ENCOUNTER — Telehealth: Payer: Self-pay | Admitting: *Deleted

## 2014-06-19 NOTE — Telephone Encounter (Signed)
Prior authorization for estrace 0.01 % cream faxed to medicare part d, will wait of response.

## 2014-06-23 MED ORDER — ESTROGENS, CONJUGATED 0.625 MG/GM VA CREA: TOPICAL_CREAM | VAGINAL | Status: DC

## 2014-06-23 NOTE — Telephone Encounter (Signed)
Dr. Toney Rakes estrace 0.01% cream was denied by insurance not covered, the preferred drug is premarin per staff message "Please call in prescription for this patient for Estrace vaginal cream which I would like her to place intravaginally daily at bedtime for one week and then 3 times a week until her surgery."  Can pt switch to premarin?

## 2014-06-23 NOTE — Telephone Encounter (Signed)
Rx sent to pharmacy   

## 2014-06-23 NOTE — Telephone Encounter (Signed)
If her insurance will cover Premarin cream and not expensive for her that will be fine. Use twice a week and refills for 6 months

## 2014-07-15 ENCOUNTER — Encounter: Payer: Self-pay | Admitting: Gynecology

## 2014-07-15 ENCOUNTER — Inpatient Hospital Stay (HOSPITAL_COMMUNITY): Admission: RE | Admit: 2014-07-15 | Payer: Medicare Other | Source: Ambulatory Visit

## 2014-07-15 ENCOUNTER — Ambulatory Visit (INDEPENDENT_AMBULATORY_CARE_PROVIDER_SITE_OTHER): Payer: Medicare Other | Admitting: Gynecology

## 2014-07-15 VITALS — BP 138/90

## 2014-07-15 DIAGNOSIS — Z01818 Encounter for other preprocedural examination: Secondary | ICD-10-CM

## 2014-07-15 DIAGNOSIS — D069 Carcinoma in situ of cervix, unspecified: Secondary | ICD-10-CM

## 2014-07-15 NOTE — Patient Instructions (Signed)
Abdominal Hysterectomy Abdominal hysterectomy is a surgical procedure to remove your womb (uterus). Your uterus is the muscular organ that contains a developing baby. This surgery is done for many reasons. You may need an abdominal hysterectomy if you have cancer, growths (tumors), long-term pain, or bleeding. You may also have this procedure if your uterus has slipped down into your vagina (uterine prolapse). Depending on why you need an abdominal hysterectomy, you may also have other reproductive organs removed. These could include the part of your vagina that connects with your uterus (cervix), the organs that make eggs (ovaries), and the tubes that connect the ovaries to the uterus (fallopian tubes). LET [facility] CARE PROVIDER KNOW ABOUT:   Any allergies you have.  All medicines you are taking, including vitamins, herbs, eye drops, creams, and over-the-counter medicines.  Previous problems you or members of your family have had with the use of anesthetics.  Any blood disorders you have.  Previous surgeries you have had.  Medical conditions you have. RISKS AND COMPLICATIONS Generally, this is a safe procedure. However, as with any procedure, problems can occur. Infection is the most common problem after an abdominal hysterectomy. Other possible problems include:  Bleeding.  Formation of blood clots that may break free and travel to your lungs.  Injury to other organs near your uterus.  Nerve injury causing nerve pain.  Decreased interest in sex or pain during sexual intercourse. BEFORE THE PROCEDURE  Abdominal hysterectomy is a major surgical procedure. It can affect the way you feel about yourself. Talk to your health care provider about the physical and emotional changes hysterectomy may cause.  You may need to have blood work and X-rays done before surgery.  Quit smoking if you smoke. Ask your health care provider for help if you are struggling to quit.  Stop taking  medicines that thin your blood as directed by your health care provider.  You may be instructed to take antibiotic medicines or laxatives before surgery.  Do not eat or drink anything for 6-8 hours before surgery.  Take your regular medicines with a small sip of water.  Bathe or shower the night or morning before surgery. PROCEDURE  Abdominal hysterectomy is done in the operating room at the hospital.  In most cases, you will be given a medicine that makes you go to sleep (general anesthetic).  The surgeon will make a cut (incision) through the skin in your lower belly.  The incision may be about 5-7 inches long. It may go side-to-side or up-and-down.  The surgeon will move aside the body tissue that covers your uterus. The surgeon will then carefully take out your uterus along with any of your other reproductive organs that need to be removed.  Bleeding will be controlled with clamps or sutures.  The surgeon will close your incision with sutures or metal clips. AFTER THE PROCEDURE  You will have some pain immediately after the procedure.  You will be given pain medicine in the recovery room.  You will be taken to your hospital room when you have recovered from the anesthesia.  You may need to stay in the hospital for 2-5 days.  You will be given instructions for recovery at home. Document Released: 07/02/2013 Document Reviewed: 07/02/2013 [facility] Patient Information 2015 [place], [place]. This information is not intended to replace advice given to you by your health care provider. Make sure you discuss any questions you have with your health care provider.

## 2014-07-15 NOTE — Progress Notes (Signed)
Laurie Pratt is an 74 y.o. female. Jehovah's Witness who presented to the office today for preoperative consultation examination. Her history is as follows:  74 year old patient presented to the office for colonoscopy as a result of her recent abnormal Pap smear. Her dysplasia history and GYN oncology consultation as follows:  Patient was seen the office on August 7 as a new patient and had beencomplaining at that time a postcoital bleeding when she became sexually active. She had not had a gynecological exam or Pap smear 4 years prior to that. She had reported normal Pap smears prior to that incident. Her Pap smear demonstrated the following:  Diagnosis  SQUAMOUS CELL CARCINOMA.  Patient was then referred to GYN oncologist Dr. Charlaine Dalton. He subsequently did cervical biopsies and eventually took the patient to the operating room September 2 and did a cold knife cervical conization in 2014 with pathology report as follows:  Diagnosis  Cervix, cone  - LOW TO HIGH GRADE SQUAMOUS INTRAEPITHELIAL LESION, CIN I-III (MILD TO SEVERE  DYSPLASIA/CIS) WITH ENDOCERVICAL GLANDULAR EXTENSION, SEE COMMENT.  - HIGH GRADE DYSPLASIA PRESENT AT ECTOCERVICAL MARGIN.  - ENDOCERVICAL MARGIN, NEGATIVE FOR DYSPLASIA.  Microscopic Comment  There is extensive dysplasia present demonstrating a range of grade from mild to severe squamous  dysplasia/in situ carcinoma. The ectocervical margin is involved by high grade dysplasia at the 3 to 6 o'clock  and 9 to 12 o'clock aspect of the tissue. The findings correlate with the previous Pap test results  The patient was last seen by him of October 2014 and he had recommended abdominal hysterectomy with bilateral salpingo-oophorectomy but patient refused at the present time.   Her Pap smear October October 7 demonstrated the following: Diagnosis LOW GRADE SQUAMOUS INTRAEPITHELIAL LESION: CIN-1/ HPV (LSIL). RECOMMENDATION THERE ARE A FEW CELLS  SUGGESTIVE OF A HIGHER GRADE LESION. CLINICAL CORRELATION IS RECOMMENDED.  On 05/14/2014 patient underwent a detail colposcopic evaluation with pathology report as follows:  Diagnosis 1. Cervix, biopsy, 12 o'clock - HYPER AND PARAKERATOTIC SQUAMOUS MUCOSA WITH LOW GRADE SQUAMOUS INTRAEPITHELIAL LESION, CIN-I (MILD DYSPLASIA). - TRANSFORMATION ZONE MUCOSA IS NOT PRESENT. 2. Cervix, biopsy, 9 o'clock - HYPER AND PARAKERATOTIC SQUAMOUS MUCOSA WITH LOW GRADE SQUAMOUS INTRAEPITHELIAL LESION, CIN-I (MILD DYSPLASIA). - TRANSFORMATION ZONE MUCOSA IS NOT PRESENT. 3. Cervix, biopsy, 3 o'clock - SQUAMOUS MUCOSA WITH LOW GRADE SQUAMOUS INTRAEPITHELIAL LESION, CIN-I (MILD DYSPLASIA). - TRANSFORMATION ZONE MUCOSA IS NOT PRESENT.  In October of this past year patient had an ultrasound because she was feeling bloated and the ultrasound demonstrated the following:  Uterus measured 5.5 x 4.2 x 3.5 cm with endometrial stripe of 2.6 mm. Patient had one isolated small posterior fibroid measured 17 x 18 x 16 mm. Right ovary was atrophic left ovary not seen. Endometrial stripe was 2.6 mm. Excess of bowel shadowing was noted in the left adnexa. No fluid in the cul-de-sac.  Case had been discussed with her GYN oncologist Dr. Charlaine Dalton who had recommended to proceed with total abdominal hysterectomy with bilateral salpingo-oophorectomy as a result of her narrow vagina and friable cervix.  Patient had preoperative consultation for medical clearance with her PCP Dr. Dorann Lodge cough who cleared her for surgery. See entry dated 06/09/2014 in Epic for further details.   Pertinent Gynecological History: Menses: post-menopausal Bleeding: none Contraception: post menopausal status DES exposure: denies Blood transfusions: none Sexually transmitted diseases: no past history Previous GYN Procedures: Cone  BiopsyNSVD X 2  Last mammogram: normal Date: 2014 Last pap:  abnormal: See above Date: 2015 OB  History: G2, P2   Menstrual History: Menarche age: 85  No LMP recorded. Patient is postmenopausal.    Past Medical History  Diagnosis Date  . Hypertension   . Tics of organic origin     Dr. Doy Mince  . Hyperlipidemia   . CIN III (cervical intraepithelial neoplasia grade III) with severe dysplasia     Past Surgical History  Procedure Laterality Date  . Eye surgery      left eye  . Dilation and curettage of uterus    . Cervical conization w/bx N/A 03/12/2013    Procedure: COLD KNIFE CONIZATION CERVIX WITH BIOPSY/EXAM UNDER ANESTHESIA, RECTAL EXAM;  Surgeon: Alvino Chapel, MD;  Location: [facility];  Service: Gynecology;  Laterality: N/A;  . Cervical conization w/bx N/A 03/12/2013    Procedure: CONIZATION CERVIX WITH BIOPSY;  Surgeon: Alvino Chapel, MD;  Location: [facility];  Service: Gynecology;  Laterality: N/A;  patient returned to OR for fulgeration to stop bleeding    Family History  Problem Relation Age of Onset  . Hypertension Other   . Hypertension Mother   . Diabetes Father   . Hypertension Father   . Heart disease Maternal Aunt     CHF    Social History:  reports that she has never smoked. She has never used smokeless tobacco. She reports that she does not drink alcohol or use illicit drugs.  Allergies:  Allergies  Allergen Reactions  . Ace Inhibitors   . Doxazosin Mesylate   . Verapamil     REACTION: legs swelling, tired     (Not in a hospital admission)  REVIEW OF SYSTEMS: A ROS was performed and pertinent positives and negatives are included in the history.  GENERAL: No fevers or chills. HEENT: No change in vision, no earache, sore throat or sinus congestion. NECK: No pain or stiffness. CARDIOVASCULAR: No chest pain or pressure. No palpitations. PULMONARY: No shortness of breath, cough or wheeze. GASTROINTESTINAL: No abdominal pain, nausea, vomiting or diarrhea, melena or bright red blood per rectum.  GENITOURINARY: No urinary frequency, urgency, hesitancy or dysuria. MUSCULOSKELETAL: No joint or muscle pain, no back pain, no recent trauma. DERMATOLOGIC: No rash, no itching, no lesions. ENDOCRINE: No polyuria, polydipsia, no heat or cold intolerance. No recent change in weight. HEMATOLOGICAL: No anemia or easy bruising or bleeding. NEUROLOGIC: No headache, seizures, numbness, tingling or weakness. PSYCHIATRIC: No depression, no loss of interest in normal activity or change in sleep pattern.     Blood pressure 138/90.  Physical Exam:  HEENT:unremarkable Neck:Supple, midline, no thyroid megaly, no carotid bruits Lungs:  Clear to auscultation no rhonchi's or wheezes Heart:Regular rate and rhythm, no murmurs or gallops Breast Exam: Both breasts are symmetrical in appearance no palpable masses or tenderness no supra clavicular axillary lymphadenopathy Abdomen: Soft nontender no rebound or guarding Pelvic:BUS atrophic changes Vagina: Atrophic changes Cervix: Flat atrophic changes was friable patient currently on estrogen vaginal cream Uterus: Anteverted normal size shape and consistency Adnexa: No palpable masses or tenderness Extremities: No cords, no edema Rectal: Unremarkable   Assessment/Plan:Patient with cervical cone biopsy 2014 indicating CIN-1-CIN-3 with endocervical glandular extension high-grade dysplasia present at the ectocervical margin. Endocervical margin was reportedly negative for dysplasia. Follow-up Pap smear this year with low-grade dysplasia suspicious of higher grade lesion colposcopic evaluation and biopsy 05/14/2014 indicated all areas biopsied had demonstrated CIN-1. Case had been discussed with the GYN oncologist Dr. Quillian Quince o'clock Pearson had recommended proceeding  with a total abdominal hysterectomy with bilateral salpingo-oophorectomy. Because of flat cervix from past cervical conization friability she had been placed on Premarin vaginal cream twice a week which she  will continue until the end of this week. 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 She will also get together with the anesthesiologist before surgery as well. Patient was cleared for surgery by Dr. Alain Marion. Risk associated with surgery and discuss with patient as follows:                        Patient was counseled as to the risk of surgery to include the following:  1. Infection (prohylactic antibiotics will be administered)  2. DVT/Pulmonary Embolism (prophylactic pneumo compression stockings will be used)  3.Trauma to internal organs requiring additional surgical procedure to repair any injury to     Internal organs requiring perhaps additional hospitalization days.  4.Hemmorhage requiring transfusion and blood products which carry risks such as             anaphylactic reaction, hepatitis and AIDS  Patient had received literature information on the procedure scheduled and all her questions were answered and fully accepts all risk.   [facility] HMD10:00 AMTD     Terrance Mass 07/15/2014, 9:33 AM  Note: This dictation was prepared with  Dragon/digital dictation along withSmart phrase technology. Any transcriptional errors that result from this process are unintentional.

## 2014-07-17 NOTE — Patient Instructions (Addendum)
   Your procedure is scheduled on: Tuesday, Jan 12  Enter through the [employer] [facility] at: 6 AM Pick up the phone at the desk and dial [phone] and inform us of your arrival.  Please call this number if you have any problems the morning of surgery: [phone]  Remember: Do not eat or drink after midnight: Monday Take these medicines the morning of surgery with a SIP OF WATER: atenolol, losartan, trihexyphenidyl   Do not wear jewelry, make-up, or FINGER nail polish No metal in your hair or on your body. Do not wear lotions, powders, perfumes.  You may wear deodorant.  Do not bring valuables to the hospital. Contacts, dentures or bridgework may not be worn into surgery.  Leave suitcase in the car. After Surgery it may be brought to your room. For patients being admitted to the hospital, checkout time is 11:00am the day of discharge.  Home with daughter Larene [place] cell [phone]

## 2014-07-18 ENCOUNTER — Other Ambulatory Visit: Payer: Self-pay | Admitting: Gynecology

## 2014-07-18 ENCOUNTER — Telehealth: Payer: Self-pay

## 2014-07-18 ENCOUNTER — Encounter (HOSPITAL_COMMUNITY): Payer: Self-pay

## 2014-07-18 ENCOUNTER — Encounter (HOSPITAL_COMMUNITY)
Admission: RE | Admit: 2014-07-18 | Discharge: 2014-07-18 | Disposition: A | Discharge status: Home / Self Care | Payer: Medicare Other | Source: Ambulatory Visit | Attending: Gynecology | Admitting: Gynecology

## 2014-07-18 DIAGNOSIS — Z01812 Encounter for preprocedural laboratory examination: Secondary | ICD-10-CM | POA: Diagnosis not present

## 2014-07-18 DIAGNOSIS — D069 Carcinoma in situ of cervix, unspecified: Secondary | ICD-10-CM | POA: Diagnosis not present

## 2014-07-18 DIAGNOSIS — Z79899 Other long term (current) drug therapy: Secondary | ICD-10-CM | POA: Diagnosis not present

## 2014-07-18 DIAGNOSIS — I10 Essential (primary) hypertension: Secondary | ICD-10-CM | POA: Insufficient documentation

## 2014-07-18 HISTORY — DX: Myoneural disorder, unspecified: G70.9

## 2014-07-18 LAB — URINALYSIS, ROUTINE W REFLEX MICROSCOPIC
BILIRUBIN URINE: NEGATIVE
Glucose, UA: NEGATIVE mg/dL
Hgb urine dipstick: NEGATIVE
Ketones, ur: NEGATIVE mg/dL
LEUKOCYTES UA: NEGATIVE
Nitrite: NEGATIVE
Protein, ur: NEGATIVE mg/dL
Specific Gravity, Urine: 1.015 (ref 1.005–1.030)
UROBILINOGEN UA: 0.2 mg/dL (ref 0.0–1.0)
pH: 7.5 (ref 5.0–8.0)

## 2014-07-18 LAB — BASIC METABOLIC PANEL
ANION GAP: 8 (ref 5–15)
BUN: 12 mg/dL (ref 6–23)
CALCIUM: 9.5 mg/dL (ref 8.4–10.5)
CO2: 29 mmol/L (ref 19–32)
Chloride: 101 mEq/L (ref 96–112)
Creatinine, Ser: 1.13 mg/dL — ABNORMAL HIGH (ref 0.50–1.10)
GFR calc non Af Amer: 48 mL/min — ABNORMAL LOW (ref >90)
GFR, EST AFRICAN AMERICAN: 55 mL/min — AB (ref >90)
Glucose, Bld: 102 mg/dL — ABNORMAL HIGH (ref 70–99)
Potassium: 3 mmol/L — ABNORMAL LOW (ref 3.5–5.1)
SODIUM: 138 mmol/L (ref 135–145)

## 2014-07-18 LAB — CBC
HCT: 38.8 % (ref 36.0–46.0)
HEMOGLOBIN: 13.2 g/dL (ref 12.0–15.0)
MCH: 29.2 pg (ref 26.0–34.0)
MCHC: 34 g/dL (ref 30.0–36.0)
MCV: 85.8 fL (ref 78.0–100.0)
PLATELETS: 280 10*3/uL (ref 150–400)
RBC: 4.52 MIL/uL (ref 3.87–5.11)
RDW: 14 % (ref 11.5–15.5)
WBC: 5.3 10*3/uL (ref 4.0–10.5)

## 2014-07-18 MED ORDER — POTASSIUM CHLORIDE CRYS ER 20 MEQ PO TBCR: 20.0000 meq | EXTENDED_RELEASE_TABLET | Freq: Two times a day (BID) | ORAL | Status: DC

## 2014-07-18 NOTE — Telephone Encounter (Signed)
Very good thank you ?

## 2014-07-18 NOTE — Telephone Encounter (Signed)
I confirmed with patient that currently she is not taking any Potassium. Dr. Glennon Mac, anesthesiologist, wants her to take BID Potassium.  Patient was instructed to take bid and encouraged to eat Potassium rich foods and I read her a list of examples.

## 2014-07-18 NOTE — Telephone Encounter (Signed)
Ileene Hutchinson, RN called to inform Dr. Moshe Salisbury that patient's Potassium was low.  She stated patient is on Kdur 20 bid but I feel certain she is seeing this in her meds where I sent the Rx this morning.  I just realized that the standard sig populated and I should have corrected it to one daily.  I will go back in and do that and let pharmacy know.  However, Dr. Glennon Mac said he wanted to have her take 2 additional on the 2 he assumed she was taking daily. Much confusion so I called Ileene Hutchinson back and had to leave a message for her to call me so we can clarify it all and I can add corrected instructions to Rx.  I had left message for patient to call me and I am curious if Ileene Hutchinson has spoke with patient as well.

## 2014-07-18 NOTE — Telephone Encounter (Signed)
Rx for Potassium was sent to pharmacy.

## 2014-07-18 NOTE — Pre-Procedure Instructions (Addendum)
Informed Dr Lyndle Herrlich via phone that patient's potassium at PAT visit today was 3.0 (low).  Patient is currently taking K-Dur 20 mEq BID.  Telephone order to instructed patient to take 2 more pills per day until day of surgery to help increase potassium level.  Verified telephone order.  Patient called [phone] and verbalized understanding.  Juliann Pulse at Dr. Toney Rakes also informed.

## 2014-07-21 MED ORDER — DEXTROSE 5 % IV SOLN
2.0000 g | INTRAVENOUS | Status: AC
  Administered 2014-07-22: 2 g via INTRAVENOUS
  Filled 2014-07-21: qty 2

## 2014-07-22 ENCOUNTER — Inpatient Hospital Stay (HOSPITAL_COMMUNITY): Payer: Medicare Other | Admitting: Anesthesiology

## 2014-07-22 ENCOUNTER — Encounter (HOSPITAL_COMMUNITY): Payer: Self-pay | Admitting: Anesthesiology

## 2014-07-22 ENCOUNTER — Inpatient Hospital Stay (HOSPITAL_COMMUNITY)
Admission: RE | Admit: 2014-07-22 | Discharge: 2014-07-24 | DRG: 741 | Disposition: A | Discharge status: Home / Self Care | Payer: Medicare Other | Source: Ambulatory Visit | Attending: Gynecology | Admitting: Gynecology

## 2014-07-22 ENCOUNTER — Encounter (HOSPITAL_COMMUNITY)
Admission: RE | Disposition: A | Discharge status: Home / Self Care | Payer: Self-pay | Source: Ambulatory Visit | Attending: Gynecology

## 2014-07-22 DIAGNOSIS — N8331 Acquired atrophy of ovary: Secondary | ICD-10-CM | POA: Diagnosis present

## 2014-07-22 DIAGNOSIS — Z888 Allergy status to other drugs, medicaments and biological substances status: Secondary | ICD-10-CM

## 2014-07-22 DIAGNOSIS — D060 Carcinoma in situ of endocervix: Principal | ICD-10-CM | POA: Diagnosis present

## 2014-07-22 DIAGNOSIS — N736 Female pelvic peritoneal adhesions (postinfective): Secondary | ICD-10-CM | POA: Diagnosis present

## 2014-07-22 DIAGNOSIS — R8569 Abnormal cytological findings in specimens from other digestive organs and abdominal cavity: Secondary | ICD-10-CM | POA: Diagnosis not present

## 2014-07-22 DIAGNOSIS — E785 Hyperlipidemia, unspecified: Secondary | ICD-10-CM | POA: Diagnosis present

## 2014-07-22 DIAGNOSIS — I10 Essential (primary) hypertension: Secondary | ICD-10-CM | POA: Diagnosis present

## 2014-07-22 DIAGNOSIS — N870 Mild cervical dysplasia: Secondary | ICD-10-CM | POA: Diagnosis present

## 2014-07-22 DIAGNOSIS — E876 Hypokalemia: Secondary | ICD-10-CM | POA: Diagnosis not present

## 2014-07-22 DIAGNOSIS — D069 Carcinoma in situ of cervix, unspecified: Secondary | ICD-10-CM

## 2014-07-22 DIAGNOSIS — N871 Moderate cervical dysplasia: Secondary | ICD-10-CM | POA: Diagnosis not present

## 2014-07-22 DIAGNOSIS — Z9889 Other specified postprocedural states: Secondary | ICD-10-CM

## 2014-07-22 DIAGNOSIS — D259 Leiomyoma of uterus, unspecified: Secondary | ICD-10-CM | POA: Diagnosis not present

## 2014-07-22 HISTORY — PX: SALPINGOOPHORECTOMY: SHX82

## 2014-07-22 HISTORY — PX: ABDOMINAL HYSTERECTOMY: SHX81

## 2014-07-22 LAB — POTASSIUM: Potassium: 4.2 mmol/L (ref 3.5–5.1)

## 2014-07-22 SURGERY — HYSTERECTOMY, ABDOMINAL
Anesthesia: General | Site: Abdomen

## 2014-07-22 MED ORDER — BUPIVACAINE LIPOSOME 1.3 % IJ SUSP
20.0000 mL | Freq: Once | INTRAMUSCULAR | Status: AC
  Administered 2014-07-22: 20 mL
  Filled 2014-07-22: qty 20

## 2014-07-22 MED ORDER — ONDANSETRON HCL 4 MG/2ML IJ SOLN
INTRAMUSCULAR | Status: DC | PRN
  Administered 2014-07-22: 4 mg via INTRAVENOUS

## 2014-07-22 MED ORDER — MEPERIDINE HCL 25 MG/ML IJ SOLN: 6.2500 mg | INTRAMUSCULAR | Status: DC | PRN

## 2014-07-22 MED ORDER — SODIUM CHLORIDE 0.9 % IJ SOLN: 9.0000 mL | INTRAMUSCULAR | Status: DC | PRN

## 2014-07-22 MED ORDER — SODIUM CHLORIDE 0.9 % IJ SOLN
INTRAMUSCULAR | Status: AC
  Filled 2014-07-22: qty 10

## 2014-07-22 MED ORDER — DEXAMETHASONE SODIUM PHOSPHATE 4 MG/ML IJ SOLN
INTRAMUSCULAR | Status: AC
  Filled 2014-07-22: qty 1

## 2014-07-22 MED ORDER — HYDROMORPHONE HCL 1 MG/ML IJ SOLN
INTRAMUSCULAR | Status: AC
  Filled 2014-07-22: qty 1

## 2014-07-22 MED ORDER — LACTATED RINGERS IV SOLN
INTRAVENOUS | Status: DC
  Administered 2014-07-22 – 2014-07-23 (×3): via INTRAVENOUS

## 2014-07-22 MED ORDER — SODIUM CHLORIDE 0.9 % IJ SOLN
INTRAMUSCULAR | Status: AC
  Filled 2014-07-22: qty 150

## 2014-07-22 MED ORDER — LACTATED RINGERS IV SOLN
INTRAVENOUS | Status: DC
  Administered 2014-07-22 (×2): via INTRAVENOUS

## 2014-07-22 MED ORDER — PROPOFOL 10 MG/ML IV BOLUS
INTRAVENOUS | Status: AC
  Filled 2014-07-22: qty 20

## 2014-07-22 MED ORDER — PROPOFOL 10 MG/ML IV BOLUS
INTRAVENOUS | Status: DC | PRN
  Administered 2014-07-22: 100 mg via INTRAVENOUS

## 2014-07-22 MED ORDER — HYDROMORPHONE HCL 1 MG/ML IJ SOLN
0.2500 mg | INTRAMUSCULAR | Status: DC | PRN
  Administered 2014-07-22 (×3): 0.5 mg via INTRAVENOUS

## 2014-07-22 MED ORDER — DEXAMETHASONE SODIUM PHOSPHATE 10 MG/ML IJ SOLN
INTRAMUSCULAR | Status: DC | PRN
  Administered 2014-07-22: 4 mg via INTRAVENOUS

## 2014-07-22 MED ORDER — NEOSTIGMINE METHYLSULFATE 10 MG/10ML IV SOLN
INTRAVENOUS | Status: DC | PRN
  Administered 2014-07-22: 4 mg via INTRAVENOUS

## 2014-07-22 MED ORDER — NEOSTIGMINE METHYLSULFATE 10 MG/10ML IV SOLN
INTRAVENOUS | Status: AC
  Filled 2014-07-22: qty 1

## 2014-07-22 MED ORDER — TRIHEXYPHENIDYL HCL 2 MG PO TABS
3.0000 mg | ORAL_TABLET | Freq: Three times a day (TID) | ORAL | Status: DC
  Administered 2014-07-22 – 2014-07-24 (×7): 3 mg via ORAL
  Filled 2014-07-22 (×10): qty 2

## 2014-07-22 MED ORDER — ROCURONIUM BROMIDE 100 MG/10ML IV SOLN
INTRAVENOUS | Status: AC
  Filled 2014-07-22: qty 1

## 2014-07-22 MED ORDER — NALOXONE HCL 0.4 MG/ML IJ SOLN: 0.4000 mg | INTRAMUSCULAR | Status: DC | PRN

## 2014-07-22 MED ORDER — FENTANYL CITRATE 0.05 MG/ML IJ SOLN
INTRAMUSCULAR | Status: AC
  Filled 2014-07-22: qty 5

## 2014-07-22 MED ORDER — EPHEDRINE SULFATE 50 MG/ML IJ SOLN
INTRAMUSCULAR | Status: DC | PRN
  Administered 2014-07-22: 5 mg via INTRAVENOUS

## 2014-07-22 MED ORDER — DIPHENHYDRAMINE HCL 12.5 MG/5ML PO ELIX: 12.5000 mg | ORAL_SOLUTION | Freq: Four times a day (QID) | ORAL | Status: DC | PRN

## 2014-07-22 MED ORDER — FENTANYL 10 MCG/ML IV SOLN
INTRAVENOUS | Status: DC
Start: 2014-07-22 — End: 2014-07-23
  Administered 2014-07-22: 70 ug via INTRAVENOUS
  Administered 2014-07-22: 13:00:00 via INTRAVENOUS
  Administered 2014-07-22: 40 ug via INTRAVENOUS
  Administered 2014-07-23: 140 ug via INTRAVENOUS
  Filled 2014-07-22: qty 50

## 2014-07-22 MED ORDER — ATENOLOL-CHLORTHALIDONE 100-25 MG PO TABS: 1.0000 | ORAL_TABLET | Freq: Every morning | ORAL | Status: DC

## 2014-07-22 MED ORDER — CHLORTHALIDONE 25 MG PO TABS
25.0000 mg | ORAL_TABLET | Freq: Every day | ORAL | Status: DC
  Administered 2014-07-23 – 2014-07-24 (×2): 25 mg via ORAL
  Filled 2014-07-22 (×3): qty 1

## 2014-07-22 MED ORDER — EPHEDRINE 5 MG/ML INJ
INTRAVENOUS | Status: AC
  Filled 2014-07-22: qty 10

## 2014-07-22 MED ORDER — LOSARTAN POTASSIUM 50 MG PO TABS
100.0000 mg | ORAL_TABLET | Freq: Every day | ORAL | Status: DC
  Administered 2014-07-23 – 2014-07-24 (×2): 100 mg via ORAL
  Filled 2014-07-22 (×3): qty 2

## 2014-07-22 MED ORDER — PROMETHAZINE HCL 25 MG/ML IJ SOLN
6.2500 mg | INTRAMUSCULAR | Status: DC | PRN
Start: 2014-07-22 — End: 2014-07-22

## 2014-07-22 MED ORDER — OXYCODONE-ACETAMINOPHEN 5-325 MG PO TABS
1.0000 | ORAL_TABLET | Freq: Four times a day (QID) | ORAL | Status: DC | PRN
  Administered 2014-07-23 – 2014-07-24 (×4): 2 via ORAL
  Filled 2014-07-22 (×4): qty 2

## 2014-07-22 MED ORDER — DIPHENHYDRAMINE HCL 50 MG/ML IJ SOLN
12.5000 mg | Freq: Four times a day (QID) | INTRAMUSCULAR | Status: DC | PRN
Start: 2014-07-22 — End: 2014-07-23

## 2014-07-22 MED ORDER — GLYCOPYRROLATE 0.2 MG/ML IJ SOLN
INTRAMUSCULAR | Status: DC | PRN
  Administered 2014-07-22: 0.6 mg via INTRAVENOUS

## 2014-07-22 MED ORDER — ONDANSETRON HCL 4 MG/2ML IJ SOLN
INTRAMUSCULAR | Status: AC
  Filled 2014-07-22: qty 2

## 2014-07-22 MED ORDER — ONDANSETRON HCL 4 MG/2ML IJ SOLN: 4.0000 mg | Freq: Four times a day (QID) | INTRAMUSCULAR | Status: DC | PRN

## 2014-07-22 MED ORDER — BUPIVACAINE HCL (PF) 0.25 % IJ SOLN
INTRAMUSCULAR | Status: AC
  Filled 2014-07-22: qty 30

## 2014-07-22 MED ORDER — LIDOCAINE HCL (CARDIAC) 20 MG/ML IV SOLN
INTRAVENOUS | Status: AC
  Filled 2014-07-22: qty 5

## 2014-07-22 MED ORDER — ROCURONIUM BROMIDE 100 MG/10ML IV SOLN
INTRAVENOUS | Status: DC | PRN
  Administered 2014-07-22 (×2): 10 mg via INTRAVENOUS
  Administered 2014-07-22: 40 mg via INTRAVENOUS

## 2014-07-22 MED ORDER — GLYCOPYRROLATE 0.2 MG/ML IJ SOLN
INTRAMUSCULAR | Status: AC
  Filled 2014-07-22: qty 3

## 2014-07-22 MED ORDER — HEPARIN SODIUM (PORCINE) 5000 UNIT/ML IJ SOLN
INTRAMUSCULAR | Status: AC
  Filled 2014-07-22: qty 1

## 2014-07-22 MED ORDER — LIDOCAINE HCL (CARDIAC) 20 MG/ML IV SOLN
INTRAVENOUS | Status: DC | PRN
  Administered 2014-07-22: 40 mg via INTRAVENOUS

## 2014-07-22 MED ORDER — PHENYLEPHRINE HCL 10 MG/ML IJ SOLN
INTRAMUSCULAR | Status: DC | PRN
  Administered 2014-07-22 (×2): 40 ug via INTRAVENOUS

## 2014-07-22 MED ORDER — PHENYLEPHRINE 40 MCG/ML (10ML) SYRINGE FOR IV PUSH (FOR BLOOD PRESSURE SUPPORT)
PREFILLED_SYRINGE | INTRAVENOUS | Status: AC
  Filled 2014-07-22: qty 10

## 2014-07-22 MED ORDER — FENTANYL CITRATE 0.05 MG/ML IJ SOLN
INTRAMUSCULAR | Status: DC | PRN
  Administered 2014-07-22 (×2): 50 ug via INTRAVENOUS
  Administered 2014-07-22: 100 ug via INTRAVENOUS
  Administered 2014-07-22: 50 ug via INTRAVENOUS

## 2014-07-22 MED ORDER — ATENOLOL 100 MG PO TABS
100.0000 mg | ORAL_TABLET | Freq: Every day | ORAL | Status: DC
  Administered 2014-07-23 – 2014-07-24 (×2): 100 mg via ORAL
  Filled 2014-07-22 (×2): qty 1

## 2014-07-22 SURGICAL SUPPLY — 47 items
BARRIER ADHS 3X4 INTERCEED (GAUZE/BANDAGES/DRESSINGS) IMPLANT
BRR ADH 4X3 ABS CNTRL BYND (GAUZE/BANDAGES/DRESSINGS)
CANISTER SUCT 3000ML (MISCELLANEOUS) ×4 IMPLANT
CLOSURE WOUND 1/2 X4 (GAUZE/BANDAGES/DRESSINGS)
CLOTH BEACON ORANGE TIMEOUT ST (SAFETY) ×4 IMPLANT
DECANTER SPIKE VIAL GLASS SM (MISCELLANEOUS) IMPLANT
DRAPE CESAREAN BIRTH W POUCH (DRAPES) ×4 IMPLANT
DRAPE WARM FLUID 44X44 (DRAPE) ×2 IMPLANT
DRSG OPSITE POSTOP 4X10 (GAUZE/BANDAGES/DRESSINGS) ×4 IMPLANT
DRSG TEGADERM 2.38X2.75 (GAUZE/BANDAGES/DRESSINGS) IMPLANT
DRSG XEROFORM 1X8 (GAUZE/BANDAGES/DRESSINGS) ×4 IMPLANT
DURAPREP 26ML APPLICATOR (WOUND CARE) ×4 IMPLANT
GLOVE BIOGEL PI IND STRL 7.0 (GLOVE) ×4 IMPLANT
GLOVE BIOGEL PI IND STRL 8 (GLOVE) ×2 IMPLANT
GLOVE BIOGEL PI INDICATOR 7.0 (GLOVE) ×4
GLOVE BIOGEL PI INDICATOR 8 (GLOVE) ×2
GLOVE ECLIPSE 7.5 STRL STRAW (GLOVE) ×8 IMPLANT
GOWN STRL REUS W/TWL LRG LVL3 (GOWN DISPOSABLE) ×12 IMPLANT
NDL HYPO 25X1 1.5 SAFETY (NEEDLE) IMPLANT
NEEDLE HYPO 25X1 1.5 SAFETY (NEEDLE) ×4 IMPLANT
NS IRRIG 1000ML POUR BTL (IV SOLUTION) ×4 IMPLANT
PACK ABDOMINAL GYN (CUSTOM PROCEDURE TRAY) ×4 IMPLANT
PAD OB MATERNITY 4.3X12.25 (PERSONAL CARE ITEMS) ×4 IMPLANT
PROTECTOR NERVE ULNAR (MISCELLANEOUS) ×4 IMPLANT
RETRACTOR WND ALEXIS 25 LRG (MISCELLANEOUS) IMPLANT
RTRCTR WOUND ALEXIS 25CM LRG (MISCELLANEOUS)
SPONGE GAUZE 4X4 12PLY STER LF (GAUZE/BANDAGES/DRESSINGS) ×8 IMPLANT
SPONGE LAP 18X18 X RAY DECT (DISPOSABLE) ×8 IMPLANT
STAPLER VISISTAT 35W (STAPLE) ×4 IMPLANT
STRIP CLOSURE SKIN 1/2X4 (GAUZE/BANDAGES/DRESSINGS) IMPLANT
SUT CHROMIC 3 0 SH 27 (SUTURE) IMPLANT
SUT VIC AB 0 CT1 18XCR BRD8 (SUTURE) ×6 IMPLANT
SUT VIC AB 0 CT1 36 (SUTURE) IMPLANT
SUT VIC AB 0 CT1 8-18 (SUTURE) ×12
SUT VIC AB 1 CT1 18XBRD ANBCTR (SUTURE) IMPLANT
SUT VIC AB 1 CT1 8-18 (SUTURE)
SUT VIC AB 3-0 CT1 27 (SUTURE) ×8
SUT VIC AB 3-0 CT1 TAPERPNT 27 (SUTURE) ×4 IMPLANT
SUT VIC AB 3-0 SH 27 (SUTURE) ×4
SUT VIC AB 3-0 SH 27X BRD (SUTURE) ×2 IMPLANT
SUT VICRYL 0 TIES 12 18 (SUTURE) ×4 IMPLANT
SUT VICRYL 3 0 BR 18  UND (SUTURE) ×2
SUT VICRYL 3 0 BR 18 UND (SUTURE) IMPLANT
SYR CONTROL 10ML LL (SYRINGE) ×2 IMPLANT
TOWEL OR 17X24 6PK STRL BLUE (TOWEL DISPOSABLE) ×8 IMPLANT
TRAY FOLEY CATH 14FR (SET/KITS/TRAYS/PACK) ×4 IMPLANT
WATER STERILE IRR 1000ML POUR (IV SOLUTION) ×4 IMPLANT

## 2014-07-22 NOTE — Op Note (Signed)
Operative Note  07/22/2014  9:09 AM  PATIENT:  Laurie Pratt  74 y.o. female  PRE-OPERATIVE DIAGNOSIS:  CERVICAL INTRAEPITHELIAL NEOPLASIA III  POST-OPERATIVE DIAGNOSIS:  cervical intraepitheial neoplasia III  PROCEDURE:  Procedure(s): HYSTERECTOMY ABDOMINAL BILATERAL SALPINGO OOPHORECTOMY Pelvic washings Lysis of pelvic adhesions  SURGEON:  Surgeon(s): Terrance Mass, MD Anastasio Auerbach, MD  ANESTHESIA:   general  FINDINGS: Small normal size uterus with normal appearing fallopian tubes atrophic ovaries pelvic adhesions were noted in the cul-de-sac and pelvic sidewalls.  DESCRIPTION OF OPERATION:The patient was taken to the operating room and was placed in a dorsal supine position. She was then placed under general anesthesia and intubated. A time out was undertaken to correctly identify the patient and procedure to be undertaken. The procedure was begun by performing a Pfannenstiel skin Incision with the first knife. The incision was extended to the level of the fascia using Bovie coagulation. The fascia was scored bilaterally at the midline. The fascial incision was then extended in a curvilinear fashion using the Mayo scissors. The fascia was dissected from the muscular area below by both sharp and blunt dissection. The muscular area was dissected along the midline by both sharp and blunt dissection. The peritoneum was subsequently entered. A Maylard incision was made on both abdominals rectus muscle for additional exposure The incision was then extended in a vertical fashion using Metzenbaum scissors.  The right And left ovary were within normal limits.A self-retaining O'Connor-O'Sullivan retractor was placed into the abdomen. The abdominal contents were packed in the upper abdomen using 2 clean wet laps. Two large Kelly clamps were placed above the cornual portion of the uterus, and the hysterectomy was begun by transecting the round ligaments, which were subsequently suture  ligated with 0 Vicryl suture and tag. The cul-de-sac pelvic adhesions were lysed as well. Uterovesical peritoneum was subsequently incised with the Metzenbaum scissors, and incision was extended to the sidewall on both side allowing dissection to the sidewall and subsequent visualization of the uterus. Once the uterus was felt free, the infundibulopelvic ligaments were then grasped with 2 curved Haney clamps, subsequently cut and the pedicle was initially free tied with 0 Vicryl followed by transfixation stitch of the same suture and removing the tube and ovary.The uterine vessels were subsequently skeletonized, subsequently grasped with curved Heaney clamps, cut and suture ligated with 0 Vicryl suture. Cardinal ligaments on either side were subsequently grasped, cut and suture ligated with 0 Vicryl suture to the level of the uterosacral ligaments, which were then grasped with curved Haney clamps, cut and suture ligated with 0 Vicryl suture. These sutures were left long and tagged with hemostats. Subsequently, 2 large clamps were placed about the upper vaginal cuff. The specimen was subsequently dissected free with the Jorgenson scissors, and the pedicles were suture ligated with 0 Vicryl suture. The vaginal cuff was also closed with 0 Vicryl suture in an interrupted fashion. Subsequently, the pelvis was irrigated until clear. Of note prior to commencement of the operation pelvic washings were obtained for cytological evaluation. Points of bleeding were secured either by Bovie coagulation or by suture ligature with a 0 Vicryl suture. Once hemostasis was achieved, surgical cell was placed in the lateral pelvic sidewalls and on the vaginal cuff for additional hemostasis. the O'Connor-O'Sullivan retractor was subsequently removed. Laps were subsequently removed and the abdominal wall was closed in the following fashion. The fascial layers were approximated with 0 Vicryl with sutures coming from the closing angles  interlocking every third  towards the midline. The subcutaneous layer was reapproximated with 3-0 Vicryl suture. The skin edges were reapproximated with staples. For postoperative analgesia Exparel 1.3% was infiltrated at the level of the peritoneum, fascia and subcutaneous for a total of 30 cc.The patient tolerated the procedure well. The uterus and cervix, bilateral tubes and ovaries were  passed off the operative field for histological evaluation. The patient was extubated and transferred to recovery room stable vital signs. She received Toradol 30 mg IV in route to the recovery room.      ESTIMATED BLOOD LOSS:  150 cc's   Intake/Output Summary (Last 24 hours) at 07/22/14 [id number] Last data filed at 07/22/14 0850  Gross per 24 hour  Intake   1400 ml  Output    450 ml  Net    950 ml     BLOOD ADMINISTERED:none   LOCAL MEDICATIONS USED:  Exparel  SPECIMEN:  Source of Specimen:  Uterus, cervix tubes, ovaries and pelvic washings  DISPOSITION OF SPECIMEN:  PATHOLOGY  COUNTS:  YES  PLAN OF CARE: Transfer to PACU  [facility] HMD9:09 AMTD@

## 2014-07-22 NOTE — H&P (View-Only) (Signed)
Laurie Pratt is an 74 y.o. female. Jehovah's Witness who presented to the office today for preoperative consultation examination. Her history is as follows:  74 year old patient presented to the office for colonoscopy as a result of her recent abnormal Pap smear. Her dysplasia history and GYN oncology consultation as follows:  Patient was seen the office on August 7 as a new patient and had beencomplaining at that time a postcoital bleeding when she became sexually active. She had not had a gynecological exam or Pap smear 4 years prior to that. She had reported normal Pap smears prior to that incident. Her Pap smear demonstrated the following:  Diagnosis  SQUAMOUS CELL CARCINOMA.  Patient was then referred to GYN oncologist Dr. Charlaine Dalton. He subsequently did cervical biopsies and eventually took the patient to the operating room September 2 and did a cold knife cervical conization in 2014 with pathology report as follows:  Diagnosis  Cervix, cone  - LOW TO HIGH GRADE SQUAMOUS INTRAEPITHELIAL LESION, CIN I-III (MILD TO SEVERE  DYSPLASIA/CIS) WITH ENDOCERVICAL GLANDULAR EXTENSION, SEE COMMENT.  - HIGH GRADE DYSPLASIA PRESENT AT ECTOCERVICAL MARGIN.  - ENDOCERVICAL MARGIN, NEGATIVE FOR DYSPLASIA.  Microscopic Comment  There is extensive dysplasia present demonstrating a range of grade from mild to severe squamous  dysplasia/in situ carcinoma. The ectocervical margin is involved by high grade dysplasia at the 3 to 6 o'clock  and 9 to 12 o'clock aspect of the tissue. The findings correlate with the previous Pap test results  The patient was last seen by him of October 2014 and he had recommended abdominal hysterectomy with bilateral salpingo-oophorectomy but patient refused at the present time.   Her Pap smear October October 7 demonstrated the following: Diagnosis LOW GRADE SQUAMOUS INTRAEPITHELIAL LESION: CIN-1/ HPV (LSIL). RECOMMENDATION THERE ARE A FEW CELLS  SUGGESTIVE OF A HIGHER GRADE LESION. CLINICAL CORRELATION IS RECOMMENDED.  On 05/14/2014 patient underwent a detail colposcopic evaluation with pathology report as follows:  Diagnosis 1. Cervix, biopsy, 12 o'clock - HYPER AND PARAKERATOTIC SQUAMOUS MUCOSA WITH LOW GRADE SQUAMOUS INTRAEPITHELIAL LESION, CIN-I (MILD DYSPLASIA). - TRANSFORMATION ZONE MUCOSA IS NOT PRESENT. 2. Cervix, biopsy, 9 o'clock - HYPER AND PARAKERATOTIC SQUAMOUS MUCOSA WITH LOW GRADE SQUAMOUS INTRAEPITHELIAL LESION, CIN-I (MILD DYSPLASIA). - TRANSFORMATION ZONE MUCOSA IS NOT PRESENT. 3. Cervix, biopsy, 3 o'clock - SQUAMOUS MUCOSA WITH LOW GRADE SQUAMOUS INTRAEPITHELIAL LESION, CIN-I (MILD DYSPLASIA). - TRANSFORMATION ZONE MUCOSA IS NOT PRESENT.  In October of this past year patient had an ultrasound because she was feeling bloated and the ultrasound demonstrated the following:  Uterus measured 5.5 x 4.2 x 3.5 cm with endometrial stripe of 2.6 mm. Patient had one isolated small posterior fibroid measured 17 x 18 x 16 mm. Right ovary was atrophic left ovary not seen. Endometrial stripe was 2.6 mm. Excess of bowel shadowing was noted in the left adnexa. No fluid in the cul-de-sac.  Case had been discussed with her GYN oncologist Dr. Charlaine Dalton who had recommended to proceed with total abdominal hysterectomy with bilateral salpingo-oophorectomy as a result of her narrow vagina and friable cervix.  Patient had preoperative consultation for medical clearance with her PCP Dr. Dorann Lodge cough who cleared her for surgery. See entry dated 06/09/2014 in Epic for further details.   Pertinent Gynecological History: Menses: post-menopausal Bleeding: none Contraception: post menopausal status DES exposure: denies Blood transfusions: none Sexually transmitted diseases: no past history Previous GYN Procedures: Cone  BiopsyNSVD X 2  Last mammogram: normal Date: 2014 Last pap:  abnormal: See above Date: 2015 OB  History: G2, P2   Menstrual History: Menarche age: 75  No LMP recorded. Patient is postmenopausal.    Past Medical History  Diagnosis Date  . Hypertension   . Tics of organic origin     Dr. Doy Mince  . Hyperlipidemia   . CIN III (cervical intraepithelial neoplasia grade III) with severe dysplasia     Past Surgical History  Procedure Laterality Date  . Eye surgery      left eye  . Dilation and curettage of uterus    . Cervical conization w/bx N/A 03/12/2013    Procedure: COLD KNIFE CONIZATION CERVIX WITH BIOPSY/EXAM UNDER ANESTHESIA, RECTAL EXAM;  Surgeon: Alvino Chapel, MD;  Location: [place];  Service: Gynecology;  Laterality: N/A;  . Cervical conization w/bx N/A 03/12/2013    Procedure: CONIZATION CERVIX WITH BIOPSY;  Surgeon: Alvino Chapel, MD;  Location: [facility];  Service: Gynecology;  Laterality: N/A;  patient returned to OR for fulgeration to stop bleeding    Family History  Problem Relation Age of Onset  . Hypertension Other   . Hypertension Mother   . Diabetes Father   . Hypertension Father   . Heart disease Maternal Aunt     CHF    Social History:  reports that she has never smoked. She has never used smokeless tobacco. She reports that she does not drink alcohol or use illicit drugs.  Allergies:  Allergies  Allergen Reactions  . Ace Inhibitors   . Doxazosin Mesylate   . Verapamil     REACTION: legs swelling, tired     (Not in a hospital admission)  REVIEW OF SYSTEMS: A ROS was performed and pertinent positives and negatives are included in the history.  GENERAL: No fevers or chills. HEENT: No change in vision, no earache, sore throat or sinus congestion. NECK: No pain or stiffness. CARDIOVASCULAR: No chest pain or pressure. No palpitations. PULMONARY: No shortness of breath, cough or wheeze. GASTROINTESTINAL: No abdominal pain, nausea, vomiting or diarrhea, melena or bright red blood per rectum.  GENITOURINARY: No urinary frequency, urgency, hesitancy or dysuria. MUSCULOSKELETAL: No joint or muscle pain, no back pain, no recent trauma. DERMATOLOGIC: No rash, no itching, no lesions. ENDOCRINE: No polyuria, polydipsia, no heat or cold intolerance. No recent change in weight. HEMATOLOGICAL: No anemia or easy bruising or bleeding. NEUROLOGIC: No headache, seizures, numbness, tingling or weakness. PSYCHIATRIC: No depression, no loss of interest in normal activity or change in sleep pattern.     Blood pressure 138/90.  Physical Exam:  HEENT:unremarkable Neck:Supple, midline, no thyroid megaly, no carotid bruits Lungs:  Clear to auscultation no rhonchi's or wheezes Heart:Regular rate and rhythm, no murmurs or gallops Breast Exam: Both breasts are symmetrical in appearance no palpable masses or tenderness no supra clavicular axillary lymphadenopathy Abdomen: Soft nontender no rebound or guarding Pelvic:BUS atrophic changes Vagina: Atrophic changes Cervix: Flat atrophic changes was friable patient currently on estrogen vaginal cream Uterus: Anteverted normal size shape and consistency Adnexa: No palpable masses or tenderness Extremities: No cords, no edema Rectal: Unremarkable   Assessment/Plan:Patient with cervical cone biopsy 2014 indicating CIN-1-CIN-3 with endocervical glandular extension high-grade dysplasia present at the ectocervical margin. Endocervical margin was reportedly negative for dysplasia. Follow-up Pap smear this year with low-grade dysplasia suspicious of higher grade lesion colposcopic evaluation and biopsy 05/14/2014 indicated all areas biopsied had demonstrated CIN-1. Case had been discussed with the GYN oncologist Dr. Quillian Quince o'clock Pearson had recommended proceeding  with a total abdominal hysterectomy with bilateral salpingo-oophorectomy. Because of flat cervix from past cervical conization friability she had been placed on Premarin vaginal cream twice a week which she  will continue until the end of this week. 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 She will also get together with the anesthesiologist before surgery as well. Patient was cleared for surgery by Dr. Alain Marion. Risk associated with surgery and discuss with patient as follows:                        Patient was counseled as to the risk of surgery to include the following:  1. Infection (prohylactic antibiotics will be administered)  2. DVT/Pulmonary Embolism (prophylactic pneumo compression stockings will be used)  3.Trauma to internal organs requiring additional surgical procedure to repair any injury to     Internal organs requiring perhaps additional hospitalization days.  4.Hemmorhage requiring transfusion and blood products which carry risks such as             anaphylactic reaction, hepatitis and AIDS  Patient had received literature information on the procedure scheduled and all her questions were answered and fully accepts all risk.   [facility] HMD10:00 AMTD     Terrance Mass 07/15/2014, 9:33 AM  Note: This dictation was prepared with  Dragon/digital dictation along withSmart phrase technology. Any transcriptional errors that result from this process are unintentional.

## 2014-07-22 NOTE — Anesthesia Postprocedure Evaluation (Signed)
Anesthesia Post Note  Patient: Laurie Pratt  Procedure(s) Performed: Procedure(s) (LRB): HYSTERECTOMY ABDOMINAL (N/A) BILATERAL SALPINGO OOPHORECTOMY (Bilateral)  Anesthesia type: GA  Patient location: PACU  Post pain: Pain level controlled  Post assessment: Post-op Vital signs reviewed  Last Vitals:  Filed Vitals:   07/22/14 1045  BP: 164/75  Pulse: 60  Temp:   Resp: 20    Post vital signs: Reviewed  Level of consciousness: sedated  Complications: No apparent anesthesia complications

## 2014-07-22 NOTE — Anesthesia Procedure Notes (Signed)
Procedure Name: Intubation Date/Time: 07/22/2014 7:29 AM Performed by: Raenette Rover Pre-anesthesia Checklist: Patient identified, Emergency Drugs available, Suction available and Patient being monitored Patient Re-evaluated:Patient Re-evaluated prior to inductionOxygen Delivery Method: Circle system utilized Preoxygenation: Pre-oxygenation with 100% oxygen Intubation Type: IV induction Ventilation: Oral airway inserted - appropriate to patient size and Mask ventilation without difficulty Laryngoscope Size: Miller and 3 Grade View: Grade I Tube type: Oral Tube size: 7.0 mm Number of attempts: 1 Airway Equipment and Method: Bite block Placement Confirmation: ETT inserted through vocal cords under direct vision,  positive ETCO2,  CO2 detector and breath sounds checked- equal and bilateral Secured at: 21 cm Tube secured with: Tape Dental Injury: Teeth and Oropharynx as per pre-operative assessment

## 2014-07-22 NOTE — Transfer of Care (Signed)
Immediate Anesthesia Transfer of Care Note  Patient: Laurie Pratt  Procedure(s) Performed: Procedure(s): HYSTERECTOMY ABDOMINAL (N/A) BILATERAL SALPINGO OOPHORECTOMY (Bilateral)  Patient Location: PACU  Anesthesia Type:General  Level of Consciousness: awake, alert , oriented and patient cooperative  Airway & Oxygen Therapy: Patient Spontanous Breathing and Patient connected to nasal cannula oxygen  Post-op Assessment: Report given to PACU RN and Post -op Vital signs reviewed and stable  Post vital signs: Reviewed and stable  Complications: No apparent anesthesia complications

## 2014-07-22 NOTE — Anesthesia Preprocedure Evaluation (Signed)
Anesthesia Evaluation  Patient identified by MRN, date of birth, ID band Patient awake    Reviewed: Allergy & Precautions, H&P , NPO status , Patient's Chart, lab work & pertinent test results, reviewed documented beta blocker date and time   Airway Mallampati: I  TM Distance: >3 FB Neck ROM: full    Dental no notable dental hx. (+) Upper Dentures, Lower Dentures   Pulmonary neg pulmonary ROS,    Pulmonary exam normal       Cardiovascular hypertension, Pt. on medications and Pt. on home beta blockers     Neuro/Psych    GI/Hepatic negative GI ROS, Neg liver ROS,   Endo/Other  negative endocrine ROS  Renal/GU negative Renal ROS     Musculoskeletal   Abdominal Normal abdominal exam  (+)   Peds  Hematology negative hematology ROS (+)   Anesthesia Other Findings   Reproductive/Obstetrics negative OB ROS                             Anesthesia Physical Anesthesia Plan  ASA: II  Anesthesia Plan: General   Post-op Pain Management:    Induction: Intravenous  Airway Management Planned: Oral ETT  Additional Equipment:   Intra-op Plan:   Post-operative Plan: Extubation in OR  Informed Consent: I have reviewed the patients History and Physical, chart, labs and discussed the procedure including the risks, benefits and alternatives for the proposed anesthesia with the patient or authorized representative who has indicated his/her understanding and acceptance.   Dental Advisory Given  Plan Discussed with: CRNA and Surgeon  Anesthesia Plan Comments:         Anesthesia Quick Evaluation

## 2014-07-22 NOTE — Interval H&P Note (Signed)
History and Physical Interval Note:  07/22/2014 7:07 AM  Laurie Pratt  has presented today for surgery, with the diagnosis of CERVICAL INTRAEPITHELIAL NEOPLASIA III  The various methods of treatment have been discussed with the patient and family. After consideration of risks, benefits and other options for treatment, the patient has consented to  Procedure(s): HYSTERECTOMY ABDOMINAL (N/A) BILATERAL SALPINGO OOPHORECTOMY (Bilateral) as a surgical intervention .  The patient's history has been reviewed, patient examined, no change in status, stable for surgery.  I have reviewed the patient's chart and labs.  Questions were answered to the patient's satisfaction.     Terrance Mass

## 2014-07-23 LAB — BASIC METABOLIC PANEL
Anion gap: 10 (ref 5–15)
BUN: 13 mg/dL (ref 6–23)
CHLORIDE: 102 meq/L (ref 96–112)
CO2: 26 mmol/L (ref 19–32)
Calcium: 9.2 mg/dL (ref 8.4–10.5)
Creatinine, Ser: 1.18 mg/dL — ABNORMAL HIGH (ref 0.50–1.10)
GFR calc non Af Amer: 45 mL/min — ABNORMAL LOW (ref >90)
GFR, EST AFRICAN AMERICAN: 52 mL/min — AB (ref >90)
Glucose, Bld: 106 mg/dL — ABNORMAL HIGH (ref 70–99)
Potassium: 4.8 mmol/L (ref 3.5–5.1)
Sodium: 138 mmol/L (ref 135–145)

## 2014-07-23 LAB — CBC
HCT: 35.7 % — ABNORMAL LOW (ref 36.0–46.0)
Hemoglobin: 11.9 g/dL — ABNORMAL LOW (ref 12.0–15.0)
MCH: 29 pg (ref 26.0–34.0)
MCHC: 33.3 g/dL (ref 30.0–36.0)
MCV: 86.9 fL (ref 78.0–100.0)
PLATELETS: 252 10*3/uL (ref 150–400)
RBC: 4.11 MIL/uL (ref 3.87–5.11)
RDW: 14.4 % (ref 11.5–15.5)
WBC: 10 10*3/uL (ref 4.0–10.5)

## 2014-07-23 MED ORDER — BISACODYL 10 MG RE SUPP
10.0000 mg | Freq: Once | RECTAL | Status: AC
  Administered 2014-07-23: 10 mg via RECTAL
  Filled 2014-07-23: qty 1

## 2014-07-23 NOTE — Progress Notes (Signed)
Wasted 21 ml ( 210 mcg) Fentanyl PCA in the med room sink.  Witnessed by Blain Pais RN

## 2014-07-23 NOTE — Addendum Note (Signed)
Addendum  created 07/23/14 0918 by Raenette Rover, CRNA   Modules edited: Notes Section   Notes Section:  File: [id number]

## 2014-07-23 NOTE — Progress Notes (Signed)
1 Day Post-Op Procedure(s) (LRB): HYSTERECTOMY ABDOMINAL (N/A) BILATERAL SALPINGO OOPHORECTOMY (Bilateral)  Subjective: Patient reports incisional pain and + flatus.    Objective: I have reviewed patient's vital signs, intake and output, medications and labs.  Preoperative hemoglobin 13.2 postop hemoglobin 11.9 electrolytes normal range blood sugar 106   General: alert and cooperative Resp: clear to auscultation bilaterally Cardio: regular rate and rhythm, S1, S2 normal, no murmur, click, rub or gallop GI: soft, non-tender; bowel sounds normal; no masses,  no organomegaly Extremities: extremities normal, atraumatic, no cyanosis or edema Vaginal Bleeding: none incision intact  Assessment: s/p Procedure(s): HYSTERECTOMY ABDOMINAL (N/A) BILATERAL SALPINGO OOPHORECTOMY (Bilateral): stable and progressing well  Plan: Advance diet Encourage ambulation Advance to PO medication Clear liquids  LOS: 1 day    Laurie Pratt H 07/23/2014, 8:11 AM

## 2014-07-23 NOTE — Anesthesia Postprocedure Evaluation (Signed)
  Anesthesia Post-op Note  Patient: Laurie Pratt  Procedure(s) Performed: Procedure(s): HYSTERECTOMY ABDOMINAL (N/A) BILATERAL SALPINGO OOPHORECTOMY (Bilateral)  Patient Location: [facility]  Anesthesia Type:General  Level of Consciousness: awake, alert , oriented and patient cooperative  Airway and Oxygen Therapy: Patient Spontanous Breathing  Post-op Pain: mild  Post-op Assessment: Post-op Vital signs reviewed, Patient's Cardiovascular Status Stable, Respiratory Function Stable, Patent Airway and No signs of Nausea or vomiting  Post-op Vital Signs: Reviewed and stable  Last Vitals:  Filed Vitals:   07/23/14 0604  BP: 142/53  Pulse: 68  Temp: 37.3 C  Resp: 18    Complications: No apparent anesthesia complications

## 2014-07-24 ENCOUNTER — Encounter (HOSPITAL_COMMUNITY): Payer: Self-pay | Admitting: Gynecology

## 2014-07-24 NOTE — Progress Notes (Signed)
Pt ambulated out with daughter  Teaching complete

## 2014-07-24 NOTE — Discharge Summary (Signed)
Physician Discharge Summary  Patient ID: Laurie Pratt MRN: [id number] DOB/AGE: 08-26-1940 74 y.o.  Admit date: 07/22/2014 Discharge date: 07/24/2014  Admission Diagnoses: CIN-3  Discharge Diagnoses: CIN-3 Active Problems:   Post-operative state   Discharged Condition: good  Hospital Course: Patient was admitted on January 12 where she underwent a total abdominal hysterectomy with bilateral salpingo-oophorectomy as a result of history of CIN-3 status post cervical conization with positive endocervical margins. Patient did well postoperatively. Her bowel movements were slow so we kept her on full liquids for the first 24-36 hours and her diet was advanced. She had adequate urine output in the Foley was discontinued within 24 hours. She was started on her blood pressure medication the first postop day. Does not she was up and ambulating had a small bowel movement and tolerated regular diet was rated be discharged home. Patient fully before surgery had hypokalemia with a value of 3.0 after by mouth potassium prior to her surgery it was noted her potassium level was back to normal at 4.2. Postop potassium level was 4.8.  Consults: None  Significant Diagnostic Studies: labs: Preop hemoglobin 13.2 postop hemoglobin 11.9 potassium 4.8 blood sugar 106    Treatments: surgery: Total abdominal hysterectomy bilateral salpingo-oophorectomy and lysis of pelvic adhesions  Discharge Exam: Blood pressure 128/66, pulse 72, temperature 99.4 F (37.4 C), temperature source Oral, resp. rate 18, height 5\' 7"  (1.702 m), weight 173 lb (78.472 kg), SpO2 94 %. General appearance: alert  Heart: Regular rate and rhythm no murmurs or gallops Lungs: Clear to auscultation rhonchus or wheezes abdomen: Soft nontender no rebound or guarding positive bowel sounds all 4 quadrants Pfannenstiel incision intact Vaginal pad rise Extremities: No cords or edema  Disposition: 01-Home or Self Care  Discharge  Instructions    Call MD for:  redness, tenderness, or signs of infection (pain, swelling, bleeding, redness, odor or green/yellow discharge around incision site)    Complete by:  As directed      Call MD for:  severe or increased pain, loss or decreased feeling  in affected limb(s)    Complete by:  As directed      Call MD for:  temperature >100.5    Complete by:  As directed      Discharge instructions    Complete by:  As directed   Please come by the office on Tuesday, January 19 at 1 PM to have staples removed  Hysterectomy, Abdominal & Vaginal Care After Refer to this sheet in the next few weeks. These discharge instructions provide you with general information on caring for yourself after you leave the hospital. Your caregiver may also give you specific instructions. Your treatment has been planned according to the most current medical practices available, but unavoidable complications sometimes occur. If you have any problems or questions after discharge, please call your caregiver. HOME CARE INSTRUCTIONS Healing will take time. You will have tenderness at the surgery site. There may be some swelling and bruising around this area if you had an abdominal hysterectomy. Have an adult stay with you the first 48 to 72 hours after surgery, and then for 1 to 2 weeks afterward to help with daily activities. Only take over-the-counter or prescription medicines for pain, discomfort, or fever as directed by your caregiver.  Do not take aspirin. It can cause bleeding.  Do not drive when taking pain medicine.  It will be normal to be sore for a couple weeks after surgery. See your caregiver if this seems to  be getting worse rather than better.  Follow your caregiver's advice regarding diet, exercise, lifting, driving, and general activities.  Take showers instead of baths for a few weeks as directed.  You may resume your usual diet as directed.  Get plenty of rest and sleep.  Do not douche, use  tampons, or have sexual intercourse until your caregiver says it is okay.  Change your bandages (dressings) as directed if you had an abdominal hysterectomy.  Take your temperature twice a day and write it down.  Do not drink alcohol until your caregiver says it is okay.  If you develop constipation, you may take a mild laxative with your caregiver's permission. Eating bran foods helps with constipation problems. Drink enough water and fluids to keep your urine clear or pale yellow.  Do not sign any legal documents until you feel normal again.  Keep all of your follow-up appointments.  Make sure you and your family understand everything about your operation and recovery.  SEEK MEDICAL CARE IF: There is swelling, redness, or increasing pain in the wound area.  Fluid (pus) is coming from the wound.  You notice a bad smell coming from the wound or surgical dressing.  You have pain, redness, and swelling from the intravenous (IV) site.  The wound breaks open.  You feel dizzy.  You develop pain or bleeding when you urinate.  You develop diarrhea.  You feel sick to your stomach (nauseous) and throw up (vomit).  You develop abnormal vaginal discharge.  You develop a rash.  You have any type of abnormal reaction or develop an allergy to your medicine.  Your pain medicine is not relieving your pain.  seek immediate medical care if: You have an oral temperature above 100, not controlled by medicine. HYYou develop chest pain.  You develop shortness of breath.  You pass out (faint).  You develop pain, swelling, or redness of your leg.  You develop heavy vaginal bleeding, with or without blood clots.  MAKE SURE YOU: Understand these instructions.  Will watch your condition.  Will get help right away if you are not doing well or get worse.  Document Released: 09/17/2003 Document Re-Released: 12/15/2009 [facility] Patient Information 2011 [place].   [facility] HMD7:39 AMTD@      Driving Restrictions    Complete by:  As directed   No driving for 1-2 weeks     Lifting restrictions    Complete by:  As directed   No lifting for 6 weeks     Resume previous diet    Complete by:  As directed             Medication List    STOP taking these medications        ciprofloxacin 250 MG tablet  Commonly known as:  CIPRO     conjugated estrogens vaginal cream  Commonly known as:  PREMARIN     potassium chloride SA 20 MEQ tablet  Commonly known as:  K-DUR,KLOR-CON      TAKE these medications        atenolol-chlorthalidone 100-25 MG per tablet  Commonly known as:  TENORETIC  take 1 tablet by mouth every morning     cholecalciferol 1000 UNITS tablet  Commonly known as:  VITAMIN D  Take 1 tablet (1,000 Units total) by mouth daily.     losartan 100 MG tablet  Commonly known as:  COZAAR  take 1 tablet by mouth once daily     trihexyphenidyl 2 MG tablet  Commonly known as:  ARTANE  Take 1.5 tablets (3 mg total) by mouth 3 (three) times daily.         SignedTerrance Mass 07/24/2014, 7:40 AM

## 2014-07-29 ENCOUNTER — Ambulatory Visit: Payer: Medicare Other | Admitting: Gynecology

## 2014-07-31 ENCOUNTER — Encounter: Payer: Self-pay | Admitting: Gynecology

## 2014-07-31 ENCOUNTER — Ambulatory Visit (INDEPENDENT_AMBULATORY_CARE_PROVIDER_SITE_OTHER): Payer: Medicare Other | Admitting: Gynecology

## 2014-07-31 VITALS — BP 136/78

## 2014-07-31 DIAGNOSIS — Z4802 Encounter for removal of sutures: Secondary | ICD-10-CM

## 2014-07-31 NOTE — Progress Notes (Signed)
   Patient presented to the office to have her staples removed. On 07/22/2014 patient underwent a total abdominal hysterectomy with bilateral salpingo-oophorectomy and lysis of pelvic adhesions as a result of her history of CIN-3. Patient is doing well. Her pathology report as follows was discussed with the patient:  Diagnosis 1. Cervix, resection w/o uterus HIGH GRADE SQUAMOUS INTRAEPITHELIAL LESION, CIN-II (MODERATE DYSPLASIA) AND ADJACENT LOW GRADE SQUAMOUS INTRAEPITHELIAL LESION, CIN-I (MILD DYSPLASIA). MARGINS NEGATIVE FOR DYSPLASIA OR MALIGNANCY. 2. Uterus, ovaries and fallopian tubes ENDOMETRIUM: BENIGN PROLIFERATIVE ENDOMETRIUM, NO ATYPIA, HYPERPLASIA OR MALIGNANCY. MYOMETRIUM: LEIOMYOMA AND ADENOMYOSIS. UTERINE SEROSA: UNREMARKABLE. BILATERAL OVARIES AND FALLOPIAN TUBES: NO PATHOLOGIC ABNORMALITIES. Microscopic Comment 1. The dysplastic squamous epithelium is positive for p16 and shows increase of Ki-67 stain.  Her staples were removed and incision Steri-Stripped and she will return back in 2 weeks for first complete postop exam.

## 2014-08-08 ENCOUNTER — Telehealth: Payer: Self-pay | Admitting: Nurse Practitioner

## 2014-08-08 MED ORDER — TRIHEXYPHENIDYL HCL 2 MG PO TABS: 3.0000 mg | ORAL_TABLET | Freq: Three times a day (TID) | ORAL | Status: DC

## 2014-08-08 NOTE — Telephone Encounter (Signed)
Rx has been sent.  I called back, got no answer.  No option to leave message.

## 2014-08-08 NOTE — Telephone Encounter (Signed)
Patient requesting Rx refill for trihexyphenidyl (ARTANE) 2 MG tablet forwarded to [facility].  Patient has only three pills left.  Please call and advise.

## 2014-08-14 ENCOUNTER — Ambulatory Visit (INDEPENDENT_AMBULATORY_CARE_PROVIDER_SITE_OTHER): Payer: Medicare Other | Admitting: Gynecology

## 2014-08-14 ENCOUNTER — Encounter: Payer: Self-pay | Admitting: Gynecology

## 2014-08-14 VITALS — BP 134/70

## 2014-08-14 DIAGNOSIS — Z09 Encounter for follow-up examination after completed treatment for conditions other than malignant neoplasm: Secondary | ICD-10-CM

## 2014-08-14 NOTE — Progress Notes (Signed)
   Patient presented to the office for her 3 weeks postop visit. Patient is doing well and is no longer taking narcotic for pain relief. No complaints today. On 07/22/2014 patient underwent a total abdominal hysterectomy with bilateral salpingo-oophorectomy and lysis of pelvic adhesions as a result of her history of CIN-3. Patient is doing well. Her pathology report as follows was discussed with the patient:  Diagnosis 1. Cervix, resection w/o uterus HIGH GRADE SQUAMOUS INTRAEPITHELIAL LESION, CIN-II (MODERATE DYSPLASIA) AND ADJACENT LOW GRADE SQUAMOUS INTRAEPITHELIAL LESION, CIN-I (MILD DYSPLASIA). MARGINS NEGATIVE FOR DYSPLASIA OR MALIGNANCY. 2. Uterus, ovaries and fallopian tubes ENDOMETRIUM: BENIGN PROLIFERATIVE ENDOMETRIUM, NO ATYPIA, HYPERPLASIA OR MALIGNANCY. MYOMETRIUM: LEIOMYOMA AND ADENOMYOSIS. UTERINE SEROSA: UNREMARKABLE. BILATERAL OVARIES AND FALLOPIAN TUBES: NO PATHOLOGIC ABNORMALITIES. Microscopic Comment 1. The dysplastic squamous epithelium is positive for p16 and shows increase of Ki-67 stain.  Exam: Abdominal exam: Pfannenstiel incision intact. Abdomen soft nontender no rebound guarding Pelvic: Bartholin urethra Skene was within normal limits Vagina: Atrophic changes Vaginal cuff intact Bimanual exam unremarkable Rectal exam not done  Assessment/plan: Patient status post total abdominal hysterectomy with bilateral salpingo-oophorectomy secondary high-grade dysplasia is doing well margins were negative on pathology report. Patient to return back to the office in 3 weeks for final postop visit.

## 2014-08-24 IMAGING — CR DG CHEST 2V
2 series · 2 of 2 positions shown · non-contrast
Comparison: None.

CLINICAL DATA: Preop for pelvic examination

CHEST - 2 VIEW

[w chest pa]
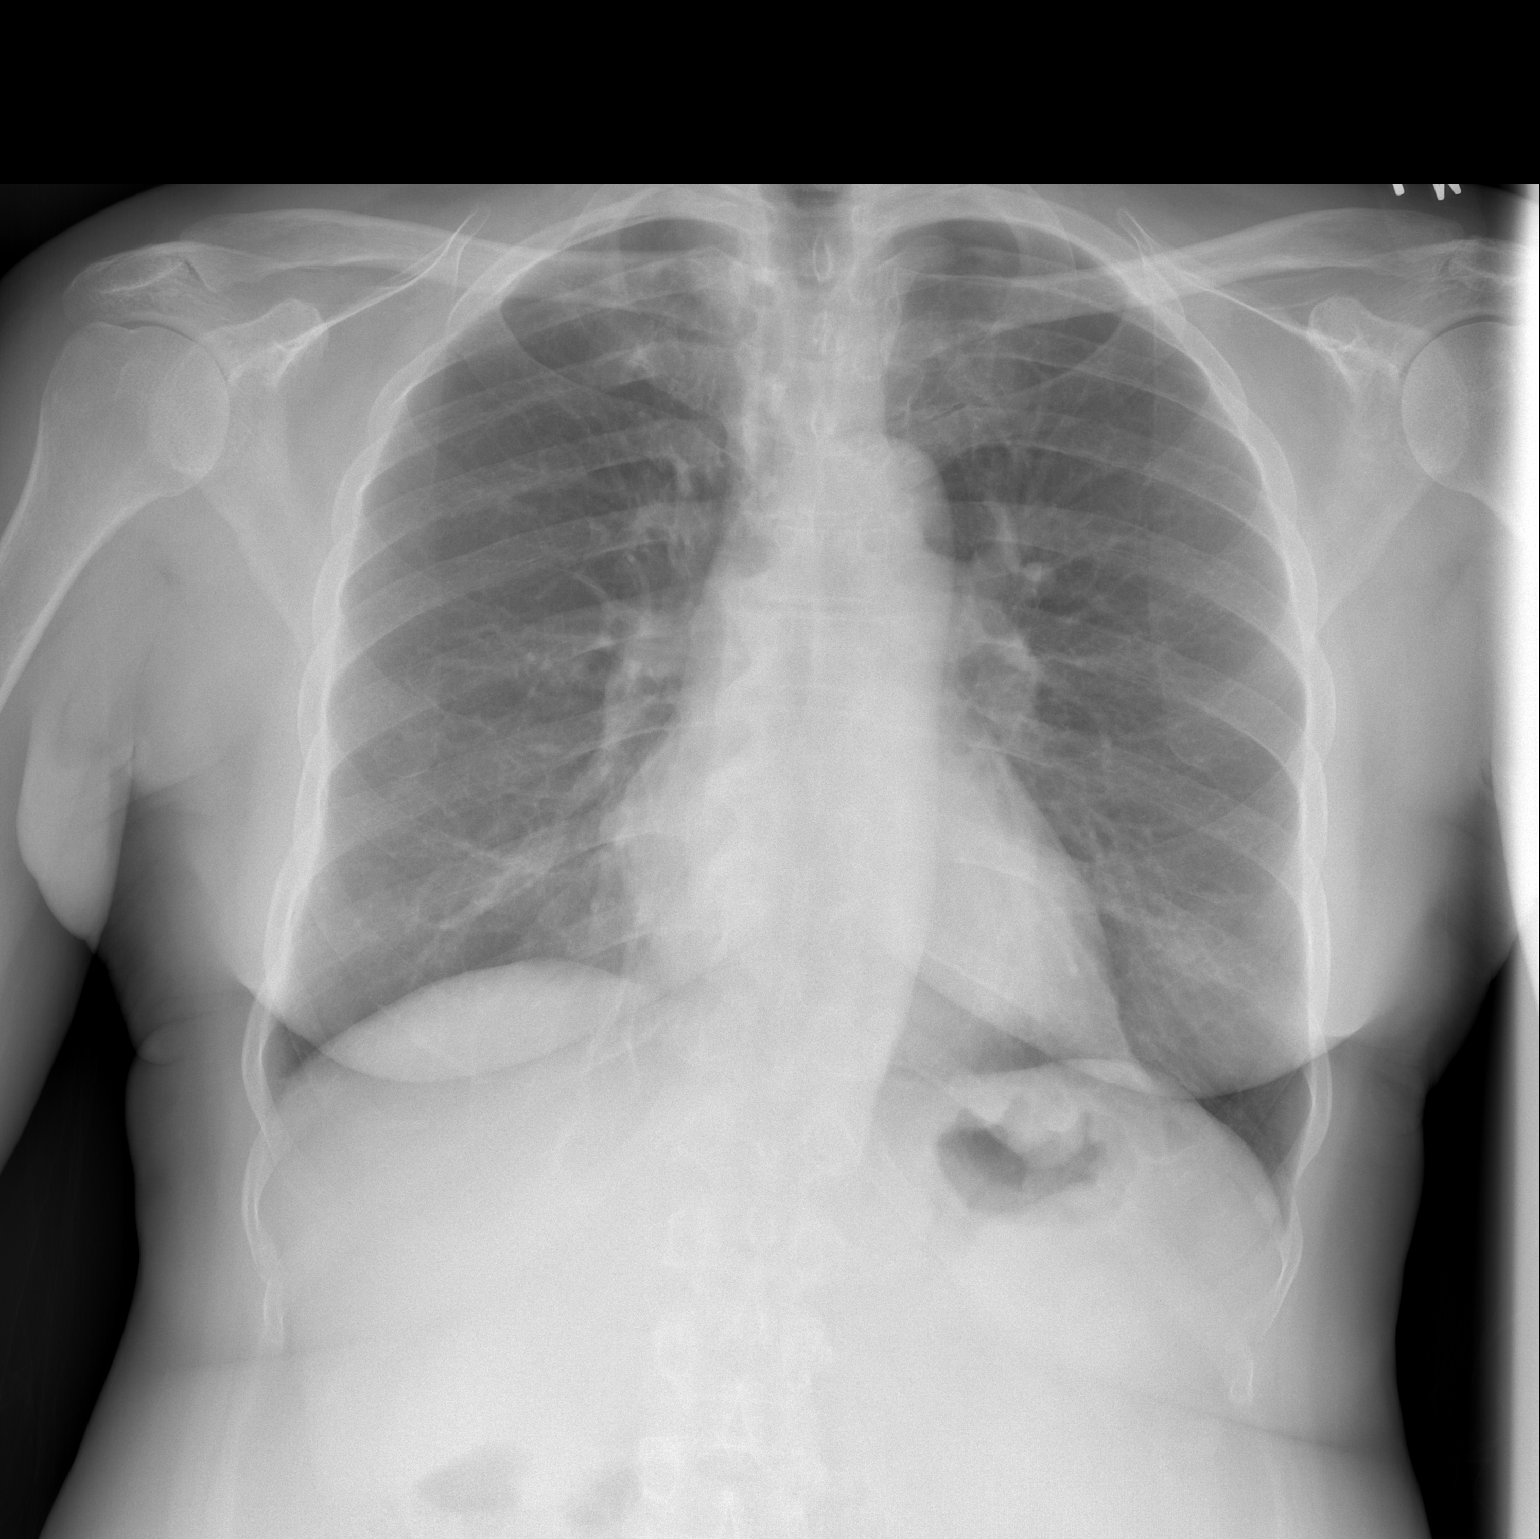

[w chest lat]
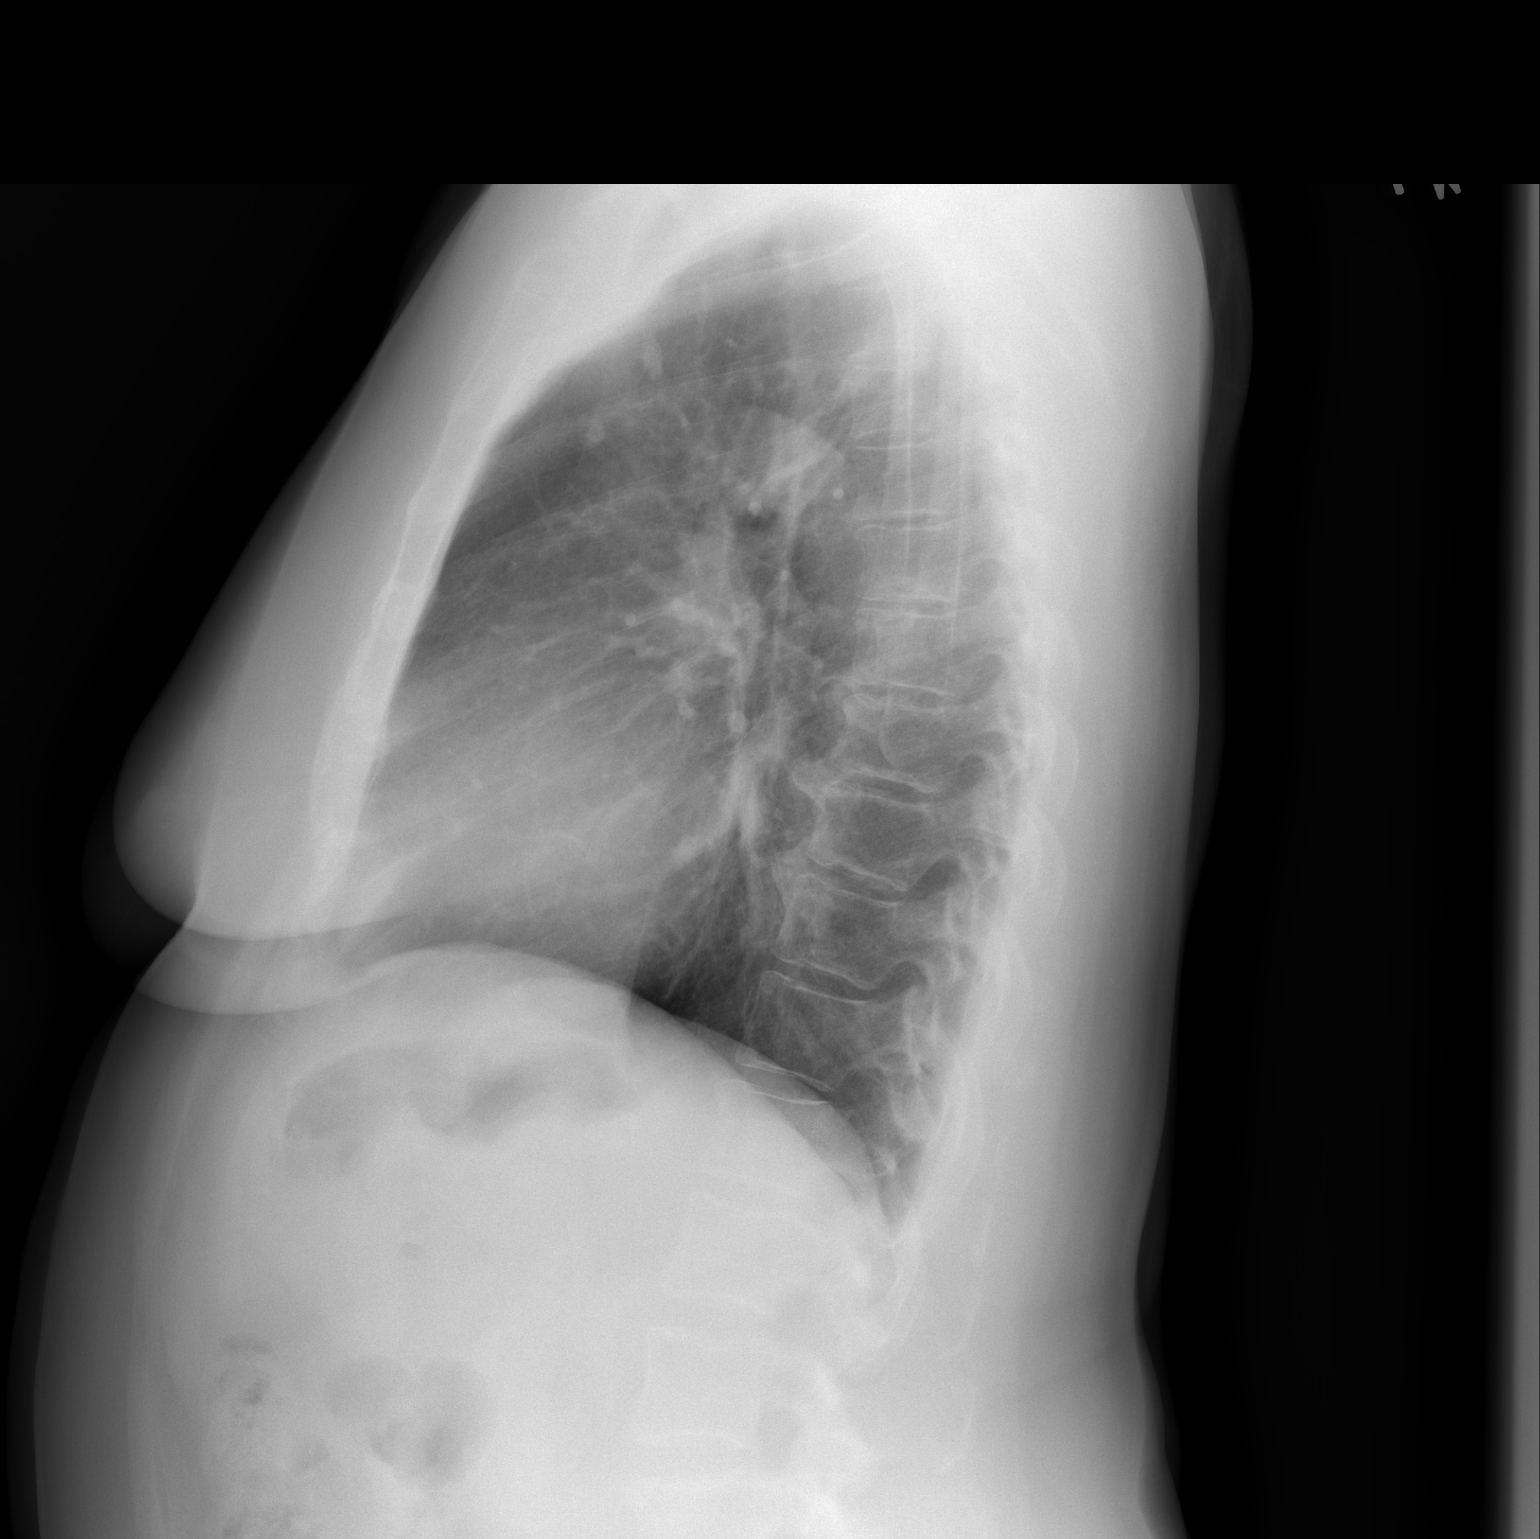

[2 of 2 positions shown; findings below may reference images not displayed]

FINDINGS: Cardiomediastinal silhouette is unremarkable.  No acute
infiltrate or pleural effusion.  No pulmonary edema.  Mild
degenerative changes thoracic spine.
IMPRESSION: No active disease.  Mild degenerative changes thoracic spine.

## 2014-09-10 ENCOUNTER — Ambulatory Visit (INDEPENDENT_AMBULATORY_CARE_PROVIDER_SITE_OTHER): Payer: Medicare Other | Admitting: Gynecology

## 2014-09-10 ENCOUNTER — Encounter: Payer: Self-pay | Admitting: Gynecology

## 2014-09-10 VITALS — BP 138/82

## 2014-09-10 DIAGNOSIS — Z09 Encounter for follow-up examination after completed treatment for conditions other than malignant neoplasm: Secondary | ICD-10-CM

## 2014-09-10 NOTE — Progress Notes (Signed)
   Patient presents to the office today for final postop visit. On 07/22/2014 patient underwent total abdominal hysterectomy with bilateral salpingo-oophorectomy and lysis of pelvic adhesion as a result of cervical intraepithelial neoplasia III. Her history had indicated that in August 7 of 2014 patient had beencomplaining at that time a postcoital bleeding when she became sexually active. She had not had a gynecological exam or Pap smear 4 years prior to that. She had reported normal Pap smears prior to that incident. Her Pap smear demonstrated the following:  Diagnosis  SQUAMOUS CELL CARCINOMA.  Patient was then referred to GYN oncologist Dr. Charlaine Dalton. He subsequently did cervical biopsies and eventually took the patient to the operating room September 2 and did a cold knife cervical conization in 2014 with pathology report as follows:  Diagnosis  Cervix, cone  - LOW TO HIGH GRADE SQUAMOUS INTRAEPITHELIAL LESION, CIN I-III (MILD TO SEVERE  DYSPLASIA/CIS) WITH ENDOCERVICAL GLANDULAR EXTENSION, SEE COMMENT.  - HIGH GRADE DYSPLASIA PRESENT AT ECTOCERVICAL MARGIN.  - ENDOCERVICAL MARGIN, NEGATIVE FOR DYSPLASIA.  Microscopic Comment  There is extensive dysplasia present demonstrating a range of grade from mild to severe squamous  dysplasia/in situ carcinoma. The ectocervical margin is involved by high grade dysplasia at the 3 to 6 o'clock  and 9 to 12 o'clock aspect of the tissue. The findings correlate with the previous Pap test results  The patient was last seen by him of October 2014 and he had recommended abdominal hysterectomy with bilateral salpingo-oophorectomy but patient refused at the present time.   Her Pap smear October October 7 demonstrated the following: Diagnosis LOW GRADE SQUAMOUS INTRAEPITHELIAL LESION: CIN-1/ HPV (LSIL). RECOMMENDATION THERE ARE A FEW CELLS SUGGESTIVE OF A HIGHER GRADE LESION. CLINICAL CORRELATION IS RECOMMENDED.  On 05/14/2014  patient underwent a detail colposcopic evaluation with pathology report as follows:  Diagnosis 1. Cervix, biopsy, 12 o'clock - HYPER AND PARAKERATOTIC SQUAMOUS MUCOSA WITH LOW GRADE SQUAMOUS INTRAEPITHELIAL LESION, CIN-I (MILD DYSPLASIA). - TRANSFORMATION ZONE MUCOSA IS NOT PRESENT. 2. Cervix, biopsy, 9 o'clock - HYPER AND PARAKERATOTIC SQUAMOUS MUCOSA WITH LOW GRADE SQUAMOUS INTRAEPITHELIAL LESION, CIN-I (MILD DYSPLASIA). - TRANSFORMATION ZONE MUCOSA IS NOT PRESENT. 3. Cervix, biopsy, 3 o'clock - SQUAMOUS MUCOSA WITH LOW GRADE SQUAMOUS INTRAEPITHELIAL LESION, CIN-I (MILD DYSPLASIA). - TRANSFORMATION ZONE MUCOSA IS NOT PRESENT.  In October of this past year patient had an ultrasound because she was feeling bloated and the ultrasound demonstrated the following:  Uterus measured 5.5 x 4.2 x 3.5 cm with endometrial stripe of 2.6 mm. Patient had one isolated small posterior fibroid measured 17 x 18 x 16 mm. Right ovary was atrophic left ovary not seen. Endometrial stripe was 2.6 mm. Excess of bowel shadowing was noted in the left adnexa. No fluid in the cul-de-sac.  Case had been discussed with her GYN oncologist Dr. Charlaine Dalton who had recommended to proceed with total abdominal hysterectomy with bilateral salpingo-oophorectomy as a result of her narrow vagina and friable cervix.  Patient is doing well and is asymptomatic today.  Exam: Blood pressure 138/82 Abdomen: Soft nontender no rebound or guarding Pfannenstiel incision scar completely healed Pelvic: Bartholin urethra Skene glands within normal limits Vagina: Atrophic changes evident Vaginal cuff: Intact Rectal exam: Not done  Assessment/plan: Patient status post total abdominal hysterectomy with bilateral salpingo-oophorectomy secondary to high-grade dysplasia with negative margins on final pathology report doing well. Patient may resume full normal activity and return back in one year for follow-up Pap smear.

## 2014-10-02 DIAGNOSIS — H4011X2 Primary open-angle glaucoma, moderate stage: Secondary | ICD-10-CM | POA: Diagnosis not present

## 2014-10-02 DIAGNOSIS — H2511 Age-related nuclear cataract, right eye: Secondary | ICD-10-CM | POA: Diagnosis not present

## 2014-10-02 DIAGNOSIS — Z961 Presence of intraocular lens: Secondary | ICD-10-CM | POA: Diagnosis not present

## 2014-12-05 ENCOUNTER — Other Ambulatory Visit: Payer: Self-pay | Admitting: Diagnostic Neuroimaging

## 2014-12-05 MED ORDER — TRIHEXYPHENIDYL HCL 2 MG PO TABS: 3.0000 mg | ORAL_TABLET | Freq: Three times a day (TID) | ORAL | Status: DC

## 2014-12-09 DIAGNOSIS — Z961 Presence of intraocular lens: Secondary | ICD-10-CM | POA: Diagnosis not present

## 2014-12-09 DIAGNOSIS — H2511 Age-related nuclear cataract, right eye: Secondary | ICD-10-CM | POA: Diagnosis not present

## 2014-12-09 DIAGNOSIS — H4011X1 Primary open-angle glaucoma, mild stage: Secondary | ICD-10-CM | POA: Diagnosis not present

## 2014-12-09 DIAGNOSIS — H4011X3 Primary open-angle glaucoma, severe stage: Secondary | ICD-10-CM | POA: Diagnosis not present

## 2015-01-22 ENCOUNTER — Encounter: Payer: Self-pay | Admitting: Nurse Practitioner

## 2015-01-22 ENCOUNTER — Ambulatory Visit (INDEPENDENT_AMBULATORY_CARE_PROVIDER_SITE_OTHER): Payer: Medicare Other | Admitting: Nurse Practitioner

## 2015-01-22 VITALS — BP 141/69 | HR 55 | Ht 66.5 in | Wt 168.0 lb

## 2015-01-22 DIAGNOSIS — F959 Tic disorder, unspecified: Secondary | ICD-10-CM | POA: Diagnosis not present

## 2015-01-22 DIAGNOSIS — R259 Unspecified abnormal involuntary movements: Secondary | ICD-10-CM

## 2015-01-22 MED ORDER — TRIHEXYPHENIDYL HCL 2 MG PO TABS: 3.0000 mg | ORAL_TABLET | Freq: Three times a day (TID) | ORAL | Status: DC

## 2015-01-22 NOTE — Patient Instructions (Signed)
Continue Artane at current dose will refill  follow-up yearly and when necessary Next visit with Dr. Krista Blue

## 2015-01-22 NOTE — Progress Notes (Signed)
[facility]  PATIENT: HALYN Pratt DOB: 1940-09-03   REASON FOR VISIT: Follow-up for facial tics, blepharospasm  HISTORY FROM: Patient    HISTORY OF PRESENT ILLNESS:Ms. Laurie Pratt, 74 year old female returns for followup. She was last seen 01/30/2014. She has a history of Meige's syndrome presenting primarily with blepharospasm and facial dyskinesias. She has been followed here since 2001 by Dr. Doy Mince and now Dr. Krista Blue. She had probable cervical dystonia in the past but that has not been a recent problem. She underwent Botox injections in July 2004 and May 2007 for blepharospasm without significant improvement.  She also takes Artane 2mg  one and 1/2 tab tid, which is beneficial. Her symptoms have been stable since last seen. She denies any side effects to her medication. On exam today she has  frequent pursing and smiling movements of the lips, good voluntary movement in the face tongue and palate. She denies gait difficulties she returns for reevaluation.    REVIEW OF SYSTEMS: Full 14 system review of systems performed and notable only for those listed, all others are neg:  Constitutional: neg  Cardiovascular: neg Ear/Nose/Throat: neg  Skin: neg Eyes: neg Respiratory: neg Gastroitestinal: neg  Hematology/Lymphatic: neg  Endocrine: neg Musculoskeletal:neg Allergy/Immunology: neg Neurological: neg Psychiatric: neg Sleep : neg   ALLERGIES: Allergies  Allergen Reactions  . Ace Inhibitors   . Doxazosin Mesylate   . Verapamil     REACTION: legs swelling, tired    HOME MEDICATIONS: Outpatient Prescriptions Prior to Visit  Medication Sig Dispense Refill  . atenolol-chlorthalidone (TENORETIC) 100-25 MG per tablet take 1 tablet by mouth every morning 90 tablet 3  . losartan (COZAAR) 100 MG tablet take 1 tablet by mouth once daily 90 tablet 3  . trihexyphenidyl (ARTANE) 2 MG tablet Take 1.5 tablets (3 mg total) by mouth 3 (three) times daily. 150 tablet  3   No facility-administered medications prior to visit.    PAST MEDICAL HISTORY: Past Medical History  Diagnosis Date  . Hypertension   . Tics of organic origin     Dr. Doy Mince  . Hyperlipidemia     diet controlled - no meds  . CIN III (cervical intraepithelial neoplasia grade III) with severe dysplasia   . SVD (spontaneous vaginal delivery)     x 3  . H/O colonoscopy   . Neuromuscular disorder     PAST SURGICAL HISTORY: Past Surgical History  Procedure Laterality Date  . Eye surgery      left eye  . Dilation and curettage of uterus    . Cervical conization w/bx N/A 03/12/2013    Procedure: COLD KNIFE CONIZATION CERVIX WITH BIOPSY/EXAM UNDER ANESTHESIA, RECTAL EXAM;  Surgeon: Alvino Chapel, MD;  Location: [facility];  Service: Gynecology;  Laterality: N/A;  . Cervical conization w/bx N/A 03/12/2013    Procedure: CONIZATION CERVIX WITH BIOPSY;  Surgeon: Alvino Chapel, MD;  Location: [facility];  Service: Gynecology;  Laterality: N/A;  patient returned to OR for fulgeration to stop bleeding  . Tubal ligation    . Salpingoophorectomy Bilateral 07/22/2014    Procedure: BILATERAL SALPINGO OOPHORECTOMY;  Surgeon: Terrance Mass, MD;  Location: [facility];  Service: Gynecology;  Laterality: Bilateral;  . Abdominal hysterectomy N/A 07/22/2014    Procedure: HYSTERECTOMY ABDOMINAL;  Surgeon: Terrance Mass, MD;  Location: [facility];  Service: Gynecology;  Laterality: N/A;    FAMILY HISTORY: Family History  Problem Relation Age of Onset  . Hypertension Other   .  Hypertension Mother   . Diabetes Father   . Hypertension Father   . Heart disease Maternal Aunt     CHF    SOCIAL HISTORY: History   Social History  . Marital Status: Widowed    Spouse Name: N/A  . Number of Children: 2  . Years of Education: 12   Occupational History  . retired     Social History Main Topics  . Smoking status: Never Smoker   . Smokeless tobacco:  Never Used  . Alcohol Use: No  . Drug Use: No  . Sexual Activity: Yes    Birth Control/ Protection: Surgical   Other Topics Concern  . Not on file   Social History Narrative   Patient is widowed with 2 children   Patient is right handed   Patient has a high school education   Patient drinks 1 cup daily     PHYSICAL EXAM  Filed Vitals:   01/22/15 1045  BP: 141/69  Pulse: 55  Height: 5' 6.5" (1.689 m)  Weight: 168 lb (76.204 kg)   Body mass index is 26.71 kg/(m^2). General: well developed, well nourished, seated, in no evident distress Head: head normocephalic and atraumatic. Oropharynx benign. Neck: supple with no carotid bruits. Neurologic Exam  Mental Status: Awake and fully alert. Oriented to place and time. Follows all commands. Mood and affect appropriate. Cranial Nerves: Pupils equal, briskly reactive to light. Extraocular movements full without nystagmus. Visual fields full to confrontation. Hearing intact and symmetric to finger snap. Facial sensation intact. Face, tongue, palate move normally and symmetrically. Neck flexion and extension normal. Infrequent pursing and smiling movements of the lips. Rare blepharospasm noted.  Motor: Normal bulk and tone. Normal strength in all tested extremity muscles. Sensory: intact to light touch. Coordination: Rapid alternating movements normal in all extremities. Finger-to-nose and heel-to-shin performed accurately bilaterally. Gait and Station: Arises from chair without difficulty. Stance is normal. Tandem gait is normal Reflexes: 1+ and symmetric. Toes downgoing.   DIAGNOSTIC DATA (LABS, IMAGING, TESTING) - I reviewed patient records, labs, notes, testing and imaging myself where available.  Lab Results  Component Value Date   WBC 10.0 07/23/2014   HGB 11.9* 07/23/2014   HCT 35.7* 07/23/2014   MCV 86.9 07/23/2014   PLT 252 07/23/2014      Component Value Date/Time   NA 138 07/23/2014 0525   K 4.8  07/23/2014 0525   CL 102 07/23/2014 0525   CO2 26 07/23/2014 0525   GLUCOSE 106* 07/23/2014 0525   BUN 13 07/23/2014 0525   CREATININE 1.18* 07/23/2014 0525   CALCIUM 9.2 07/23/2014 0525   PROT 7.6 06/09/2014 1533   ALBUMIN 3.8 06/09/2014 1533   AST 21 06/09/2014 1533   ALT 17 06/09/2014 1533   ALKPHOS 74 06/09/2014 1533   BILITOT 0.4 06/09/2014 1533   GFRNONAA 45* 07/23/2014 0525   GFRAA 52* 07/23/2014 0525    ASSESSMENT AND PLAN  74 y.o. year old female  has a past medical history of Hypertension; Tics of organic origin; blepharospasm  here to follow-up.  Continue Artane at current dose will refill  follow-up yearly and when necessary Next visit with Dr. Luan Pulling, [facility], [facility], [facility] [address], [place], [place] [address] [phone]

## 2015-02-02 ENCOUNTER — Ambulatory Visit: Payer: Medicare Other | Admitting: Nurse Practitioner

## 2015-02-02 NOTE — Progress Notes (Signed)
I have reviewed and agreed above plan. 

## 2015-02-23 ENCOUNTER — Encounter (HOSPITAL_COMMUNITY): Payer: Self-pay | Admitting: *Deleted

## 2015-02-23 ENCOUNTER — Emergency Department (INDEPENDENT_AMBULATORY_CARE_PROVIDER_SITE_OTHER)
Admission: EM | Admit: 2015-02-23 | Discharge: 2015-02-23 | Disposition: A | Discharge status: Home / Self Care | Payer: Medicare Other | Source: Home / Self Care | Attending: Family Medicine | Admitting: Family Medicine

## 2015-02-23 DIAGNOSIS — T148 Other injury of unspecified body region: Secondary | ICD-10-CM

## 2015-02-23 DIAGNOSIS — W57XXXA Bitten or stung by nonvenomous insect and other nonvenomous arthropods, initial encounter: Secondary | ICD-10-CM | POA: Diagnosis not present

## 2015-02-23 MED ORDER — PERMETHRIN 5 % EX CREA: TOPICAL_CREAM | CUTANEOUS | Status: DC

## 2015-02-23 MED ORDER — HYDROXYZINE HCL 25 MG PO TABS: 25.0000 mg | ORAL_TABLET | Freq: Four times a day (QID) | ORAL | Status: DC

## 2015-02-23 NOTE — ED Notes (Addendum)
Pt      Reports  Large  Red   Bumps         Over  abd  And   Back   As  Well  As  Her  Legs         The   Rash itches    The    Patient  denys  Any  Chest pain or  Shortness  Of  Breath   She  Is   Awake  And  Alert  And  Oriented

## 2015-02-23 NOTE — ED Provider Notes (Signed)
CSN: [id number]     Arrival date & time 02/23/15  1414 History   First MD Initiated Contact with Patient 02/23/15 1615     Chief Complaint  Patient presents with  . Rash   (Consider location/radiation/quality/duration/timing/severity/associated sxs/prior Treatment) Patient is a 74 y.o. female presenting with rash. The history is provided by the patient.  Rash Location:  Full body Quality: itchiness, redness and swelling   Severity:  Moderate Onset quality:  Gradual Duration:  2 weeks Progression:  Spreading Chronicity:  New Context: insect bite/sting   Ineffective treatments:  OTC analgesics Associated symptoms: no fever, no joint pain and no nausea     Past Medical History  Diagnosis Date  . Hypertension   . Tics of organic origin     Dr. Doy Mince  . Hyperlipidemia     diet controlled - no meds  . CIN III (cervical intraepithelial neoplasia grade III) with severe dysplasia   . SVD (spontaneous vaginal delivery)     x 3  . H/O colonoscopy   . Neuromuscular disorder    Past Surgical History  Procedure Laterality Date  . Eye surgery      left eye  . Dilation and curettage of uterus    . Cervical conization w/bx N/A 03/12/2013    Procedure: COLD KNIFE CONIZATION CERVIX WITH BIOPSY/EXAM UNDER ANESTHESIA, RECTAL EXAM;  Surgeon: Alvino Chapel, MD;  Location: [facility];  Service: Gynecology;  Laterality: N/A;  . Cervical conization w/bx N/A 03/12/2013    Procedure: CONIZATION CERVIX WITH BIOPSY;  Surgeon: Alvino Chapel, MD;  Location: [facility];  Service: Gynecology;  Laterality: N/A;  patient returned to OR for fulgeration to stop bleeding  . Tubal ligation    . Salpingoophorectomy Bilateral 07/22/2014    Procedure: BILATERAL SALPINGO OOPHORECTOMY;  Surgeon: Terrance Mass, MD;  Location: [facility];  Service: Gynecology;  Laterality: Bilateral;  . Abdominal hysterectomy N/A 07/22/2014    Procedure: HYSTERECTOMY ABDOMINAL;   Surgeon: Terrance Mass, MD;  Location: [facility];  Service: Gynecology;  Laterality: N/A;   Family History  Problem Relation Age of Onset  . Hypertension Other   . Hypertension Mother   . Diabetes Father   . Hypertension Father   . Heart disease Maternal Aunt     CHF   Social History  Substance Use Topics  . Smoking status: Never Smoker   . Smokeless tobacco: Never Used  . Alcohol Use: No   OB History    Gravida Para Term Preterm AB TAB SAB Ectopic Multiple Living   4 3   1  1   3      Review of Systems  Constitutional: Negative.  Negative for fever.  Gastrointestinal: Negative for nausea.  Musculoskeletal: Negative for arthralgias.  Skin: Positive for rash.    Allergies  Ace inhibitors; Doxazosin mesylate; and Verapamil  Home Medications   Prior to Admission medications   Medication Sig Start Date End Date Taking? Authorizing Provider  atenolol-chlorthalidone (TENORETIC) 100-25 MG per tablet take 1 tablet by mouth every morning 04/07/14   Lew Dawes V, MD  hydrOXYzine (ATARAX/VISTARIL) 25 MG tablet Take 1 tablet (25 mg total) by mouth every 6 (six) hours. For itching 02/23/15   Billy Fischer, MD  losartan (COZAAR) 100 MG tablet take 1 tablet by mouth once daily    Aleksei Plotnikov V, MD  permethrin (ELIMITE) 5 % cream Apply as instructed tonight and repeat in 1 week. 02/23/15   Nelda Severe  Kindl, MD  trihexyphenidyl (ARTANE) 2 MG tablet Take 1.5 tablets (3 mg total) by mouth 3 (three) times daily. 01/22/15   Dennie Bible, NP   BP 169/84 mmHg  Pulse 58  Temp(Src) 98.4 F (36.9 C) (Oral)  Resp 18  SpO2 97% Physical Exam  Constitutional: She is oriented to person, place, and time. She appears well-developed and well-nourished. She appears distressed.  Lymphadenopathy:    She has no cervical adenopathy.  Neurological: She is alert and oriented to person, place, and time.  Skin: Skin is warm and dry. Rash noted.  Mult raised whelp type scattered on back, s/p  lesions on ext.  Nursing note and vitals reviewed.   ED Course  Procedures (including critical care time) Labs Review Labs Reviewed - No data to display  Imaging Review No results found.   MDM   1. Multiple insect bites        Billy Fischer, MD 02/25/15 1534

## 2015-02-25 ENCOUNTER — Ambulatory Visit: Payer: Medicare Other | Admitting: Internal Medicine

## 2015-03-05 ENCOUNTER — Other Ambulatory Visit: Payer: Self-pay | Admitting: Internal Medicine

## 2015-03-13 ENCOUNTER — Encounter: Payer: Self-pay | Admitting: Internal Medicine

## 2015-04-09 ENCOUNTER — Other Ambulatory Visit: Payer: Self-pay | Admitting: Internal Medicine

## 2015-04-09 DIAGNOSIS — H4011X1 Primary open-angle glaucoma, mild stage: Secondary | ICD-10-CM | POA: Diagnosis not present

## 2015-04-09 DIAGNOSIS — H4011X3 Primary open-angle glaucoma, severe stage: Secondary | ICD-10-CM | POA: Diagnosis not present

## 2015-04-09 DIAGNOSIS — H2511 Age-related nuclear cataract, right eye: Secondary | ICD-10-CM | POA: Diagnosis not present

## 2015-04-09 DIAGNOSIS — Z961 Presence of intraocular lens: Secondary | ICD-10-CM | POA: Diagnosis not present

## 2015-05-07 ENCOUNTER — Other Ambulatory Visit: Payer: Self-pay | Admitting: Internal Medicine

## 2015-06-05 ENCOUNTER — Other Ambulatory Visit: Payer: Self-pay | Admitting: Internal Medicine

## 2015-06-10 DIAGNOSIS — H401123 Primary open-angle glaucoma, left eye, severe stage: Secondary | ICD-10-CM | POA: Diagnosis not present

## 2015-06-10 DIAGNOSIS — H2511 Age-related nuclear cataract, right eye: Secondary | ICD-10-CM | POA: Diagnosis not present

## 2015-06-10 DIAGNOSIS — H401111 Primary open-angle glaucoma, right eye, mild stage: Secondary | ICD-10-CM | POA: Diagnosis not present

## 2015-06-10 DIAGNOSIS — Z961 Presence of intraocular lens: Secondary | ICD-10-CM | POA: Diagnosis not present

## 2015-07-03 ENCOUNTER — Telehealth: Payer: Self-pay | Admitting: *Deleted

## 2015-07-03 ENCOUNTER — Other Ambulatory Visit: Payer: Self-pay | Admitting: Internal Medicine

## 2015-07-03 NOTE — Telephone Encounter (Signed)
Rec Rf requests for patient's hypertension meds. Last OV was 05/2014. I called pt to schedule OV and refilled meds x 15 days.See meds.  OV scheduled for 07/08/15 @ 3:15 pm. Pt informed

## 2015-07-08 ENCOUNTER — Encounter: Payer: Self-pay | Admitting: Internal Medicine

## 2015-07-08 ENCOUNTER — Other Ambulatory Visit (INDEPENDENT_AMBULATORY_CARE_PROVIDER_SITE_OTHER): Payer: Medicare Other

## 2015-07-08 ENCOUNTER — Ambulatory Visit (INDEPENDENT_AMBULATORY_CARE_PROVIDER_SITE_OTHER): Payer: Medicare Other | Admitting: Internal Medicine

## 2015-07-08 VITALS — BP 118/70 | HR 62 | Ht 67.0 in | Wt 175.0 lb

## 2015-07-08 DIAGNOSIS — Z0000 Encounter for general adult medical examination without abnormal findings: Secondary | ICD-10-CM | POA: Diagnosis not present

## 2015-07-08 DIAGNOSIS — I10 Essential (primary) hypertension: Secondary | ICD-10-CM

## 2015-07-08 DIAGNOSIS — F959 Tic disorder, unspecified: Secondary | ICD-10-CM

## 2015-07-08 DIAGNOSIS — E785 Hyperlipidemia, unspecified: Secondary | ICD-10-CM | POA: Diagnosis not present

## 2015-07-08 LAB — CBC WITH DIFFERENTIAL/PLATELET
BASOS ABS: 0 10*3/uL (ref 0.0–0.1)
Basophils Relative: 0.4 % (ref 0.0–3.0)
EOS ABS: 0.1 10*3/uL (ref 0.0–0.7)
Eosinophils Relative: 2.2 % (ref 0.0–5.0)
HEMATOCRIT: 42 % (ref 36.0–46.0)
HEMOGLOBIN: 13.9 g/dL (ref 12.0–15.0)
LYMPHS PCT: 30.8 % (ref 12.0–46.0)
Lymphs Abs: 2 10*3/uL (ref 0.7–4.0)
MCHC: 33 g/dL (ref 30.0–36.0)
MCV: 87.6 fl (ref 78.0–100.0)
MONO ABS: 0.6 10*3/uL (ref 0.1–1.0)
Monocytes Relative: 8.7 % (ref 3.0–12.0)
Neutro Abs: 3.9 10*3/uL (ref 1.4–7.7)
Neutrophils Relative %: 57.9 % (ref 43.0–77.0)
Platelets: 332 10*3/uL (ref 150.0–400.0)
RBC: 4.8 Mil/uL (ref 3.87–5.11)
RDW: 13.6 % (ref 11.5–15.5)
WBC: 6.7 10*3/uL (ref 4.0–10.5)

## 2015-07-08 LAB — BASIC METABOLIC PANEL
BUN: 22 mg/dL (ref 6–23)
CHLORIDE: 104 meq/L (ref 96–112)
CO2: 28 mEq/L (ref 19–32)
Calcium: 10.3 mg/dL (ref 8.4–10.5)
Creatinine, Ser: 1.1 mg/dL (ref 0.40–1.20)
GFR: 62.79 mL/min (ref >60.00)
GLUCOSE: 95 mg/dL (ref 70–99)
POTASSIUM: 3.8 meq/L (ref 3.5–5.1)
SODIUM: 140 meq/L (ref 135–145)

## 2015-07-08 LAB — HEPATIC FUNCTION PANEL
ALBUMIN: 4 g/dL (ref 3.5–5.2)
ALK PHOS: 85 U/L (ref 39–117)
ALT: 16 U/L (ref 0–35)
AST: 19 U/L (ref 0–37)
Bilirubin, Direct: 0 mg/dL (ref 0.0–0.3)
Total Bilirubin: 0.4 mg/dL (ref 0.2–1.2)
Total Protein: 7.9 g/dL (ref 6.0–8.3)

## 2015-07-08 LAB — URINALYSIS, ROUTINE W REFLEX MICROSCOPIC
Bilirubin Urine: NEGATIVE
HGB URINE DIPSTICK: NEGATIVE
Ketones, ur: NEGATIVE
NITRITE: NEGATIVE
RBC / HPF: NONE SEEN (ref 0–?)
SPECIFIC GRAVITY, URINE: 1.015 (ref 1.000–1.030)
TOTAL PROTEIN, URINE-UPE24: NEGATIVE
Urine Glucose: NEGATIVE
Urobilinogen, UA: 0.2 (ref 0.0–1.0)
pH: 6.5 (ref 5.0–8.0)

## 2015-07-08 LAB — LIPID PANEL
CHOL/HDL RATIO: 4
Cholesterol: 231 mg/dL — ABNORMAL HIGH (ref 0–200)
HDL: 53.4 mg/dL (ref >39.00)
NONHDL: 177.56
Triglycerides: 246 mg/dL — ABNORMAL HIGH (ref 0.0–149.0)
VLDL: 49.2 mg/dL — AB (ref 0.0–40.0)

## 2015-07-08 LAB — TSH: TSH: 1.83 u[IU]/mL (ref 0.35–4.50)

## 2015-07-08 LAB — LDL CHOLESTEROL, DIRECT: LDL DIRECT: 134 mg/dL

## 2015-07-08 MED ORDER — ATENOLOL-CHLORTHALIDONE 100-25 MG PO TABS: 1.0000 | ORAL_TABLET | Freq: Every morning | ORAL | Status: DC

## 2015-07-08 MED ORDER — HYDROXYZINE HCL 25 MG PO TABS: 25.0000 mg | ORAL_TABLET | Freq: Four times a day (QID) | ORAL | Status: DC

## 2015-07-08 MED ORDER — LOSARTAN POTASSIUM 100 MG PO TABS: 100.0000 mg | ORAL_TABLET | Freq: Every day | ORAL | Status: DC

## 2015-07-08 NOTE — Assessment & Plan Note (Signed)
On Artane 

## 2015-07-08 NOTE — Assessment & Plan Note (Addendum)
Here for medicare wellness/physical  Diet: heart healthy  Physical activity: not sedentary  Depression/mood screen: negative  Hearing: intact to whispered voice  Visual acuity: grossly normal, performs annual eye exam  ADLs: capable  Fall risk: low to none  Home safety: good  Cognitive evaluation: intact to orientation, naming, recall and repetition  EOL planning: adv directives, full code/ I agree  I have personally reviewed and have noted  1. The patient's medical, surgical and social history  2. Their use of alcohol, tobacco or illicit drugs  3. Their current medications and supplements  4. The patient's functional ability including ADL's, fall risks, home safety risks and hearing or visual impairment.  5. Diet and physical activities  6. Evidence for depression or mood disorders 7. The roster of all physicians providing medical care to patient - is listed in the Snapshot section of the chart and reviewed today.    Today patient counseled on age appropriate routine health concerns for screening and prevention, each reviewed and up to date or declined. Immunizations reviewed and up to date or declined. Labs ordered and reviewed. Risk factors for depression reviewed and negative. Hearing function and visual acuity are intact. ADLs screened and addressed as needed. Functional ability and level of safety reviewed and appropriate. Education, counseling and referrals performed based on assessed risks today. Patient provided with a copy of personalized plan for preventive services.  Labs Colon due 2020  Declined all shots

## 2015-07-08 NOTE — Patient Instructions (Signed)
Preventive Care for Adults, Female A healthy lifestyle and preventive care can promote health and wellness. Preventive health guidelines for women include the following key practices.  A routine yearly physical is a good way to check with your health care provider about your health and preventive screening. It is a chance to share any concerns and updates on your health and to receive a thorough exam.  Visit your dentist for a routine exam and preventive care every 6 months. Brush your teeth twice a day and floss once a day. Good oral hygiene prevents tooth decay and gum disease.  The frequency of eye exams is based on your age, health, family medical history, use of contact lenses, and other factors. Follow your health care provider's recommendations for frequency of eye exams.  Eat a healthy diet. Foods like vegetables, fruits, whole grains, low-fat dairy products, and lean protein foods contain the nutrients you need without too many calories. Decrease your intake of foods high in solid fats, added sugars, and salt. Eat the right amount of calories for you.Get information about a proper diet from your health care provider, if necessary.  Regular physical exercise is one of the most important things you can do for your health. Most adults should get at least 150 minutes of moderate-intensity exercise (any activity that increases your heart rate and causes you to sweat) each week. In addition, most adults need muscle-strengthening exercises on 2 or more days a week.  Maintain a healthy weight. The body mass index (BMI) is a screening tool to identify possible weight problems. It provides an estimate of body fat based on height and weight. Your health care provider can find your BMI and can help you achieve or maintain a healthy weight.For adults 20 years and older:  A BMI below 18.5 is considered underweight.  A BMI of 18.5 to 24.9 is normal.  A BMI of 25 to 29.9 is considered overweight.  A  BMI of 30 and above is considered obese.  Maintain normal blood lipids and cholesterol levels by exercising and minimizing your intake of saturated fat. Eat a balanced diet with plenty of fruit and vegetables. Blood tests for lipids and cholesterol should begin at age 107 and be repeated every 5 years. If your lipid or cholesterol levels are high, you are over 50, or you are at high risk for heart disease, you may need your cholesterol levels checked more frequently.Ongoing high lipid and cholesterol levels should be treated with medicines if diet and exercise are not working.  If you smoke, find out from your health care provider how to quit. If you do not use tobacco, do not start.  Lung cancer screening is recommended for adults aged 84-80 years who are at high risk for developing lung cancer because of a history of smoking. A yearly low-dose CT scan of the lungs is recommended for people who have at least a 30-pack-year history of smoking and are a current smoker or have quit within the past 15 years. A pack year of smoking is smoking an average of 1 pack of cigarettes a day for 1 year (for example: 1 pack a day for 30 years or 2 packs a day for 15 years). Yearly screening should continue until the smoker has stopped smoking for at least 15 years. Yearly screening should be stopped for people who develop a health problem that would prevent them from having lung cancer treatment.  If you are pregnant, do not drink alcohol. If you are  breastfeeding, be very cautious about drinking alcohol. If you are not pregnant and choose to drink alcohol, do not have more than 1 drink per day. One drink is considered to be 12 ounces (355 mL) of beer, 5 ounces (148 mL) of wine, or 1.5 ounces (44 mL) of liquor.  Avoid use of street drugs. Do not share needles with anyone. Ask for help if you need support or instructions about stopping the use of drugs.  High blood pressure causes heart disease and increases the risk  of stroke. Your blood pressure should be checked at least every 1 to 2 years. Ongoing high blood pressure should be treated with medicines if weight loss and exercise do not work.  If you are 55-79 years old, ask your health care provider if you should take aspirin to prevent strokes.  Diabetes screening is done by taking a blood sample to check your blood glucose level after you have not eaten for a certain period of time (fasting). If you are not overweight and you do not have risk factors for diabetes, you should be screened once every 3 years starting at age 45. If you are overweight or obese and you are 40-70 years of age, you should be screened for diabetes every year as part of your cardiovascular risk assessment.  Breast cancer screening is essential preventive care for women. You should practice "breast self-awareness." This means understanding the normal appearance and feel of your breasts and may include breast self-examination. Any changes detected, no matter how small, should be reported to a health care provider. Women in their 20s and 30s should have a clinical breast exam (CBE) by a health care provider as part of a regular health exam every 1 to 3 years. After age 40, women should have a CBE every year. Starting at age 40, women should consider having a mammogram (breast X-ray test) every year. Women who have a family history of breast cancer should talk to their health care provider about genetic screening. Women at a high risk of breast cancer should talk to their health care providers about having an MRI and a mammogram every year.  Breast cancer gene (BRCA)-related cancer risk assessment is recommended for women who have family members with BRCA-related cancers. BRCA-related cancers include breast, ovarian, tubal, and peritoneal cancers. Having family members with these cancers may be associated with an increased risk for harmful changes (mutations) in the breast cancer genes BRCA1 and  BRCA2. Results of the assessment will determine the need for genetic counseling and BRCA1 and BRCA2 testing.  Your health care provider may recommend that you be screened regularly for cancer of the pelvic organs (ovaries, uterus, and vagina). This screening involves a pelvic examination, including checking for microscopic changes to the surface of your cervix (Pap test). You may be encouraged to have this screening done every 3 years, beginning at age 21.  For women ages 30-65, health care providers may recommend pelvic exams and Pap testing every 3 years, or they may recommend the Pap and pelvic exam, combined with testing for human papilloma virus (HPV), every 5 years. Some types of HPV increase your risk of cervical cancer. Testing for HPV may also be done on women of any age with unclear Pap test results.  Other health care providers may not recommend any screening for nonpregnant women who are considered low risk for pelvic cancer and who do not have symptoms. Ask your health care provider if a screening pelvic exam is right for   you.  If you have had past treatment for cervical cancer or a condition that could lead to cancer, you need Pap tests and screening for cancer for at least 20 years after your treatment. If Pap tests have been discontinued, your risk factors (such as having a new sexual partner) need to be reassessed to determine if screening should resume. Some women have medical problems that increase the chance of getting cervical cancer. In these cases, your health care provider may recommend more frequent screening and Pap tests.  Colorectal cancer can be detected and often prevented. Most routine colorectal cancer screening begins at the age of 50 years and continues through age 75 years. However, your health care provider may recommend screening at an earlier age if you have risk factors for colon cancer. On a yearly basis, your health care provider may provide home test kits to check  for hidden blood in the stool. Use of a small camera at the end of a tube, to directly examine the colon (sigmoidoscopy or colonoscopy), can detect the earliest forms of colorectal cancer. Talk to your health care provider about this at age 50, when routine screening begins. Direct exam of the colon should be repeated every 5-10 years through age 75 years, unless early forms of precancerous polyps or small growths are found.  People who are at an increased risk for hepatitis B should be screened for this virus. You are considered at high risk for hepatitis B if:  You were born in a country where hepatitis B occurs often. Talk with your health care provider about which countries are considered high risk.  Your parents were born in a high-risk country and you have not received a shot to protect against hepatitis B (hepatitis B vaccine).  You have HIV or AIDS.  You use needles to inject street drugs.  You live with, or have sex with, someone who has hepatitis B.  You get hemodialysis treatment.  You take certain medicines for conditions like cancer, organ transplantation, and autoimmune conditions.  Hepatitis C blood testing is recommended for all people born from 1945 through 1965 and any individual with known risks for hepatitis C.  Practice safe sex. Use condoms and avoid high-risk sexual practices to reduce the spread of sexually transmitted infections (STIs). STIs include gonorrhea, chlamydia, syphilis, trichomonas, herpes, HPV, and human immunodeficiency virus (HIV). Herpes, HIV, and HPV are viral illnesses that have no cure. They can result in disability, cancer, and death.  You should be screened for sexually transmitted illnesses (STIs) including gonorrhea and chlamydia if:  You are sexually active and are younger than 24 years.  You are older than 24 years and your health care provider tells you that you are at risk for this type of infection.  Your sexual activity has changed  since you were last screened and you are at an increased risk for chlamydia or gonorrhea. Ask your health care provider if you are at risk.  If you are at risk of being infected with HIV, it is recommended that you take a prescription medicine daily to prevent HIV infection. This is called preexposure prophylaxis (PrEP). You are considered at risk if:  You are sexually active and do not regularly use condoms or know the HIV status of your partner(s).  You take drugs by injection.  You are sexually active with a partner who has HIV.  Talk with your health care provider about whether you are at high risk of being infected with HIV. If   you choose to begin PrEP, you should first be tested for HIV. You should then be tested every 3 months for as long as you are taking PrEP.  Osteoporosis is a disease in which the bones lose minerals and strength with aging. This can result in serious bone fractures or breaks. The risk of osteoporosis can be identified using a bone density scan. Women ages 67 years and over and women at risk for fractures or osteoporosis should discuss screening with their health care providers. Ask your health care provider whether you should take a calcium supplement or vitamin D to reduce the rate of osteoporosis.  Menopause can be associated with physical symptoms and risks. Hormone replacement therapy is available to decrease symptoms and risks. You should talk to your health care provider about whether hormone replacement therapy is right for you.  Use sunscreen. Apply sunscreen liberally and repeatedly throughout the day. You should seek shade when your shadow is shorter than you. Protect yourself by wearing long sleeves, pants, a wide-brimmed hat, and sunglasses year round, whenever you are outdoors.  Once a month, do a whole body skin exam, using a mirror to look at the skin on your back. Tell your health care provider of new moles, moles that have irregular borders, moles that  are larger than a pencil eraser, or moles that have changed in shape or color.  Stay current with required vaccines (immunizations).  Influenza vaccine. All adults should be immunized every year.  Tetanus, diphtheria, and acellular pertussis (Td, Tdap) vaccine. Pregnant women should receive 1 dose of Tdap vaccine during each pregnancy. The dose should be obtained regardless of the length of time since the last dose. Immunization is preferred during the 27th-36th week of gestation. An adult who has not previously received Tdap or who does not know her vaccine status should receive 1 dose of Tdap. This initial dose should be followed by tetanus and diphtheria toxoids (Td) booster doses every 10 years. Adults with an unknown or incomplete history of completing a 3-dose immunization series with Td-containing vaccines should begin or complete a primary immunization series including a Tdap dose. Adults should receive a Td booster every 10 years.  Varicella vaccine. An adult without evidence of immunity to varicella should receive 2 doses or a second dose if she has previously received 1 dose. Pregnant females who do not have evidence of immunity should receive the first dose after pregnancy. This first dose should be obtained before leaving the health care facility. The second dose should be obtained 4-8 weeks after the first dose.  Human papillomavirus (HPV) vaccine. Females aged 13-26 years who have not received the vaccine previously should obtain the 3-dose series. The vaccine is not recommended for use in pregnant females. However, pregnancy testing is not needed before receiving a dose. If a female is found to be pregnant after receiving a dose, no treatment is needed. In that case, the remaining doses should be delayed until after the pregnancy. Immunization is recommended for any person with an immunocompromised condition through the age of 61 years if she did not get any or all doses earlier. During the  3-dose series, the second dose should be obtained 4-8 weeks after the first dose. The third dose should be obtained 24 weeks after the first dose and 16 weeks after the second dose.  Zoster vaccine. One dose is recommended for adults aged 30 years or older unless certain conditions are present.  Measles, mumps, and rubella (MMR) vaccine. Adults born  before 1957 generally are considered immune to measles and mumps. Adults born in 1957 or later should have 1 or more doses of MMR vaccine unless there is a contraindication to the vaccine or there is laboratory evidence of immunity to each of the three diseases. A routine second dose of MMR vaccine should be obtained at least 28 days after the first dose for students attending postsecondary schools, health care workers, or international travelers. People who received inactivated measles vaccine or an unknown type of measles vaccine during 1963-1967 should receive 2 doses of MMR vaccine. People who received inactivated mumps vaccine or an unknown type of mumps vaccine before 1979 and are at high risk for mumps infection should consider immunization with 2 doses of MMR vaccine. For females of childbearing age, rubella immunity should be determined. If there is no evidence of immunity, females who are not pregnant should be vaccinated. If there is no evidence of immunity, females who are pregnant should delay immunization until after pregnancy. Unvaccinated health care workers born before 1957 who lack laboratory evidence of measles, mumps, or rubella immunity or laboratory confirmation of disease should consider measles and mumps immunization with 2 doses of MMR vaccine or rubella immunization with 1 dose of MMR vaccine.  Pneumococcal 13-valent conjugate (PCV13) vaccine. When indicated, a person who is uncertain of his immunization history and has no record of immunization should receive the PCV13 vaccine. All adults 65 years of age and older should receive this  vaccine. An adult aged 19 years or older who has certain medical conditions and has not been previously immunized should receive 1 dose of PCV13 vaccine. This PCV13 should be followed with a dose of pneumococcal polysaccharide (PPSV23) vaccine. Adults who are at high risk for pneumococcal disease should obtain the PPSV23 vaccine at least 8 weeks after the dose of PCV13 vaccine. Adults older than 72 years of age who have normal immune system function should obtain the PPSV23 vaccine dose at least 1 year after the dose of PCV13 vaccine.  Pneumococcal polysaccharide (PPSV23) vaccine. When PCV13 is also indicated, PCV13 should be obtained first. All adults aged 65 years and older should be immunized. An adult younger than age 65 years who has certain medical conditions should be immunized. Any person who resides in a nursing home or long-term care facility should be immunized. An adult smoker should be immunized. People with an immunocompromised condition and certain other conditions should receive both PCV13 and PPSV23 vaccines. People with human immunodeficiency virus (HIV) infection should be immunized as soon as possible after diagnosis. Immunization during chemotherapy or radiation therapy should be avoided. Routine use of PPSV23 vaccine is not recommended for American Indians, [place] Natives, or people younger than 65 years unless there are medical conditions that require PPSV23 vaccine. When indicated, people who have unknown immunization and have no record of immunization should receive PPSV23 vaccine. One-time revaccination 5 years after the first dose of PPSV23 is recommended for people aged 19-64 years who have chronic kidney failure, nephrotic syndrome, asplenia, or immunocompromised conditions. People who received 1-2 doses of PPSV23 before age 65 years should receive another dose of PPSV23 vaccine at age 65 years or later if at least 5 years have passed since the previous dose. Doses of PPSV23 are not  needed for people immunized with PPSV23 at or after age 65 years.  Meningococcal vaccine. Adults with asplenia or persistent complement component deficiencies should receive 2 doses of quadrivalent meningococcal conjugate (MenACWY-D) vaccine. The doses should be obtained   at least 2 months apart. Microbiologists working with certain meningococcal bacteria, [employer] recruits, people at risk during an outbreak, and people who travel to or live in countries with a high rate of meningitis should be immunized. A first-year college student up through age 3 years who is living in a residence hall should receive a dose if she did not receive a dose on or after her 16th birthday. Adults who have certain high-risk conditions should receive one or more doses of vaccine.  Hepatitis A vaccine. Adults who wish to be protected from this disease, have certain high-risk conditions, work with hepatitis A-infected animals, work in hepatitis A research labs, or travel to or work in countries with a high rate of hepatitis A should be immunized. Adults who were previously unvaccinated and who anticipate close contact with an international adoptee during the first 60 days after arrival in the [place] from a country with a high rate of hepatitis A should be immunized.  Hepatitis B vaccine. Adults who wish to be protected from this disease, have certain high-risk conditions, may be exposed to blood or other infectious body fluids, are household contacts or sex partners of hepatitis B positive people, are clients or workers in certain care facilities, or travel to or work in countries with a high rate of hepatitis B should be immunized.  Haemophilus influenzae type b (Hib) vaccine. A previously unvaccinated person with asplenia or sickle cell disease or having a scheduled splenectomy should receive 1 dose of Hib vaccine. Regardless of previous immunization, a recipient of a hematopoietic stem cell transplant should receive a  3-dose series 6-12 months after her successful transplant. Hib vaccine is not recommended for adults with HIV infection. Preventive Services / Frequency Ages 28 to 9 years  Blood pressure check.** / Every 3-5 years.  Lipid and cholesterol check.** / Every 5 years beginning at age 4.  Clinical breast exam.** / Every 3 years for women in their 83s and 16s.  BRCA-related cancer risk assessment.** / For women who have family members with a BRCA-related cancer (breast, ovarian, tubal, or peritoneal cancers).  Pap test.** / Every 2 years from ages 16 through 44. Every 3 years starting at age 3 through age 68 or 67 with a history of 3 consecutive normal Pap tests.  HPV screening.** / Every 3 years from ages 36 through ages 37 to 9 with a history of 3 consecutive normal Pap tests.  Hepatitis C blood test.** / For any individual with known risks for hepatitis C.  Skin self-exam. / Monthly.  Influenza vaccine. / Every year.  Tetanus, diphtheria, and acellular pertussis (Tdap, Td) vaccine.** / Consult your health care provider. Pregnant women should receive 1 dose of Tdap vaccine during each pregnancy. 1 dose of Td every 10 years.  Varicella vaccine.** / Consult your health care provider. Pregnant females who do not have evidence of immunity should receive the first dose after pregnancy.  HPV vaccine. / 3 doses over 6 months, if 66 and younger. The vaccine is not recommended for use in pregnant females. However, pregnancy testing is not needed before receiving a dose.  Measles, mumps, rubella (MMR) vaccine.** / You need at least 1 dose of MMR if you were born in 1957 or later. You may also need a 2nd dose. For females of childbearing age, rubella immunity should be determined. If there is no evidence of immunity, females who are not pregnant should be vaccinated. If there is no evidence of immunity, females who are  pregnant should delay immunization until after pregnancy.  Pneumococcal  13-valent conjugate (PCV13) vaccine.** / Consult your health care provider.  Pneumococcal polysaccharide (PPSV23) vaccine.** / 1 to 2 doses if you smoke cigarettes or if you have certain conditions.  Meningococcal vaccine.** / 1 dose if you are age 68 to 8 years and a Market researcher living in a residence hall, or have one of several medical conditions, you need to get vaccinated against meningococcal disease. You may also need additional booster doses.  Hepatitis A vaccine.** / Consult your health care provider.  Hepatitis B vaccine.** / Consult your health care provider.  Haemophilus influenzae type b (Hib) vaccine.** / Consult your health care provider. Ages 7 to 53 years  Blood pressure check.** / Every year.  Lipid and cholesterol check.** / Every 5 years beginning at age 25 years.  Lung cancer screening. / Every year if you are aged 11-80 years and have a 30-pack-year history of smoking and currently smoke or have quit within the past 15 years. Yearly screening is stopped once you have quit smoking for at least 15 years or develop a health problem that would prevent you from having lung cancer treatment.  Clinical breast exam.** / Every year after age 48 years.  BRCA-related cancer risk assessment.** / For women who have family members with a BRCA-related cancer (breast, ovarian, tubal, or peritoneal cancers).  Mammogram.** / Every year beginning at age 41 years and continuing for as long as you are in good health. Consult with your health care provider.  Pap test.** / Every 3 years starting at age 65 years through age 37 or 70 years with a history of 3 consecutive normal Pap tests.  HPV screening.** / Every 3 years from ages 72 years through ages 60 to 40 years with a history of 3 consecutive normal Pap tests.  Fecal occult blood test (FOBT) of stool. / Every year beginning at age 21 years and continuing until age 5 years. You may not need to do this test if you get  a colonoscopy every 10 years.  Flexible sigmoidoscopy or colonoscopy.** / Every 5 years for a flexible sigmoidoscopy or every 10 years for a colonoscopy beginning at age 35 years and continuing until age 48 years.  Hepatitis C blood test.** / For all people born from [birth year] through 1965 and any individual with known risks for hepatitis C.  Skin self-exam. / Monthly.  Influenza vaccine. / Every year.  Tetanus, diphtheria, and acellular pertussis (Tdap/Td) vaccine.** / Consult your health care provider. Pregnant women should receive 1 dose of Tdap vaccine during each pregnancy. 1 dose of Td every 10 years.  Varicella vaccine.** / Consult your health care provider. Pregnant females who do not have evidence of immunity should receive the first dose after pregnancy.  Zoster vaccine.** / 1 dose for adults aged 30 years or older.  Measles, mumps, rubella (MMR) vaccine.** / You need at least 1 dose of MMR if you were born in 1957 or later. You may also need a second dose. For females of childbearing age, rubella immunity should be determined. If there is no evidence of immunity, females who are not pregnant should be vaccinated. If there is no evidence of immunity, females who are pregnant should delay immunization until after pregnancy.  Pneumococcal 13-valent conjugate (PCV13) vaccine.** / Consult your health care provider.  Pneumococcal polysaccharide (PPSV23) vaccine.** / 1 to 2 doses if you smoke cigarettes or if you have certain conditions.  Meningococcal vaccine.** /  Consult your health care provider.  Hepatitis A vaccine.** / Consult your health care provider.  Hepatitis B vaccine.** / Consult your health care provider.  Haemophilus influenzae type b (Hib) vaccine.** / Consult your health care provider. Ages 77 years and over  Blood pressure check.** / Every year.  Lipid and cholesterol check.** / Every 5 years beginning at age 107 years.  Lung cancer screening. / Every year if you  are aged 72-80 years and have a 30-pack-year history of smoking and currently smoke or have quit within the past 15 years. Yearly screening is stopped once you have quit smoking for at least 15 years or develop a health problem that would prevent you from having lung cancer treatment.  Clinical breast exam.** / Every year after age 62 years.  BRCA-related cancer risk assessment.** / For women who have family members with a BRCA-related cancer (breast, ovarian, tubal, or peritoneal cancers).  Mammogram.** / Every year beginning at age 32 years and continuing for as long as you are in good health. Consult with your health care provider.  Pap test.** / Every 3 years starting at age 41 years through age 57 or 67 years with 3 consecutive normal Pap tests. Testing can be stopped between 65 and 70 years with 3 consecutive normal Pap tests and no abnormal Pap or HPV tests in the past 10 years.  HPV screening.** / Every 3 years from ages 27 years through ages 32 or 79 years with a history of 3 consecutive normal Pap tests. Testing can be stopped between 65 and 70 years with 3 consecutive normal Pap tests and no abnormal Pap or HPV tests in the past 10 years.  Fecal occult blood test (FOBT) of stool. / Every year beginning at age 16 years and continuing until age 80 years. You may not need to do this test if you get a colonoscopy every 10 years.  Flexible sigmoidoscopy or colonoscopy.** / Every 5 years for a flexible sigmoidoscopy or every 10 years for a colonoscopy beginning at age 62 years and continuing until age 7 years.  Hepatitis C blood test.** / For all people born from [birth year] through 1963 and any individual with known risks for hepatitis C.  Osteoporosis screening.** / A one-time screening for women ages 12 years and over and women at risk for fractures or osteoporosis.  Skin self-exam. / Monthly.  Influenza vaccine. / Every year.  Tetanus, diphtheria, and acellular pertussis (Tdap/Td)  vaccine.** / 1 dose of Td every 10 years.  Varicella vaccine.** / Consult your health care provider.  Zoster vaccine.** / 1 dose for adults aged 69 years or older.  Pneumococcal 13-valent conjugate (PCV13) vaccine.** / Consult your health care provider.  Pneumococcal polysaccharide (PPSV23) vaccine.** / 1 dose for all adults aged 64 years and older.  Meningococcal vaccine.** / Consult your health care provider.  Hepatitis A vaccine.** / Consult your health care provider.  Hepatitis B vaccine.** / Consult your health care provider.  Haemophilus influenzae type b (Hib) vaccine.** / Consult your health care provider. ** Family history and personal history of risk and conditions may change your health care provider's recommendations.   This information is not intended to replace advice given to you by your health care provider. Make sure you discuss any questions you have with your health care provider.   Document Released: 08/23/2001 Document Revised: 07/18/2014 Document Reviewed: 11/22/2010 Elsevier Interactive Patient Education [employer].

## 2015-07-08 NOTE — Progress Notes (Signed)
Subjective:  Patient ID: Laurie Pratt, female    DOB: 1941/03/10  Age: 74 y.o. MRN: [id number]  CC: No chief complaint on file.   HPI Laurie Pratt presents for a well exam  Outpatient Prescriptions Prior to Visit  Medication Sig Dispense Refill  . permethrin (ELIMITE) 5 % cream Apply as instructed tonight and repeat in 1 week. 60 g 1  . trihexyphenidyl (ARTANE) 2 MG tablet Take 1.5 tablets (3 mg total) by mouth 3 (three) times daily. 140 tablet 11  . atenolol-chlorthalidone (TENORETIC) 100-25 MG tablet take 1 tablet by mouth every morning 15 tablet 0  . hydrOXYzine (ATARAX/VISTARIL) 25 MG tablet Take 1 tablet (25 mg total) by mouth every 6 (six) hours. For itching 20 tablet 0  . losartan (COZAAR) 100 MG tablet take 1 tablet by mouth once daily 15 tablet 0   No facility-administered medications prior to visit.    ROS Review of Systems  Constitutional: Negative for chills, activity change, appetite change, fatigue and unexpected weight change.  HENT: Negative for congestion, mouth sores and sinus pressure.   Eyes: Negative for visual disturbance.  Respiratory: Negative for cough and chest tightness.   Gastrointestinal: Negative for nausea and abdominal pain.  Genitourinary: Negative for frequency, difficulty urinating and vaginal pain.  Musculoskeletal: Negative for back pain and gait problem.  Skin: Negative for pallor and rash.  Neurological: Negative for dizziness, tremors, weakness, light-headedness, numbness and headaches.  Psychiatric/Behavioral: Negative for confusion and sleep disturbance.    Objective:  BP 118/70 mmHg  Pulse 62  Ht 5\' 7"  (1.702 m)  Wt 175 lb (79.379 kg)  BMI 27.40 kg/m2  SpO2 96%  BP Readings from Last 3 Encounters:  07/08/15 118/70  02/23/15 169/84  01/22/15 141/69    Wt Readings from Last 3 Encounters:  07/08/15 175 lb (79.379 kg)  01/22/15 168 lb (76.204 kg)  07/22/14 173 lb (78.472 kg)    Physical Exam  Constitutional: She  appears well-developed. No distress.  HENT:  Head: Normocephalic.  Right Ear: External ear normal.  Left Ear: External ear normal.  Nose: Nose normal.  Mouth/Throat: Oropharynx is clear and moist.  Eyes: Conjunctivae are normal. Pupils are equal, round, and reactive to light. Right eye exhibits no discharge. Left eye exhibits no discharge.  Neck: Normal range of motion. Neck supple. No JVD present. No tracheal deviation present. No thyromegaly present.  Cardiovascular: Normal rate, regular rhythm and normal heart sounds.   Pulmonary/Chest: No stridor. No respiratory distress. She has no wheezes.  Abdominal: Soft. Bowel sounds are normal. She exhibits no distension and no mass. There is no tenderness. There is no rebound and no guarding.  Musculoskeletal: She exhibits no edema or tenderness.  Lymphadenopathy:    She has no cervical adenopathy.  Neurological: She displays normal reflexes. A cranial nerve deficit is present. She exhibits normal muscle tone. Coordination normal.  Skin: No rash noted. No erythema.  Psychiatric: She has a normal mood and affect. Her behavior is normal. Judgment and thought content normal.  facial tic  Lab Results  Component Value Date   WBC 10.0 07/23/2014   HGB 11.9* 07/23/2014   HCT 35.7* 07/23/2014   PLT 252 07/23/2014   GLUCOSE 106* 07/23/2014   CHOL 243* 04/30/2013   TRIG 91.0 04/30/2013   HDL 62.40 04/30/2013   LDLDIRECT 155.6 04/30/2013   ALT 17 06/09/2014   AST 21 06/09/2014   NA 138 07/23/2014   K 4.8 07/23/2014   CL 102 07/23/2014  CREATININE 1.18* 07/23/2014   BUN 13 07/23/2014   CO2 26 07/23/2014   TSH 1.84 06/09/2014   INR 1.0 06/09/2014    No results found.  Assessment & Plan:   Diagnoses and all orders for this visit:  Well adult exam -     TSH; Future -     Urinalysis; Future -     Lipid panel; Future -     Hepatitis C antibody; Future -     CBC with Differential/Platelet; Future -     Basic metabolic panel; Future -      Hepatic function panel; Future  Essential hypertension -     TSH; Future -     Urinalysis; Future -     Lipid panel; Future -     Hepatitis C antibody; Future -     CBC with Differential/Platelet; Future -     Basic metabolic panel; Future -     Hepatic function panel; Future  Tic disorder -     TSH; Future -     Urinalysis; Future -     Lipid panel; Future -     Hepatitis C antibody; Future -     CBC with Differential/Platelet; Future -     Basic metabolic panel; Future -     Hepatic function panel; Future  Dyslipidemia -     TSH; Future -     Urinalysis; Future -     Lipid panel; Future -     Hepatitis C antibody; Future -     CBC with Differential/Platelet; Future -     Basic metabolic panel; Future -     Hepatic function panel; Future  Other orders -     atenolol-chlorthalidone (TENORETIC) 100-25 MG tablet; Take 1 tablet by mouth every morning. -     losartan (COZAAR) 100 MG tablet; Take 1 tablet (100 mg total) by mouth daily. -     hydrOXYzine (ATARAX/VISTARIL) 25 MG tablet; Take 1 tablet (25 mg total) by mouth every 6 (six) hours. For itching  I have changed Ms. Bainter's losartan. I am also having her maintain her trihexyphenidyl, permethrin, dorzolamide-timolol, atenolol-chlorthalidone, and hydrOXYzine.  Meds ordered this encounter  Medications  . dorzolamide-timolol (COSOPT) 22.3-6.8 MG/ML ophthalmic solution    Sig: instill 1 drop into left eye twice a day    Refill:  1  . atenolol-chlorthalidone (TENORETIC) 100-25 MG tablet    Sig: Take 1 tablet by mouth every morning.    Dispense:  90 tablet    Refill:  3  . losartan (COZAAR) 100 MG tablet    Sig: Take 1 tablet (100 mg total) by mouth daily.    Dispense:  90 tablet    Refill:  3  . hydrOXYzine (ATARAX/VISTARIL) 25 MG tablet    Sig: Take 1 tablet (25 mg total) by mouth every 6 (six) hours. For itching    Dispense:  90 tablet    Refill:  3     Follow-up: Return in about 6 months (around  01/06/2016) for a follow-up visit.  Walker Kehr, MD

## 2015-07-08 NOTE — Assessment & Plan Note (Signed)
Labs

## 2015-07-08 NOTE — Assessment & Plan Note (Signed)
Labs On Losartan, Tenoretic 

## 2015-07-08 NOTE — Progress Notes (Signed)
Pre visit review using our clinic review tool, if applicable. No additional management support is needed unless otherwise documented below in the visit note. 

## 2015-07-09 LAB — HEPATITIS C ANTIBODY: HCV Ab: NEGATIVE

## 2015-07-10 ENCOUNTER — Other Ambulatory Visit: Payer: Self-pay | Admitting: Internal Medicine

## 2015-07-10 MED ORDER — CIPROFLOXACIN HCL 250 MG PO TABS: 250.0000 mg | ORAL_TABLET | Freq: Two times a day (BID) | ORAL | Status: DC

## 2015-09-11 ENCOUNTER — Encounter: Payer: Medicare Other | Admitting: Gynecology

## 2015-09-15 ENCOUNTER — Encounter: Payer: Medicare Other | Admitting: Gynecology

## 2015-10-09 DIAGNOSIS — H2511 Age-related nuclear cataract, right eye: Secondary | ICD-10-CM | POA: Diagnosis not present

## 2015-10-09 DIAGNOSIS — H401111 Primary open-angle glaucoma, right eye, mild stage: Secondary | ICD-10-CM | POA: Diagnosis not present

## 2015-10-09 DIAGNOSIS — Z961 Presence of intraocular lens: Secondary | ICD-10-CM | POA: Diagnosis not present

## 2015-10-09 DIAGNOSIS — H401113 Primary open-angle glaucoma, right eye, severe stage: Secondary | ICD-10-CM | POA: Diagnosis not present

## 2015-11-02 ENCOUNTER — Ambulatory Visit (INDEPENDENT_AMBULATORY_CARE_PROVIDER_SITE_OTHER): Payer: Medicare Other | Admitting: Gynecology

## 2015-11-02 ENCOUNTER — Encounter: Payer: Self-pay | Admitting: Gynecology

## 2015-11-02 VITALS — BP 140/80 | Ht 67.0 in | Wt 175.0 lb

## 2015-11-02 DIAGNOSIS — R87622 Low grade squamous intraepithelial lesion on cytologic smear of vagina (LGSIL): Secondary | ICD-10-CM | POA: Diagnosis not present

## 2015-11-02 DIAGNOSIS — A499 Bacterial infection, unspecified: Secondary | ICD-10-CM | POA: Diagnosis not present

## 2015-11-02 DIAGNOSIS — Z01419 Encounter for gynecological examination (general) (routine) without abnormal findings: Secondary | ICD-10-CM | POA: Diagnosis not present

## 2015-11-02 DIAGNOSIS — Z780 Asymptomatic menopausal state: Secondary | ICD-10-CM

## 2015-11-02 DIAGNOSIS — Z87410 Personal history of cervical dysplasia: Secondary | ICD-10-CM

## 2015-11-02 DIAGNOSIS — L292 Pruritus vulvae: Secondary | ICD-10-CM | POA: Diagnosis not present

## 2015-11-02 DIAGNOSIS — N952 Postmenopausal atrophic vaginitis: Secondary | ICD-10-CM | POA: Diagnosis not present

## 2015-11-02 DIAGNOSIS — N760 Acute vaginitis: Secondary | ICD-10-CM | POA: Diagnosis not present

## 2015-11-02 DIAGNOSIS — L293 Anogenital pruritus, unspecified: Secondary | ICD-10-CM | POA: Diagnosis not present

## 2015-11-02 DIAGNOSIS — B9689 Other specified bacterial agents as the cause of diseases classified elsewhere: Secondary | ICD-10-CM

## 2015-11-02 LAB — WET PREP FOR TRICH, YEAST, CLUE
Clue Cells Wet Prep HPF POC: NONE SEEN
TRICH WET PREP: NONE SEEN
YEAST WET PREP: NONE SEEN

## 2015-11-02 MED ORDER — ESTROGENS, CONJUGATED 0.625 MG/GM VA CREA
TOPICAL_CREAM | VAGINAL | Status: DC
Start: 2015-11-02 — End: 2015-12-23

## 2015-11-02 MED ORDER — METRONIDAZOLE 500 MG PO TABS: 500.0000 mg | ORAL_TABLET | Freq: Two times a day (BID) | ORAL | Status: DC

## 2015-11-02 NOTE — Progress Notes (Signed)
Laurie Pratt 29-May-1941 [id number]   History:    75 y.o.  for annual gyn exam who last year underwent a total abdominal hysterectomy with bilateral salpingo-oophorectomy and lysis of pelvic adhesions as a result of cervical intraepithelial neoplasia III. her final pathology report demonstrated the following: Diagnosis 1. Cervix, resection w/o uterus HIGH GRADE SQUAMOUS INTRAEPITHELIAL LESION, CIN-II (MODERATE DYSPLASIA) AND ADJACENT LOW GRADE SQUAMOUS INTRAEPITHELIAL LESION, CIN-I (MILD DYSPLASIA). MARGINS NEGATIVE FOR DYSPLASIA OR MALIGNANCY. 2. Uterus, ovaries and fallopian tubes ENDOMETRIUM: BENIGN PROLIFERATIVE ENDOMETRIUM, NO ATYPIA, HYPERPLASIA OR MALIGNANCY. MYOMETRIUM: LEIOMYOMA AND ADENOMYOSIS. UTERINE SEROSA: UNREMARKABLE. BILATERAL OVARIES AND FALLOPIAN TUBES: NO PATHOLOGIC ABNORMALITIES. Microscopic Comment 1. The dysplastic squamous epithelium is positive for p16 and shows increase of Ki-67 stain.  She has done well and complaining of vaginal dryness and irritation. Patient not sexually active. Her PCP is Dr. Alain Marion who has been doing her blood work. Patient with past history of benign colon polyps her last colonoscopy was in 2011. She had an normal bone density study in 2010 has not had a follow-up. Also her mammogram was in 2014 overdue.   Past medical history,surgical history, family history and social history were all reviewed and documented in the EPIC chart.  Gynecologic History No LMP recorded. Patient is postmenopausal. Contraception: status post hysterectomy Last Pap: See above. Results were: See above Last mammogram: 2014. Results were: normal  Obstetric History OB History  Gravida Para Term Preterm AB SAB TAB Ectopic Multiple Living  '4 3   1 1    3    ' # Outcome Date GA Lbr Len/2nd Weight Sex Delivery Anes PTL Lv  4 SAB           3 Para           2 Para           1 Para                ROS: A ROS was performed and pertinent positives and  negatives are included in the history.  GENERAL: No fevers or chills. HEENT: No change in vision, no earache, sore throat or sinus congestion. NECK: No pain or stiffness. CARDIOVASCULAR: No chest pain or pressure. No palpitations. PULMONARY: No shortness of breath, cough or wheeze. GASTROINTESTINAL: No abdominal pain, nausea, vomiting or diarrhea, melena or bright red blood per rectum. GENITOURINARY: No urinary frequency, urgency, hesitancy or dysuria. MUSCULOSKELETAL: No joint or muscle pain, no back pain, no recent trauma. DERMATOLOGIC: No rash, no itching, no lesions. ENDOCRINE: No polyuria, polydipsia, no heat or cold intolerance. No recent change in weight. HEMATOLOGICAL: No anemia or easy bruising or bleeding. NEUROLOGIC: No headache, seizures, numbness, tingling or weakness. PSYCHIATRIC: No depression, no loss of interest in normal activity or change in sleep pattern.     Exam: chaperone present  BP 140/80 mmHg  Ht '5\' 7"'  (1.702 m)  Wt 175 lb (79.379 kg)  BMI 27.40 kg/m2  Body mass index is 27.4 kg/(m^2).  General appearance : Well developed well nourished female. No acute distress HEENT: Eyes: no retinal hemorrhage or exudates,  Neck supple, trachea midline, no carotid bruits, no thyroidmegaly Lungs: Clear to auscultation, no rhonchi or wheezes, or rib retractions  Heart: Regular rate and rhythm, no murmurs or gallops Breast:Examined in sitting and supine position were symmetrical in appearance, no palpable masses or tenderness,  no skin retraction, no nipple inversion, no nipple discharge, no skin discoloration, no axillary or supraclavicular lymphadenopathy Abdomen: no palpable masses or tenderness, no  rebound or guarding Extremities: no edema or skin discoloration or tenderness  Pelvic:  Bartholin, Urethra, Skene Glands: Within normal limits             Vagina: Friability on contact very atrophic  Cervix: Absent  Uterus absent  Adnexa  Without masses or tenderness  Anus and  perineum  normal   Rectovaginal  normal sphincter tone without palpated masses or tenderness             Hemoccult colonoscopy scheduled     Assessment/Plan:  75 y.o. female for annual exam with atrophic vagina friable on contact she will be prescribed Premarin vaginal cream to apply twice a week. The wet prep today demonstrated few WBC and moderate bacteria. She'll be prescribed Flagyl 500 mg one by mouth twice a day for 7 days. Patient will schedule her bone density study here in the office as well. She was reminded to contact her gastroenterologist for her overdue colonoscopy. Because of her history of high-grade dysplasia a Pap smear was done today as well. She was reminded on the importance of calcium vitamin D and weightbearing exercises for osteoporosis prevention.   Terrance Mass MD, 4:29 PM 11/02/2015

## 2015-11-02 NOTE — Patient Instructions (Addendum)
Conjugated Estrogens vaginal cream What is this medicine? CONJUGATED ESTROGENS (CON ju gate ed ESS troe jenz) are a mixture of female hormones. This cream can help relieve symptoms associated with menopause.like vaginal dryness and irritation. This medicine may be used for other purposes; ask your health care provider or pharmacist if you have questions. What should I tell my health care provider before I take this medicine? They need to know if you have any of these conditions: -abnormal vaginal bleeding -blood vessel disease or blood clots -breast, cervical, endometrial, or uterine cancer -dementia -diabetes -gallbladder disease -heart disease or recent heart attack -high blood pressure -high cholesterol -high level of calcium in the blood -hysterectomy -kidney disease -liver disease -migraine headaches -protein C deficiency -protein S deficiency -stroke -systemic lupus erythematosus (SLE) -tobacco smoker -an unusual or allergic reaction to estrogens other medicines, foods, dyes, or preservatives -pregnant or trying to get pregnant -breast-feeding How should I use this medicine? This medicine is for use in the vagina only. Do not take by mouth. Follow the directions on the prescription label. Use at bedtime unless otherwise directed by your doctor or health care professional. Use the special applicator supplied with the cream. Wash hands before and after use. Fill the applicator with the cream and remove from the tube. Lie on your back, part and bend your knees. Insert the applicator into the vagina and push the plunger to expel the cream into the vagina. Wash the applicator with warm soapy water and rinse well. Use exactly as directed for the complete length of time prescribed. Do not stop using except on the advice of your doctor or health care professional. Talk to your pediatrician regarding the use of this medicine in children. Special care may be needed. A patient package  insert for the product will be given with each prescription and refill. Read this sheet carefully each time. The sheet may change frequently. Overdosage: If you think you have taken too much of this medicine contact a poison control center or emergency room at once. NOTE: This medicine is only for you. Do not share this medicine with others. What if I miss a dose? If you miss a dose, use it as soon as you can. If it is almost time for your next dose, use only that dose. Do not use double or extra doses. What may interact with this medicine? Do not take this medicine with any of the following medications: -aromatase inhibitors like aminoglutethimide, anastrozole, exemestane, letrozole, testolactone This medicine may also interact with the following medications: -barbiturates used for inducing sleep or treating seizures -carbamazepine -grapefruit juice -medicines for fungal infections like itraconazole and ketoconazole -raloxifene or tamoxifen -rifabutin -rifampin -rifapentine -ritonavir -some antibiotics used to treat infections -St. John's Wort -warfarin This list may not describe all possible interactions. Give your health care provider a list of all the medicines, herbs, non-prescription drugs, or dietary supplements you use. Also tell them if you smoke, drink alcohol, or use illegal drugs. Some items may interact with your medicine. What should I watch for while using this medicine? Visit your health care professional for regular checks on your progress. You will need a regular breast and pelvic exam. You should also discuss the need for regular mammograms with your health care professional, and follow his or her guidelines. This medicine can make your body retain fluid, making your fingers, hands, or ankles swell. Your blood pressure can go up. Contact your doctor or health care professional if you feel you are retaining   fluid. If you have any reason to think you are pregnant; stop  taking this medicine at once and contact your doctor or health care professional. Tobacco smoking increases the risk of getting a blood clot or having a stroke, especially if you are more than 73 years old. You are strongly advised not to smoke. If you wear contact lenses and notice visual changes, or if the lenses begin to feel uncomfortable, consult your eye care specialist. If you are going to have elective surgery, you may need to stop taking this medicine beforehand. Consult your health care professional for advice prior to scheduling the surgery. What side effects may I notice from receiving this medicine? Side effects that you should report to your doctor or health care professional as soon as possible: -allergic reactions like skin rash, itching or hives, swelling of the face, lips, or tongue -breast tissue changes or discharge -changes in vision -chest pain -confusion, trouble speaking or understanding -dark urine -general ill feeling or flu-like symptoms -light-colored stools -nausea, vomiting -pain, swelling, warmth in the leg -right upper belly pain -severe headaches -shortness of breath -sudden numbness or weakness of the face, arm or leg -trouble walking, dizziness, loss of balance or coordination -unusual vaginal bleeding -yellowing of the eyes or skin Side effects that usually do not require medical attention (report to your doctor or health care professional if they continue or are bothersome): -hair loss -increased hunger or thirst -increased urination -symptoms of vaginal infection like itching, irritation or unusual discharge -unusually weak or tired This list may not describe all possible side effects. Call your doctor for medical advice about side effects. You may report side effects to FDA at 1-800-FDA-1088. Where should I keep my medicine? Keep out of the reach of children. Store at room temperature between 15 and 30 degrees C (59 and 86 degrees F). Throw away  any unused medicine after the expiration date. NOTE: This sheet is a summary. It may not cover all possible information. If you have questions about this medicine, talk to your doctor, pharmacist, or health care provider.    2016, Elsevier/Gold Standard. (2010-09-29 09:20:36)  Metronidazole tablets or capsules What is this medicine? METRONIDAZOLE (me troe NI da zole) is an antiinfective. It is used to treat certain kinds of bacterial and protozoal infections. It will not work for colds, flu, or other viral infections. This medicine may be used for other purposes; ask your health care provider or pharmacist if you have questions. What should I tell my health care provider before I take this medicine? They need to know if you have any of these conditions: -anemia or other blood disorders -disease of the nervous system -fungal or yeast infection -if you drink alcohol containing drinks -liver disease -seizures -an unusual or allergic reaction to metronidazole, or other medicines, foods, dyes, or preservatives -pregnant or trying to get pregnant -breast-feeding How should I use this medicine? Take this medicine by mouth with a full glass of water. Follow the directions on the prescription label. Take your medicine at regular intervals. Do not take your medicine more often than directed. Take all of your medicine as directed even if you think you are better. Do not skip doses or stop your medicine early. Talk to your pediatrician regarding the use of this medicine in children. Special care may be needed. Overdosage: If you think you have taken too much of this medicine contact a poison control center or emergency room at once. NOTE: This medicine is only for  you. Do not share this medicine with others. What if I miss a dose? If you miss a dose, take it as soon as you can. If it is almost time for your next dose, take only that dose. Do not take double or extra doses. What may interact with this  medicine? Do not take this medicine with any of the following medications: -alcohol or any product that contains alcohol -amprenavir oral solution -cisapride -disulfiram -dofetilide -dronedarone -paclitaxel injection -pimozide -ritonavir oral solution -sertraline oral solution -sulfamethoxazole-trimethoprim injection -thioridazine -ziprasidone This medicine may also interact with the following medications: -birth control pills -cimetidine -lithium -other medicines that prolong the QT interval (cause an abnormal heart rhythm) -phenobarbital -phenytoin -warfarin This list may not describe all possible interactions. Give your health care provider a list of all the medicines, herbs, non-prescription drugs, or dietary supplements you use. Also tell them if you smoke, drink alcohol, or use illegal drugs. Some items may interact with your medicine. What should I watch for while using this medicine? Tell your doctor or health care professional if your symptoms do not improve or if they get worse. You may get drowsy or dizzy. Do not drive, use machinery, or do anything that needs mental alertness until you know how this medicine affects you. Do not stand or sit up quickly, especially if you are an older patient. This reduces the risk of dizzy or fainting spells. Avoid alcoholic drinks while you are taking this medicine and for three days afterward. Alcohol may make you feel dizzy, sick, or flushed. If you are being treated for a sexually transmitted disease, avoid sexual contact until you have finished your treatment. Your sexual partner may also need treatment. What side effects may I notice from receiving this medicine? Side effects that you should report to your doctor or health care professional as soon as possible: -allergic reactions like skin rash or hives, swelling of the face, lips, or tongue -confusion, clumsiness -difficulty speaking -discolored or sore mouth -dizziness -fever,  infection -numbness, tingling, pain or weakness in the hands or feet -trouble passing urine or change in the amount of urine -redness, blistering, peeling or loosening of the skin, including inside the mouth -seizures -unusually weak or tired -vaginal irritation, dryness, or discharge Side effects that usually do not require medical attention (report to your doctor or health care professional if they continue or are bothersome): -diarrhea -headache -irritability -metallic taste -nausea -stomach pain or cramps -trouble sleeping This list may not describe all possible side effects. Call your doctor for medical advice about side effects. You may report side effects to FDA at 1-800-FDA-1088. Where should I keep my medicine? Keep out of the reach of children. Store at room temperature below 25 degrees C (77 degrees F). Protect from light. Keep container tightly closed. Throw away any unused medicine after the expiration date. NOTE: This sheet is a summary. It may not cover all possible information. If you have questions about this medicine, talk to your doctor, pharmacist, or health care provider.    2016, Elsevier/Gold Standard. (2013-02-01 14:08:39)

## 2015-11-03 LAB — PAP IG W/ RFLX HPV ASCU

## 2015-11-19 ENCOUNTER — Ambulatory Visit: Payer: Medicare Other

## 2015-11-19 ENCOUNTER — Other Ambulatory Visit: Payer: Self-pay | Admitting: Gynecology

## 2015-11-19 DIAGNOSIS — M899 Disorder of bone, unspecified: Secondary | ICD-10-CM | POA: Diagnosis not present

## 2015-11-19 DIAGNOSIS — N951 Menopausal and female climacteric states: Secondary | ICD-10-CM | POA: Diagnosis not present

## 2015-11-19 DIAGNOSIS — Z780 Asymptomatic menopausal state: Secondary | ICD-10-CM

## 2015-11-27 ENCOUNTER — Encounter: Payer: Self-pay | Admitting: Gynecology

## 2015-11-27 ENCOUNTER — Ambulatory Visit (INDEPENDENT_AMBULATORY_CARE_PROVIDER_SITE_OTHER): Payer: Medicare Other | Admitting: Gynecology

## 2015-11-27 VITALS — BP 138/84

## 2015-11-27 DIAGNOSIS — N893 Dysplasia of vagina, unspecified: Secondary | ICD-10-CM | POA: Diagnosis not present

## 2015-11-27 DIAGNOSIS — N891 Moderate vaginal dysplasia: Secondary | ICD-10-CM | POA: Diagnosis not present

## 2015-11-27 DIAGNOSIS — Z87410 Personal history of cervical dysplasia: Secondary | ICD-10-CM

## 2015-11-27 NOTE — Progress Notes (Signed)
   Patient is a 75 year old was seen the office on April 24 for her annual exam. Patient is here as a result of her abnormal Pap smear time of that visit. Her history is as follows:  In 2015 patient underwent a total abdominal hysterectomy with bilateral salpingo-oophorectomy and lysis of pelvic adhesions as a result of cervical intraepithelial neoplasia III. her final pathology report demonstrated the following: Diagnosis 1. Cervix, resection w/o uterus HIGH GRADE SQUAMOUS INTRAEPITHELIAL LESION, CIN-II (MODERATE DYSPLASIA) AND ADJACENT LOW GRADE SQUAMOUS INTRAEPITHELIAL LESION, CIN-I (MILD DYSPLASIA). MARGINS NEGATIVE FOR DYSPLASIA OR MALIGNANCY. 2. Uterus, ovaries and fallopian tubes ENDOMETRIUM: BENIGN PROLIFERATIVE ENDOMETRIUM, NO ATYPIA, HYPERPLASIA OR MALIGNANCY. MYOMETRIUM: LEIOMYOMA AND ADENOMYOSIS. UTERINE SEROSA: UNREMARKABLE. BILATERAL OVARIES AND FALLOPIAN TUBES: NO PATHOLOGIC ABNORMALITIES. Microscopic Comment 1. The dysplastic squamous epithelium is positive for p16 and shows increase of Ki-67 stain.  Pap smear done on 11/02/2015 demonstrated the following:  FINAL DIAGNOSIS: SEE NOTE (A)   Comments: -  EPITHELIAL CELL ABNORMALITY: SQUAMOUS CELLS. LOW-GRADE SQUAMOUS  INTRAEPITHELIAL LESION (LSIL) ENCOMPASSING HPV/MILD DYSPLASIA/ VAIN 1  WITH A FEW CELLS SUSPICIOUS FOR A HIGH-GRADE LESION IDENTIFIED        Patient was counseled for colposcopic evaluation. Patient underwent a detail colposcopic evaluation of the external genitalia, perineum and perirectal region. On the left anal region approximate 4:00 position there was a leukoplakic area which was cleansed with Betadine solution and 1% lidocaine was infiltrated subdermally any keypunch biopsy was obtained and silver 92 was used for hemostasis. After this a speculum was introduced into the vagina and systematic inspection vagina and only demonstrated at the vaginal cuff leukoplakic areas and 3 separate biopsies were obtained  from the vaginal cuff and submitted for histological evaluation. Monsel solution was used for hemostasis.  Physical Exam  Genitourinary:       Assessment/plan: Patient status post total abdominal hysterectomy bilateral salpingo-oophorectomy in 2015 as a result of narrow vagina and CIN-3. Pathology report from that surgery as described above with negative margins. Patient with abnormal Pap smear recently had a detail colposcopic evaluation as described above. Acetowhite area the vaginal cuff was biopsied submitted for histological evaluation. Perianal lesion was biopsied as well. We'll contact the patient was a results become available.

## 2015-11-27 NOTE — Patient Instructions (Signed)
Colposcopy  Care After  Colposcopy is a procedure in which a special tool is used to magnify the surface of the cervix. A tissue sample (biopsy) may also be taken. This sample will be looked at for cervical cancer or other problems. After the test:  · You may have some cramping.  · Lie down for a few minutes if you feel lightheaded.  ·  You may have some bleeding which should stop in a few days.  HOME CARE  · Do not have sex or use tampons for 2 to 3 days or as told.  · Only take medicine as told by your doctor.  · Continue to take your birth control pills as usual.  Finding out the results of your test  Ask when your test results will be ready. Make sure you get your test results.  GET HELP RIGHT AWAY IF:  · You are bleeding a lot or are passing blood clots.  · You develop a fever of 102° F (38.9° C) or higher.  · You have abnormal vaginal discharge.  · You have cramps that do not go away with medicine.  · You feel lightheaded, dizzy, or pass out (faint).  MAKE SURE YOU:   · Understand these instructions.  · Will watch your condition.  · Will get help right away if you are not doing well or get worse.     This information is not intended to replace advice given to you by your health care provider. Make sure you discuss any questions you have with your health care provider.     Document Released: 12/14/2007 Document Revised: 09/19/2011 Document Reviewed: 01/24/2013  Elsevier Interactive Patient Education ©2016 Elsevier Inc.

## 2015-12-01 LAB — PATHOLOGY

## 2015-12-03 ENCOUNTER — Telehealth: Payer: Self-pay | Admitting: *Deleted

## 2015-12-03 NOTE — Telephone Encounter (Signed)
Left message at cancer center to call and schedule the below and call me back with time and date.

## 2015-12-03 NOTE — Telephone Encounter (Signed)
Appt. 12/21/15 @ 12:00pm with Dr.Rossi, pt aware.

## 2015-12-03 NOTE — Telephone Encounter (Signed)
-----   Message from Ramond Craver, [place] sent at 12/02/2015  3:28 PM EDT ----- Regarding: referral to GYN Onc Per Dr. Moshe Salisbury "Please inform patient that her external biopsy was benign but her vaginal cuff demonstrated VAIN II. Would recommend appointment with Dr. Denman George for recurrent dysplasia (Gyn Oncologist)."  Patient has been informed. She knows you will handle referral and let her know.

## 2015-12-21 ENCOUNTER — Ambulatory Visit: Payer: Medicare Other | Admitting: Gynecologic Oncology

## 2015-12-23 ENCOUNTER — Ambulatory Visit: Payer: Medicare Other | Attending: Gynecologic Oncology | Admitting: Gynecologic Oncology

## 2015-12-23 ENCOUNTER — Encounter: Payer: Self-pay | Admitting: Gynecologic Oncology

## 2015-12-23 VITALS — BP 159/50 | HR 65 | Temp 98.3°F | Resp 18 | Ht 67.0 in | Wt 174.1 lb

## 2015-12-23 DIAGNOSIS — A630 Anogenital (venereal) warts: Secondary | ICD-10-CM

## 2015-12-23 DIAGNOSIS — N891 Moderate vaginal dysplasia: Secondary | ICD-10-CM | POA: Insufficient documentation

## 2015-12-23 DIAGNOSIS — E785 Hyperlipidemia, unspecified: Secondary | ICD-10-CM | POA: Diagnosis not present

## 2015-12-23 DIAGNOSIS — G709 Myoneural disorder, unspecified: Secondary | ICD-10-CM | POA: Diagnosis not present

## 2015-12-23 DIAGNOSIS — Z90710 Acquired absence of both cervix and uterus: Secondary | ICD-10-CM | POA: Diagnosis not present

## 2015-12-23 DIAGNOSIS — I10 Essential (primary) hypertension: Secondary | ICD-10-CM | POA: Diagnosis not present

## 2015-12-23 NOTE — Patient Instructions (Signed)
Plan to have surgery on December 31, 2015 with Dr. [name] at the [facility].  You will be scheduled for a CO2 laser of the vagina.  You will receive a phone call from the pre-surgical RN to discuss instructions.

## 2015-12-23 NOTE — Progress Notes (Signed)
Consult Note: Gyn-Onc  Consult was requested by Dr. Toney Rakes for the evaluation of Laurie Pratt 75 y.o. female  CC:  Chief Complaint  Patient presents with  . VAIN II    New Consultation    Assessment/Plan:  Laurie Pratt  is a 75 y.o.  year old with recurrent dysplasia of the genital tract, this time at the vaginal cuff.  Given that the biopsy shows VAIN 2, I am recommending CO2 ablation. I described the procedure, its risks and anticipated recovery to the patient. I will prescribe vaginal estrogen postoperatively to reduce the risk for scaring. I discussed the high probability for recurrence and the need for future procedures given the relapsing nature of her dysplasia likely secondary to persistent HPV infection.   HPI: Laurie Pratt is a very pleasant 75 year old woman who is seen in consultation at the request of Dr Toney Rakes for VAIN 2. The patient has a history of genital tract dysplasia. In 2014 she underwent a cold left conization which revealed CIN-3 with positive margins. She was then recommended to undergo facial hysterectomy which was performed via laparotomy by Dr. Toney Rakes in 2015. Pathology from her hysterectomy revealed high-grade squamous intraepithelial lesion, CIN 2 and adjacent low-grade squamous intraepithelial lesion. The margins were negative for dysplasia.  Past and was performed as part of routine surveillance on 11/02/2015. It revealed L cell, VAI and 1, with a few cells suspicious for high-grade lesion. She then underwent colposcopic evaluation of the vagina on 11/27/2015. After application of acetic acid, there was a leukoplakic area identified at approximately 3 separate locations across the vaginal cuff which were biopsied these biopsies confirmed the AIN 2.  The patient is otherwise relatively typically healthy with prior medical history of hypertension.   Current Meds:  Outpatient Encounter Prescriptions as of 12/23/2015  Medication Sig  .  atenolol-chlorthalidone (TENORETIC) 100-25 MG tablet Take 1 tablet by mouth every morning.  . conjugated estrogens (PREMARIN) vaginal cream Place 1 Applicatorful vaginally 2 (two) times a week.  . trihexyphenidyl (ARTANE) 2 MG tablet Take 1.5 tablets (3 mg total) by mouth 3 (three) times daily.  Marland Kitchen losartan (COZAAR) 100 MG tablet   . [DISCONTINUED] ciprofloxacin (CIPRO) 250 MG tablet Take 1 tablet (250 mg total) by mouth 2 (two) times daily.  . [DISCONTINUED] conjugated estrogens (PREMARIN) vaginal cream Apply intravaginal twice a week  . [DISCONTINUED] dorzolamide-timolol (COSOPT) 22.3-6.8 MG/ML ophthalmic solution instill 1 drop into left eye twice a day  . [DISCONTINUED] hydrOXYzine (ATARAX/VISTARIL) 25 MG tablet Take 1 tablet (25 mg total) by mouth every 6 (six) hours. For itching (Patient not taking: Reported on 12/23/2015)  . [DISCONTINUED] losartan (COZAAR) 100 MG tablet Take 1 tablet (100 mg total) by mouth daily.  . [DISCONTINUED] metroNIDAZOLE (FLAGYL) 500 MG tablet Take 1 tablet (500 mg total) by mouth 2 (two) times daily.  . [DISCONTINUED] permethrin (ELIMITE) 5 % cream Apply as instructed tonight and repeat in 1 week.   No facility-administered encounter medications on file as of 12/23/2015.    Allergy:  Allergies  Allergen Reactions  . Ace Inhibitors   . Doxazosin Mesylate   . Verapamil     REACTION: legs swelling, tired    Social Hx:   Social History   Social History  . Marital Status: Widowed    Spouse Name: N/A  . Number of Children: 2  . Years of Education: 12   Occupational History  . retired     Social History Main Topics  .  Smoking status: Never Smoker   . Smokeless tobacco: Never Used  . Alcohol Use: No  . Drug Use: No  . Sexual Activity: Yes    Birth Control/ Protection: Surgical   Other Topics Concern  . Not on file   Social History Narrative   Patient is widowed with 2 children   Patient is right handed   Patient has a high school education    Patient drinks 1 cup daily    Past Surgical Hx:  Past Surgical History  Procedure Laterality Date  . Eye surgery      left eye  . Dilation and curettage of uterus    . Cervical conization w/bx N/A 03/12/2013    Procedure: COLD KNIFE CONIZATION CERVIX WITH BIOPSY/EXAM UNDER ANESTHESIA, RECTAL EXAM;  Surgeon: Alvino Chapel, MD;  Location: [facility];  Service: Gynecology;  Laterality: N/A;  . Cervical conization w/bx N/A 03/12/2013    Procedure: CONIZATION CERVIX WITH BIOPSY;  Surgeon: Alvino Chapel, MD;  Location: [facility];  Service: Gynecology;  Laterality: N/A;  patient returned to OR for fulgeration to stop bleeding  . Tubal ligation    . Salpingoophorectomy Bilateral 07/22/2014    Procedure: BILATERAL SALPINGO OOPHORECTOMY;  Surgeon: Terrance Mass, MD;  Location: [facility];  Service: Gynecology;  Laterality: Bilateral;  . Abdominal hysterectomy N/A 07/22/2014    Procedure: HYSTERECTOMY ABDOMINAL;  Surgeon: Terrance Mass, MD;  Location: [facility];  Service: Gynecology;  Laterality: N/A;    Past Medical Hx:  Past Medical History  Diagnosis Date  . Hypertension   . Tics of organic origin     Dr. Doy Mince  . Hyperlipidemia     diet controlled - no meds  . CIN III (cervical intraepithelial neoplasia grade III) with severe dysplasia   . SVD (spontaneous vaginal delivery)     x 3  . H/O colonoscopy   . Neuromuscular disorder [facility])     Past Gynecological History:  S/p hysterectomy for dysplasia No LMP recorded. Patient is postmenopausal.  Family Hx:  Family History  Problem Relation Age of Onset  . Hypertension Other   . Hypertension Mother   . Diabetes Father   . Hypertension Father   . Heart disease Maternal Aunt     CHF    Review of Systems:  Constitutional  Feels well,    ENT Normal appearing ears and nares bilaterally Skin/Breast  No rash, sores, jaundice, itching, dryness Cardiovascular  No chest pain, shortness  of breath, or edema  Pulmonary  No cough or wheeze.  Gastro Intestinal  No nausea, vomitting, or diarrhoea. No bright red blood per rectum, no abdominal pain, change in bowel movement, or constipation.  Genito Urinary  No frequency, urgency, dysuria, no bleeding Musculo Skeletal  No myalgia, arthralgia, joint swelling or pain  Neurologic  No weakness, numbness, change in gait,  Psychology  No depression, anxiety, insomnia.   Vitals:  Blood pressure 159/50, pulse 65, temperature 98.3 F (36.8 C), temperature source Oral, resp. rate 18, height 5\' 7"  (1.702 m), weight 174 lb 1.6 oz (78.971 kg), SpO2 100 %.  Physical Exam: WD in NAD Neck  Supple NROM, without any enlargements.  Lymph Node Survey No cervical supraclavicular or inguinal adenopathy Cardiovascular  Pulse normal rate, regularity and rhythm. S1 and S2 normal.  Lungs  Clear to auscultation bilateraly, without wheezes/crackles/rhonchi. Good air movement.  Skin  No rash/lesions/breakdown  Psychiatry  Alert and oriented to person, place, and time  Abdomen  Normoactive bowel sounds, abdomen soft, non-tender and oveweight without evidence of hernia.  Back No CVA tenderness Genito Urinary  Vulva/vagina: Normal external female genitalia.   No lesions. No discharge or bleeding.  Bladder/urethra:  No lesions or masses, well supported bladder  Vagina: There are changes of acetowhite areas across the vaginal cuff with easy bleeding at biopsy sites.  Cervix: surgically absent.  Uterus: surgically absent   Adnexa: no masses. Rectal  deferred Extremities  No bilateral cyanosis, clubbing or edema.   Donaciano Eva, MD  12/23/2015, 6:31 PM

## 2015-12-28 ENCOUNTER — Encounter (HOSPITAL_BASED_OUTPATIENT_CLINIC_OR_DEPARTMENT_OTHER): Payer: Self-pay | Admitting: *Deleted

## 2015-12-28 NOTE — Progress Notes (Signed)
NPO AFTER MN.  ARRIVE AT 1015.  NEEDS ISTAT AND EKG.  WILL TAKE ARTANE AND LOSARTAN AM DOS W/ SIPS OF WATER .

## 2015-12-31 ENCOUNTER — Ambulatory Visit (HOSPITAL_BASED_OUTPATIENT_CLINIC_OR_DEPARTMENT_OTHER): Payer: Medicare Other | Admitting: Certified Registered"

## 2015-12-31 ENCOUNTER — Other Ambulatory Visit: Payer: Self-pay

## 2015-12-31 ENCOUNTER — Encounter (HOSPITAL_BASED_OUTPATIENT_CLINIC_OR_DEPARTMENT_OTHER): Payer: Self-pay | Admitting: *Deleted

## 2015-12-31 ENCOUNTER — Encounter (HOSPITAL_BASED_OUTPATIENT_CLINIC_OR_DEPARTMENT_OTHER)
Admission: RE | Disposition: A | Discharge status: Home / Self Care | Payer: Self-pay | Source: Ambulatory Visit | Attending: Gynecologic Oncology

## 2015-12-31 ENCOUNTER — Ambulatory Visit (HOSPITAL_BASED_OUTPATIENT_CLINIC_OR_DEPARTMENT_OTHER)
Admission: RE | Admit: 2015-12-31 | Discharge: 2015-12-31 | Disposition: A | Discharge status: Home / Self Care | Payer: Medicare Other | Source: Ambulatory Visit | Attending: Gynecologic Oncology | Admitting: Gynecologic Oncology

## 2015-12-31 DIAGNOSIS — Z90710 Acquired absence of both cervix and uterus: Secondary | ICD-10-CM | POA: Diagnosis not present

## 2015-12-31 DIAGNOSIS — E785 Hyperlipidemia, unspecified: Secondary | ICD-10-CM | POA: Insufficient documentation

## 2015-12-31 DIAGNOSIS — I10 Essential (primary) hypertension: Secondary | ICD-10-CM | POA: Insufficient documentation

## 2015-12-31 DIAGNOSIS — Z86001 Personal history of in-situ neoplasm of cervix uteri: Secondary | ICD-10-CM | POA: Diagnosis not present

## 2015-12-31 DIAGNOSIS — Z79899 Other long term (current) drug therapy: Secondary | ICD-10-CM | POA: Insufficient documentation

## 2015-12-31 DIAGNOSIS — Z9851 Tubal ligation status: Secondary | ICD-10-CM | POA: Diagnosis not present

## 2015-12-31 DIAGNOSIS — Z9079 Acquired absence of other genital organ(s): Secondary | ICD-10-CM | POA: Diagnosis not present

## 2015-12-31 DIAGNOSIS — N891 Moderate vaginal dysplasia: Secondary | ICD-10-CM

## 2015-12-31 DIAGNOSIS — Z90722 Acquired absence of ovaries, bilateral: Secondary | ICD-10-CM | POA: Insufficient documentation

## 2015-12-31 HISTORY — PX: CO2 LASER APPLICATION: SHX5778

## 2015-12-31 HISTORY — DX: Moderate vaginal dysplasia: N89.1

## 2015-12-31 HISTORY — DX: Unspecified glaucoma: H40.9

## 2015-12-31 HISTORY — DX: Presence of spectacles and contact lenses: Z97.3

## 2015-12-31 HISTORY — DX: Personal history of cervical dysplasia: Z87.410

## 2015-12-31 HISTORY — DX: Personal history of colonic polyps: Z86.010

## 2015-12-31 HISTORY — DX: Personal history of colon polyps, unspecified: Z86.0100

## 2015-12-31 LAB — POCT I-STAT 4, (NA,K, GLUC, HGB,HCT)
GLUCOSE: 88 mg/dL (ref 65–99)
HCT: 38 % (ref 36.0–46.0)
Hemoglobin: 12.9 g/dL (ref 12.0–15.0)
POTASSIUM: 3.3 mmol/L — AB (ref 3.5–5.1)
Sodium: 140 mmol/L (ref 135–145)

## 2015-12-31 SURGERY — CO2 LASER APPLICATION
Anesthesia: General | Site: Vagina

## 2015-12-31 MED ORDER — ONDANSETRON HCL 4 MG/2ML IJ SOLN
4.0000 mg | Freq: Once | INTRAMUSCULAR | Status: DC | PRN
  Filled 2015-12-31: qty 2

## 2015-12-31 MED ORDER — LIDOCAINE HCL (CARDIAC) 20 MG/ML IV SOLN
INTRAVENOUS | Status: DC | PRN
  Administered 2015-12-31: 80 mg via INTRAVENOUS

## 2015-12-31 MED ORDER — ONDANSETRON HCL 4 MG/2ML IJ SOLN
INTRAMUSCULAR | Status: AC
  Filled 2015-12-31: qty 2

## 2015-12-31 MED ORDER — DEXAMETHASONE SODIUM PHOSPHATE 10 MG/ML IJ SOLN
INTRAMUSCULAR | Status: AC
  Filled 2015-12-31: qty 1

## 2015-12-31 MED ORDER — PROPOFOL 500 MG/50ML IV EMUL
INTRAVENOUS | Status: DC | PRN
  Administered 2015-12-31: 30 mL via INTRAVENOUS
  Administered 2015-12-31: 170 mL via INTRAVENOUS

## 2015-12-31 MED ORDER — FENTANYL CITRATE (PF) 100 MCG/2ML IJ SOLN
INTRAMUSCULAR | Status: AC
  Filled 2015-12-31: qty 2

## 2015-12-31 MED ORDER — ACETIC ACID 5 % SOLN
Status: DC | PRN
  Administered 2015-12-31: 1 via TOPICAL

## 2015-12-31 MED ORDER — FENTANYL CITRATE (PF) 100 MCG/2ML IJ SOLN
25.0000 ug | INTRAMUSCULAR | Status: DC | PRN
  Filled 2015-12-31: qty 1

## 2015-12-31 MED ORDER — OXYCODONE-ACETAMINOPHEN 5-325 MG PO TABS: 1.0000 | ORAL_TABLET | ORAL | Status: DC | PRN

## 2015-12-31 MED ORDER — ESTROGENS, CONJUGATED 0.625 MG/GM VA CREA: 1.0000 | TOPICAL_CREAM | Freq: Every day | VAGINAL | Status: AC

## 2015-12-31 MED ORDER — LACTATED RINGERS IV SOLN
INTRAVENOUS | Status: DC
  Administered 2015-12-31: 11:00:00 via INTRAVENOUS
  Filled 2015-12-31: qty 1000

## 2015-12-31 MED ORDER — DEXAMETHASONE SODIUM PHOSPHATE 10 MG/ML IJ SOLN
INTRAMUSCULAR | Status: DC | PRN
  Administered 2015-12-31: 10 mg via INTRAVENOUS

## 2015-12-31 MED ORDER — ONDANSETRON HCL 4 MG/2ML IJ SOLN
INTRAMUSCULAR | Status: DC | PRN
  Administered 2015-12-31: 4 mg via INTRAVENOUS

## 2015-12-31 MED ORDER — FENTANYL CITRATE (PF) 100 MCG/2ML IJ SOLN
INTRAMUSCULAR | Status: DC | PRN
  Administered 2015-12-31: 50 ug via INTRAVENOUS

## 2015-12-31 MED ORDER — DOCUSATE SODIUM 100 MG PO CAPS: 100.0000 mg | ORAL_CAPSULE | Freq: Two times a day (BID) | ORAL | Status: DC

## 2015-12-31 SURGICAL SUPPLY — 53 items
APPLICATOR COTTON TIP 6IN STRL (MISCELLANEOUS) ×4 IMPLANT
BAG DRN ANRFLXCHMBR STRAP LEK (BAG)
BAG URINE DRAINAGE (UROLOGICAL SUPPLIES) IMPLANT
BAG URINE LEG 19OZ MD ST LTX (BAG) IMPLANT
BLADE SURG 15 STRL LF DISP TIS (BLADE) IMPLANT
BLADE SURG 15 STRL SS (BLADE)
CANISTER SUCTION 1200CC (MISCELLANEOUS) IMPLANT
CANISTER SUCTION 2500CC (MISCELLANEOUS) IMPLANT
CATH FOLEY 2WAY SLVR  5CC 16FR (CATHETERS)
CATH FOLEY 2WAY SLVR 5CC 16FR (CATHETERS) IMPLANT
CATH ROBINSON RED A/P 16FR (CATHETERS) IMPLANT
CLOTH BEACON ORANGE TIMEOUT ST (SAFETY) ×2 IMPLANT
COVER BACK TABLE 60X90IN (DRAPES) ×2 IMPLANT
DEPRESSOR TONGUE BLADE STERILE (MISCELLANEOUS) ×2 IMPLANT
DRAPE LG THREE QUARTER DISP (DRAPES) IMPLANT
DRAPE UNDERBUTTOCKS STRL (DRAPE) ×2 IMPLANT
DRSG TELFA 3X8 NADH (GAUZE/BANDAGES/DRESSINGS) IMPLANT
ELECT BALL LEEP 3MM BLK (ELECTRODE) IMPLANT
ELECT REM PT RETURN 9FT ADLT (ELECTROSURGICAL) ×2
ELECTRODE REM PT RTRN 9FT ADLT (ELECTROSURGICAL) ×1 IMPLANT
GLOVE BIO SURGEON STRL SZ 6 (GLOVE) ×2 IMPLANT
GLOVE BIOGEL PI IND STRL 7.0 (GLOVE) IMPLANT
GLOVE BIOGEL PI IND STRL 7.5 (GLOVE) IMPLANT
GLOVE BIOGEL PI INDICATOR 7.0 (GLOVE) ×1
GLOVE BIOGEL PI INDICATOR 7.5 (GLOVE) ×1
GLOVE SURG SS PI 7.0 STRL IVOR (GLOVE) ×1 IMPLANT
GOWN STRL REUS W/ TWL LRG LVL3 (GOWN DISPOSABLE) ×2 IMPLANT
GOWN STRL REUS W/TWL LRG LVL3 (GOWN DISPOSABLE) ×4
KIT ROOM TURNOVER WOR (KITS) ×2 IMPLANT
LEGGING LITHOTOMY PAIR STRL (DRAPES) IMPLANT
MANIFOLD NEPTUNE II (INSTRUMENTS) IMPLANT
NDL HYPO 25X1 1.5 SAFETY (NEEDLE) IMPLANT
NEEDLE HYPO 25X1 1.5 SAFETY (NEEDLE) IMPLANT
NS IRRIG 500ML POUR BTL (IV SOLUTION) IMPLANT
PACK BASIN DAY SURGERY FS (CUSTOM PROCEDURE TRAY) ×2 IMPLANT
PAD DRESSING TELFA 3X8 NADH (GAUZE/BANDAGES/DRESSINGS) IMPLANT
PAD OB MATERNITY 4.3X12.25 (PERSONAL CARE ITEMS) ×2 IMPLANT
PAD PREP 24X48 CUFFED NSTRL (MISCELLANEOUS) ×2 IMPLANT
PENCIL BUTTON HOLSTER BLD 10FT (ELECTRODE) IMPLANT
SCOPETTES 8  STERILE (MISCELLANEOUS) ×2
SCOPETTES 8 STERILE (MISCELLANEOUS) ×2 IMPLANT
SUT VIC AB 2-0 SH 27 (SUTURE)
SUT VIC AB 2-0 SH 27XBRD (SUTURE) IMPLANT
SUT VIC AB 3-0 PS2 18 (SUTURE)
SUT VIC AB 3-0 PS2 18XBRD (SUTURE) IMPLANT
SYR CONTROL 10ML LL (SYRINGE) IMPLANT
TOWEL OR 17X24 6PK STRL BLUE (TOWEL DISPOSABLE) ×4 IMPLANT
TRAY DSU PREP LF (CUSTOM PROCEDURE TRAY) ×2 IMPLANT
TUBE CONNECTING 12X1/4 (SUCTIONS) ×2 IMPLANT
VACUUM HOSE 7/8X10 W/ WAND (MISCELLANEOUS) IMPLANT
VACUUM HOSE/TUBING 7/8INX6FT (MISCELLANEOUS) ×1 IMPLANT
WATER STERILE IRR 500ML POUR (IV SOLUTION) ×2 IMPLANT
YANKAUER SUCT BULB TIP NO VENT (SUCTIONS) ×2 IMPLANT

## 2015-12-31 NOTE — Anesthesia Postprocedure Evaluation (Signed)
Anesthesia Post Note  Patient: Laurie Pratt  Procedure(s) Performed: Procedure(s) (LRB): CO2 LASER OF THE VAGINA (N/A)  Patient location during evaluation: PACU Anesthesia Type: General Level of consciousness: awake and alert Pain management: pain level controlled Vital Signs Assessment: post-procedure vital signs reviewed and stable Respiratory status: spontaneous breathing, nonlabored ventilation, respiratory function stable and patient connected to nasal cannula oxygen Cardiovascular status: blood pressure returned to baseline and stable Postop Assessment: no signs of nausea or vomiting Anesthetic complications: no    Last Vitals:  Filed Vitals:   12/31/15 1218 12/31/15 1227  BP: 117/88   Pulse: 55 55  Temp: 35.9 C 36.4 C  Resp: 9 11    Last Pain: There were no vitals filed for this visit.               Zenaida Deed

## 2015-12-31 NOTE — Anesthesia Preprocedure Evaluation (Addendum)
Anesthesia Evaluation  Patient identified by MRN, date of birth, ID band Patient awake    Reviewed: Allergy & Precautions, H&P , NPO status , Patient's Chart, lab work & pertinent test results  Airway Mallampati: II  TM Distance: >3 FB Neck ROM: Full    Dental no notable dental hx. (+) Teeth Intact, Dental Advisory Given   Pulmonary neg pulmonary ROS,    Pulmonary exam normal breath sounds clear to auscultation       Cardiovascular Exercise Tolerance: Good hypertension, Pt. on medications and Pt. on home beta blockers Normal cardiovascular exam Rhythm:Regular Rate:Normal     Neuro/Psych PSYCHIATRIC DISORDERS She takes artane for tremors, but she is not diagnosed with Parkinson's negative neurological ROS     GI/Hepatic negative GI ROS, Neg liver ROS,   Endo/Other  negative endocrine ROS  Renal/GU negative Renal ROS  negative genitourinary   Musculoskeletal negative musculoskeletal ROS (+)   Abdominal   Peds negative pediatric ROS (+)  Hematology negative hematology ROS (+)   Anesthesia Other Findings   Reproductive/Obstetrics negative OB ROS                          Anesthesia Physical  Anesthesia Plan  ASA: II  Anesthesia Plan: General   Post-op Pain Management:    Induction: Intravenous  Airway Management Planned: LMA  Additional Equipment:   Intra-op Plan:   Post-operative Plan: Extubation in OR  Informed Consent: I have reviewed the patients History and Physical, chart, labs and discussed the procedure including the risks, benefits and alternatives for the proposed anesthesia with the patient or authorized representative who has indicated his/her understanding and acceptance.   Dental advisory given  Plan Discussed with: CRNA  Anesthesia Plan Comments:         Anesthesia Quick Evaluation

## 2015-12-31 NOTE — Transfer of Care (Signed)
Immediate Anesthesia Transfer of Care Note  Patient: Laurie Pratt  Procedure(s) Performed: Procedure(s): CO2 LASER OF THE VAGINA (N/A)  Patient Location: PACU  Anesthesia Type:General  Level of Consciousness: awake, alert , oriented and patient cooperative  Airway & Oxygen Therapy: Patient Spontanous Breathing and Patient connected to nasal cannula oxygen  Post-op Assessment: Report given to RN and Post -op Vital signs reviewed and stable  Post vital signs: Reviewed and stable  Last Vitals:  Filed Vitals:   12/31/15 1001  BP: 160/72  Pulse: 55  Temp: 37.1 C  Resp: 16    Last Pain: There were no vitals filed for this visit.       Complications: No apparent anesthesia complications

## 2015-12-31 NOTE — H&P (View-Only) (Signed)
Consult Note: Gyn-Onc  Consult was requested by Dr. Toney Rakes for the evaluation of Laurie Pratt 75 y.o. female  CC:  Chief Complaint  Patient presents with  . VAIN II    New Consultation    Assessment/Plan:  Ms. KIMMIE FOUNDS  is a 75 y.o.  year old with recurrent dysplasia of the genital tract, this time at the vaginal cuff.  Given that the biopsy shows VAIN 2, I am recommending CO2 ablation. I described the procedure, its risks and anticipated recovery to the patient. I will prescribe vaginal estrogen postoperatively to reduce the risk for scaring. I discussed the high probability for recurrence and the need for future procedures given the relapsing nature of her dysplasia likely secondary to persistent HPV infection.   HPI: Laurie Pratt is a very pleasant 75 year old woman who is seen in consultation at the request of Dr Toney Rakes for VAIN 2. The patient has a history of genital tract dysplasia. In 2014 she underwent a cold left conization which revealed CIN-3 with positive margins. She was then recommended to undergo facial hysterectomy which was performed via laparotomy by Dr. Toney Rakes in 2015. Pathology from her hysterectomy revealed high-grade squamous intraepithelial lesion, CIN 2 and adjacent low-grade squamous intraepithelial lesion. The margins were negative for dysplasia.  Past and was performed as part of routine surveillance on 11/02/2015. It revealed L cell, VAI and 1, with a few cells suspicious for high-grade lesion. She then underwent colposcopic evaluation of the vagina on 11/27/2015. After application of acetic acid, there was a leukoplakic area identified at approximately 3 separate locations across the vaginal cuff which were biopsied these biopsies confirmed the AIN 2.  The patient is otherwise relatively typically healthy with prior medical history of hypertension.   Current Meds:  Outpatient Encounter Prescriptions as of 12/23/2015  Medication Sig  .  atenolol-chlorthalidone (TENORETIC) 100-25 MG tablet Take 1 tablet by mouth every morning.  . conjugated estrogens (PREMARIN) vaginal cream Place 1 Applicatorful vaginally 2 (two) times a week.  . trihexyphenidyl (ARTANE) 2 MG tablet Take 1.5 tablets (3 mg total) by mouth 3 (three) times daily.  Marland Kitchen losartan (COZAAR) 100 MG tablet   . [DISCONTINUED] ciprofloxacin (CIPRO) 250 MG tablet Take 1 tablet (250 mg total) by mouth 2 (two) times daily.  . [DISCONTINUED] conjugated estrogens (PREMARIN) vaginal cream Apply intravaginal twice a week  . [DISCONTINUED] dorzolamide-timolol (COSOPT) 22.3-6.8 MG/ML ophthalmic solution instill 1 drop into left eye twice a day  . [DISCONTINUED] hydrOXYzine (ATARAX/VISTARIL) 25 MG tablet Take 1 tablet (25 mg total) by mouth every 6 (six) hours. For itching (Patient not taking: Reported on 12/23/2015)  . [DISCONTINUED] losartan (COZAAR) 100 MG tablet Take 1 tablet (100 mg total) by mouth daily.  . [DISCONTINUED] metroNIDAZOLE (FLAGYL) 500 MG tablet Take 1 tablet (500 mg total) by mouth 2 (two) times daily.  . [DISCONTINUED] permethrin (ELIMITE) 5 % cream Apply as instructed tonight and repeat in 1 week.   No facility-administered encounter medications on file as of 12/23/2015.    Allergy:  Allergies  Allergen Reactions  . Ace Inhibitors   . Doxazosin Mesylate   . Verapamil     REACTION: legs swelling, tired    Social Hx:   Social History   Social History  . Marital Status: Widowed    Spouse Name: N/A  . Number of Children: 2  . Years of Education: 12   Occupational History  . retired     Social History Main Topics  .  Smoking status: Never Smoker   . Smokeless tobacco: Never Used  . Alcohol Use: No  . Drug Use: No  . Sexual Activity: Yes    Birth Control/ Protection: Surgical   Other Topics Concern  . Not on file   Social History Narrative   Patient is widowed with 2 children   Patient is right handed   Patient has a high school education    Patient drinks 1 cup daily    Past Surgical Hx:  Past Surgical History  Procedure Laterality Date  . Eye surgery      left eye  . Dilation and curettage of uterus    . Cervical conization w/bx N/A 03/12/2013    Procedure: COLD KNIFE CONIZATION CERVIX WITH BIOPSY/EXAM UNDER ANESTHESIA, RECTAL EXAM;  Surgeon: Alvino Chapel, MD;  Location: [facility];  Service: Gynecology;  Laterality: N/A;  . Cervical conization w/bx N/A 03/12/2013    Procedure: CONIZATION CERVIX WITH BIOPSY;  Surgeon: Alvino Chapel, MD;  Location: [facility];  Service: Gynecology;  Laterality: N/A;  patient returned to OR for fulgeration to stop bleeding  . Tubal ligation    . Salpingoophorectomy Bilateral 07/22/2014    Procedure: BILATERAL SALPINGO OOPHORECTOMY;  Surgeon: Terrance Mass, MD;  Location: [facility];  Service: Gynecology;  Laterality: Bilateral;  . Abdominal hysterectomy N/A 07/22/2014    Procedure: HYSTERECTOMY ABDOMINAL;  Surgeon: Terrance Mass, MD;  Location: [facility];  Service: Gynecology;  Laterality: N/A;    Past Medical Hx:  Past Medical History  Diagnosis Date  . Hypertension   . Tics of organic origin     Dr. Doy Mince  . Hyperlipidemia     diet controlled - no meds  . CIN III (cervical intraepithelial neoplasia grade III) with severe dysplasia   . SVD (spontaneous vaginal delivery)     x 3  . H/O colonoscopy   . Neuromuscular disorder [facility])     Past Gynecological History:  S/p hysterectomy for dysplasia No LMP recorded. Patient is postmenopausal.  Family Hx:  Family History  Problem Relation Age of Onset  . Hypertension Other   . Hypertension Mother   . Diabetes Father   . Hypertension Father   . Heart disease Maternal Aunt     CHF    Review of Systems:  Constitutional  Feels well,    ENT Normal appearing ears and nares bilaterally Skin/Breast  No rash, sores, jaundice, itching, dryness Cardiovascular  No chest pain, shortness  of breath, or edema  Pulmonary  No cough or wheeze.  Gastro Intestinal  No nausea, vomitting, or diarrhoea. No bright red blood per rectum, no abdominal pain, change in bowel movement, or constipation.  Genito Urinary  No frequency, urgency, dysuria, no bleeding Musculo Skeletal  No myalgia, arthralgia, joint swelling or pain  Neurologic  No weakness, numbness, change in gait,  Psychology  No depression, anxiety, insomnia.   Vitals:  Blood pressure 159/50, pulse 65, temperature 98.3 F (36.8 C), temperature source Oral, resp. rate 18, height 5\' 7"  (1.702 m), weight 174 lb 1.6 oz (78.971 kg), SpO2 100 %.  Physical Exam: WD in NAD Neck  Supple NROM, without any enlargements.  Lymph Node Survey No cervical supraclavicular or inguinal adenopathy Cardiovascular  Pulse normal rate, regularity and rhythm. S1 and S2 normal.  Lungs  Clear to auscultation bilateraly, without wheezes/crackles/rhonchi. Good air movement.  Skin  No rash/lesions/breakdown  Psychiatry  Alert and oriented to person, place, and time  Abdomen  Normoactive bowel sounds, abdomen soft, non-tender and oveweight without evidence of hernia.  Back No CVA tenderness Genito Urinary  Vulva/vagina: Normal external female genitalia.   No lesions. No discharge or bleeding.  Bladder/urethra:  No lesions or masses, well supported bladder  Vagina: There are changes of acetowhite areas across the vaginal cuff with easy bleeding at biopsy sites.  Cervix: surgically absent.  Uterus: surgically absent   Adnexa: no masses. Rectal  deferred Extremities  No bilateral cyanosis, clubbing or edema.   Donaciano Eva, MD  12/23/2015, 6:31 PM

## 2015-12-31 NOTE — Anesthesia Procedure Notes (Signed)
Procedure Name: LMA Insertion Date/Time: 12/31/2015 11:44 AM Performed by: Wanita Chamberlain Pre-anesthesia Checklist: Patient identified, Timeout performed, Emergency Drugs available, Suction available and Patient being monitored Patient Re-evaluated:Patient Re-evaluated prior to inductionOxygen Delivery Method: Circle system utilized Preoxygenation: Pre-oxygenation with 100% oxygen Intubation Type: IV induction Ventilation: Mask ventilation without difficulty LMA: LMA inserted LMA Size: 4.0 Number of attempts: 1 Placement Confirmation: positive ETCO2 and breath sounds checked- equal and bilateral Tube secured with: Tape Dental Injury: Teeth and Oropharynx as per pre-operative assessment

## 2015-12-31 NOTE — Discharge Instructions (Signed)
Vaginal Laser After Care   ACTIVITY  Rest as much as possible the first two days after discharge.  No restrictions on heavy lifting   Do not drive a car for 24 hours  Increase activity gradually.  NUTRITION  You may resume your normal diet.  Drink 6 to 8 glasses of fluids a day.  Eat a healthy, balanced diet including portions of food from the meat (protein), milk, fruit, vegetable, and bread groups.  Your caregiver may recommend you take a multivitamin with iron.  ELIMINATION  You may notice that it burns when you urinate. To minimize this spray water onto your vulva as the urine passes out to dilute the urine. We will provide you with a spray bottle. A regular bottle of tap water can also be used.  Pat the area dry with toilet tissue or towel after voiding urine or stool. Do not wipe.  A hair dryer on the cool setting is also comforting to dry or soothe the area.  If constipation occurs, drink more liquids, and add more fruits, vegetables, and bran to your diet. You may take a mild laxative, such as Milk of Magnesia, Metamucil, or a stool softener such as Colace, with permission from your caregiver. HYGIENE  You may shower and wash your hair.  Avoid tub baths for 4 weeks.  Do not add any bath oils or chemicals to your bath water, after you have permission to take baths.  A sitz bath will help keep your perineal area clean, reduce swelling, and provide comfort.  Avoid wearing underpants for the first 2 weeks and wear loose skirts to allow circulation of air around the laser site  [place]    Avoid activities that involve a lot of friction between your legs.  Avoid wearing pants or underpants in the 1st 2 weeks (skirts are preferable).  Take your temperature twice a day and record it, especially if you feel feverish or have chills.  Follow your caregiver's instructions about medicines, activity, and follow-up appointments after surgery.  Do not  drink alcohol while taking pain medicine.  You may take over-the-counter medicine for pain, recommended by your caregiver.  If your pain is not relieved with medicine, call your caregiver.  Do not douche or use tampons (use a nonperfumed sanitary pad).  Do not have sexual intercourse until your caregiver gives you permission (typically 6 weeks postoperatively). Hugging, kissing, and playful sexual activity is fine with your caregiver's permission.  Warm sitz baths, with your caregiver's permission, are helpful to control swelling and discomfort.  Take showers instead of baths, until your caregiver gives you permission to take baths.  You may take a mild medicine for constipation, recommended by your caregiver. Bran foods and drinking a lot of fluids will help with constipation.  Make sure your family understands everything about your operation and recovery. SEEK MEDICAL CARE IF:   You notice swelling and redness around the wound area.  You notice a foul smell coming from the wound or on the surgical dressing.  You notice the wound is separating.  You have painful or bloody urination.  You develop nausea and vomiting.  You develop diarrhea.  You develop a rash.  You have a reaction or allergy from the medicine.  You feel dizzy or light-headed.  You need stronger pain medicine. SEEK IMMEDIATE MEDICAL CARE IF:   You develop a temperature of 102 F (38.9 C) or higher.  You pass out.  You develop leg or chest pain.  You develop abdominal pain.  You develop shortness of breath.  You develop bleeding from the wound area.  You see pus in the wound area. MAKE SURE YOU:   Understand these instructions.  Will watch your condition.  Will get help right away if you are not doing well or get worse.  Contact Dr Denman George at # [phone]. After hours this line will connect to the after-hours-nurse line which will contact the doctor on call.  Document Released: 02/09/2004  Document Revised: 11/11/2013 Document Reviewed: 05/29/2009 [facility] Patient Information 2015 [place], [place]. This information is not intended to replace advice given to you by your health care provider. Make sure you discuss any questions you have with your health care provider.  Post Anesthesia Home Care Instructions  Activity: Get plenty of rest for the remainder of the day. A responsible adult should stay with you for 24 hours following the procedure.  For the next 24 hours, DO NOT: -Drive a car -Paediatric nurse -Drink alcoholic beverages -Take any medication unless instructed by your physician -Make any legal decisions or sign important papers.  Meals: Start with liquid foods such as gelatin or soup. Progress to regular foods as tolerated. Avoid greasy, spicy, heavy foods. If nausea and/or vomiting occur, drink only clear liquids until the nausea and/or vomiting subsides. Call your physician if vomiting continues.  Special Instructions/Symptoms: Your throat may feel dry or sore from the anesthesia or the breathing tube placed in your throat during surgery. If this causes discomfort, gargle with warm salt water. The discomfort should disappear within 24 hours.  If you had a scopolamine patch placed behind your ear for the management of post- operative nausea and/or vomiting:  1. The medication in the patch is effective for 72 hours, after which it should be removed.  Wrap patch in a tissue and discard in the trash. Wash hands thoroughly with soap and water. 2. You may remove the patch earlier than 72 hours if you experience unpleasant side effects which may include dry mouth, dizziness or visual disturbances. 3. Avoid touching the patch. Wash your hands with soap and water after contact with the patch.

## 2015-12-31 NOTE — Interval H&P Note (Signed)
History and Physical Interval Note:  12/31/2015 11:23 AM  Laurie Pratt  has presented today for surgery, with the diagnosis of VAGINAL INTRAEPITHELIAL NEOPLASIA II  The various methods of treatment have been discussed with the patient and family. After consideration of risks, benefits and other options for treatment, the patient has consented to  Procedure(s): CO2 LASER OF THE VAGINA (N/A) as a surgical intervention .  The patient's history has been reviewed, patient examined, no change in status, stable for surgery.  I have reviewed the patient's chart and labs.  Questions were answered to the patient's satisfaction.     Donaciano Eva

## 2015-12-31 NOTE — Op Note (Signed)
OPERATIVE NOTE  PATIENT: Laurie Pratt DATE: 12/31/15   Preop Diagnosis:   Postoperative Diagnosis:   Surgery: CO2 laser of the vagina  Surgeons:  Donaciano Eva, MD Assistant: none  Anesthesia: General   Estimated blood loss: minimal/none  IVF:  155ml   Urine output: [id number] ml   Complications: None   Pathology: none   Operative findings: acetowhite changes to entire vaginal cuff and anterior and posterior proximal 2cm of vagina. Very atrophic vaginal tissues.  Procedure: The patient was identified in the preoperative holding area. Informed consent was signed on the chart. Patient was seen history was reviewed and exam was performed.   The patient was then taken to the operating room and placed in the supine position with SCD hose on. General anesthesia was then induced without difficulty. She was then placed in the dorsolithotomy position. The perineum was prepped with Betadine. The vagina was prepped with Betadine. The patient was then draped after the prep was dried. A Foley catheter was inserted into the bladder under sterile conditions to drain the bladder then removed.  Timeout was performed the patient, procedure, antibiotic, allergy, and length of procedure. A coated speculum was inserted into the vagina and 5% acetic acid solution was applied to the vagina to identify the lesion.   The patient's surgical field was draped with wet towels. The staff and patient ensured laser-safe eyewear and masks were fitted. The laser was set to 10 watts continuous. The laser was tested for accuracy on a tongue depressor.  The laser was applied to the circumscribed area of the vaginal cuff that had been previously identified. The tissue was ablated to the desired depth and the eschar was removed with a moistened colpostat. When the entire lesion had been ablated the procedure was complete.  Silvadine cream was applied to the laser site.  All instrument, suture,  laparotomy, Ray-Tec, and needle counts were correct x2. The patient tolerated the procedure well and was taken recovery room in stable condition. This is [name] dictating an operative note on Laurie Pratt.  Donaciano Eva, MD

## 2016-01-01 ENCOUNTER — Encounter (HOSPITAL_BASED_OUTPATIENT_CLINIC_OR_DEPARTMENT_OTHER): Payer: Self-pay | Admitting: Gynecologic Oncology

## 2016-01-06 ENCOUNTER — Ambulatory Visit: Payer: Medicare Other | Admitting: Internal Medicine

## 2016-01-15 ENCOUNTER — Encounter: Payer: Self-pay | Admitting: Gynecologic Oncology

## 2016-01-15 ENCOUNTER — Ambulatory Visit: Payer: Medicare Other | Attending: Gynecologic Oncology | Admitting: Gynecologic Oncology

## 2016-01-15 VITALS — BP 133/83 | HR 56 | Temp 98.2°F | Resp 18 | Ht 67.0 in | Wt 172.0 lb

## 2016-01-15 DIAGNOSIS — Z9889 Other specified postprocedural states: Secondary | ICD-10-CM | POA: Diagnosis not present

## 2016-01-15 DIAGNOSIS — Z86010 Personal history of colonic polyps: Secondary | ICD-10-CM | POA: Diagnosis not present

## 2016-01-15 DIAGNOSIS — K6282 Dysplasia of anus: Secondary | ICD-10-CM | POA: Insufficient documentation

## 2016-01-15 DIAGNOSIS — I10 Essential (primary) hypertension: Secondary | ICD-10-CM | POA: Diagnosis not present

## 2016-01-15 DIAGNOSIS — Z90710 Acquired absence of both cervix and uterus: Secondary | ICD-10-CM | POA: Diagnosis not present

## 2016-01-15 DIAGNOSIS — E785 Hyperlipidemia, unspecified: Secondary | ICD-10-CM | POA: Insufficient documentation

## 2016-01-15 DIAGNOSIS — N891 Moderate vaginal dysplasia: Secondary | ICD-10-CM | POA: Diagnosis not present

## 2016-01-15 DIAGNOSIS — H409 Unspecified glaucoma: Secondary | ICD-10-CM | POA: Diagnosis not present

## 2016-01-15 NOTE — Progress Notes (Signed)
Post-operative Follow-up Note: Gyn-Onc  Consult was initially requested by Dr. Toney Rakes for the evaluation of Laurie Pratt 75 y.o. female  CC:  Chief Complaint  Patient presents with  . Vaginal intraepithelial neoplasia grade II   ( VAIN II )    Post-op follow up visit    Assessment/Plan:  Ms. Laurie Pratt  is a 75 y.o.  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  S/p CO2 laser of the vagina on6/22/17.  She is healing well from laser.  I discussed the high probability of recurrence of the dysplasia due to the underlying HPV related disease. I recommend repeat pap with Dr Toney Rakes in 6 months. I will see her back if she develops high grade vaginal dysplasia again and requires repeat procedure.  HPI: Laurie Pratt is a very pleasant 75 year old woman who is seen in consultation at the request of Dr Toney Rakes for VAIN 2. The patient has a history of genital tract dysplasia. In 2014 she underwent a cold left conization which revealed CIN-3 with positive margins. She was then recommended to undergo facial hysterectomy which was performed via laparotomy by Dr. Toney Rakes in 2015. Pathology from her hysterectomy revealed high-grade squamous intraepithelial lesion, CIN 2 and adjacent low-grade squamous intraepithelial lesion. The margins were negative for dysplasia.  Past and was performed as part of routine surveillance on 11/02/2015. It revealed L cell, VAI and 1, with a few cells suspicious for high-grade lesion. She then underwent colposcopic evaluation of the vagina on 11/27/2015. After application of acetic acid, there was a leukoplakic area identified at approximately 3 separate locations across the vaginal cuff which were biopsied these biopsies confirmed the AIN 2.  The patient is otherwise relatively typically healthy with prior medical history of hypertension.   Interval Hx:  On 12/31/15 she underwent CO2 laser ablation of  the vaginal cuff VAIN II. The procedure was uncomplicated. No pathology was retrieved from the procedure. She has no postop issues.  Current Meds:  Outpatient Encounter Prescriptions as of 01/15/2016  Medication Sig  . atenolol-chlorthalidone (TENORETIC) 100-25 MG tablet Take 1 tablet by mouth every morning.  . Cholecalciferol (VITAMIN D3) 1000 units CAPS Take 1 capsule by mouth every morning.  . conjugated estrogens (PREMARIN) vaginal cream Place 1 Applicatorful vaginally 2 (two) times a week.  . docusate sodium (COLACE) 100 MG capsule Take 1 capsule (100 mg total) by mouth 2 (two) times daily.  Marland Kitchen latanoprost (XALATAN) 0.005 % ophthalmic solution Place 1 drop into both eyes at bedtime.  Marland Kitchen losartan (COZAAR) 100 MG tablet Take 100 mg by mouth every morning.   . trihexyphenidyl (ARTANE) 2 MG tablet Take 1.5 tablets (3 mg total) by mouth 3 (three) times daily.  . [DISCONTINUED] oxyCODONE-acetaminophen (PERCOCET) 5-325 MG tablet Take 1-2 tablets by mouth every 4 (four) hours as needed for severe pain.   No facility-administered encounter medications on file as of 01/15/2016.    Allergy:  Allergies  Allergen Reactions  . Ace Inhibitors Other (See Comments)    cough  . Doxazosin Mesylate     Unknown reaction  . Verapamil Swelling    REACTION: legs swelling, tired    Social Hx:   Social History   Social History  . Marital Status: Widowed    Spouse Name: N/A  . Number of Children: 2  . Years of Education: 12   Occupational History  . retired     Social History Main  Topics  . Smoking status: Never Smoker   . Smokeless tobacco: Never Used  . Alcohol Use: No  . Drug Use: No  . Sexual Activity: Not on file   Other Topics Concern  . Not on file   Social History Narrative   Patient is widowed with 2 children   Patient is right handed   Patient has a high school education   Patient drinks 1 cup daily    Past Surgical Hx:  Past Surgical History  Procedure Laterality Date  .  Dilation and curettage of uterus    . Cervical conization w/bx N/A 03/12/2013    Procedure: COLD KNIFE CONIZATION CERVIX WITH BIOPSY/EXAM UNDER ANESTHESIA, RECTAL EXAM;  Surgeon: Alvino Chapel, MD;  Location: [facility];  Service: Gynecology;  Laterality: N/A;  . Cervical conization w/bx N/A 03/12/2013    Procedure: CONIZATION CERVIX WITH BIOPSY;  Surgeon: Alvino Chapel, MD;  Location: [facility];  Service: Gynecology;  Laterality: N/A;  patient returned to OR for fulgeration to stop bleeding  . Tubal ligation    . Salpingoophorectomy Bilateral 07/22/2014    Procedure: BILATERAL SALPINGO OOPHORECTOMY;  Surgeon: Terrance Mass, MD;  Location: [facility];  Service: Gynecology;  Laterality: Bilateral;  . Abdominal hysterectomy N/A 07/22/2014    Procedure: HYSTERECTOMY ABDOMINAL;  Surgeon: Terrance Mass, MD;  Location: [facility];  Service: Gynecology;  Laterality: N/A;  . Cataract extraction w/ intraocular lens implant Left 2014  . Colonoscopy  02-23-2010  . Co2 laser application N/A [id number]    Procedure: CO2 LASER OF THE VAGINA;  Surgeon: [name], MD;  Location: [facility];  Service: Gynecology;  Laterality: N/A;    Past Medical Hx:  Past Medical History  Diagnosis Date  . Hypertension   . Tics of organic origin     Dr. Doy Mince  . Hyperlipidemia     diet controlled - no meds  . Neuromuscular disorder ([place])   . History of cervical dysplasia     CIN III  w Severe dysplasia  s/p  cervical cone bx 09/ 2014  . VAIN II (vaginal intraepithelial neoplasia grade II)   . Glaucoma   . Wears glasses   . History of colon polyps     hyperplastic 08/ 2011    Past Gynecological History:  S/p hysterectomy for dysplasia No LMP recorded. Patient is postmenopausal.  Family Hx:  Family History  Problem Relation Age of Onset  . Hypertension Other   . Hypertension Mother   . Diabetes Father   . Hypertension Father   . Heart disease  Maternal Aunt     CHF    Review of Systems:  Constitutional  Feels well,    ENT Normal appearing ears and nares bilaterally Skin/Breast  No rash, sores, jaundice, itching, dryness Cardiovascular  No chest pain, shortness of breath, or edema  Pulmonary  No cough or wheeze.  Gastro Intestinal  No nausea, vomitting, or diarrhoea. No bright red blood per rectum, no abdominal pain, change in bowel movement, or constipation.  Genito Urinary  No frequency, urgency, dysuria, no bleeding Musculo Skeletal  No myalgia, arthralgia, joint swelling or pain  Neurologic  No weakness, numbness, change in gait,  Psychology  No depression, anxiety, insomnia.   Vitals:  Blood pressure 133/83, pulse 56, temperature 98.2 F (36.8 C), temperature source Oral, resp. rate 18, height 5\' 7"  (1.702 m), weight 172 lb (78.019 kg), SpO2 100 %.  Physical Exam: WD in NAD  Neck  Supple NROM, without any enlargements.  Lymph Node Survey No cervical supraclavicular or inguinal adenopathy Cardiovascular  Pulse normal rate, regularity and rhythm. S1 and S2 normal.  Lungs  Clear to auscultation bilateraly, without wheezes/crackles/rhonchi. Good air movement.  Skin  No rash/lesions/breakdown  Psychiatry  Alert and oriented to person, place, and time  Abdomen  Normoactive bowel sounds, abdomen soft, non-tender and oveweight without evidence of hernia.  Back No CVA tenderness Genito Urinary  Vulva/vagina: Normal external female genitalia.   No lesions. No discharge or bleeding.  Bladder/urethra:  No lesions or masses, well supported bladder  Vagina: laser site healing well. Friable and bleeds to touch.  Cervix: surgically absent.  Uterus: surgically absent   Adnexa: no masses. Rectal  deferred Extremities  No bilateral cyanosis, clubbing or edema.   Donaciano Eva, MD  01/15/2016, 2:22 PM

## 2016-01-15 NOTE — Patient Instructions (Signed)
Plan to follow up with Dr. Toney Rakes in six months with a repeat pap at that time.  Please call for any questions or concerns.

## 2016-01-25 ENCOUNTER — Encounter: Payer: Self-pay | Admitting: Neurology

## 2016-01-25 ENCOUNTER — Ambulatory Visit (INDEPENDENT_AMBULATORY_CARE_PROVIDER_SITE_OTHER): Payer: Medicare Other | Admitting: Neurology

## 2016-01-25 VITALS — BP 166/76 | HR 58 | Ht 67.0 in | Wt 174.0 lb

## 2016-01-25 DIAGNOSIS — R259 Unspecified abnormal involuntary movements: Secondary | ICD-10-CM | POA: Diagnosis not present

## 2016-01-25 DIAGNOSIS — F959 Tic disorder, unspecified: Secondary | ICD-10-CM

## 2016-01-25 MED ORDER — TRIHEXYPHENIDYL HCL 2 MG PO TABS: 3.0000 mg | ORAL_TABLET | Freq: Three times a day (TID) | ORAL | Status: DC

## 2016-01-25 NOTE — Progress Notes (Signed)
Chief Complaint  Patient presents with  . Abnormal Involuntary Movements    She is here for her yearly follow up appt.  No new concerns today.  Feels she is doing will on her current dose of Artane.     [facility]  PATIENT: Laurie Pratt   HISTORY OF PRESENT ILLNESS:Laurie Pratt, 75 year old female returns for followup. She was last seen 01/30/2014. She has a history of Meige's syndrome presenting primarily with blepharospasm and facial dyskinesias. She has been followed here since 2001 by Dr. Doy Mince and now Dr. Krista Blue. She had probable cervical dystonia in the past but that has not been a recent problem. She underwent Botox injections in July 2004 and May 2007 for blepharospasm without significant improvement.  She also takes Artane 2mg  one and 1/2 tab tid, which is beneficial. Her symptoms have been stable since last seen. She denies any side effects to her medication. On exam today she has  frequent pursing and smiling movements of the lips, good voluntary movement in the face tongue and palate. She denies gait difficulties she returns for reevaluation.   UPDATE July 17th 2017:  She continue have frequent forceful eye blinking, mouth purge movement even with Artane 2 mg one and half tablets 3 times a day, she has tried Botox injection many years ago with limited result. After discussed with patient, she is willing to try it again  REVIEW OF SYSTEMS: Full 14 system review of systems performed and notable only for those listed, all others are neg:      ALLERGIES: Allergies  Allergen Reactions  . Ace Inhibitors Other (See Comments)    cough  . Doxazosin Mesylate     Unknown reaction  . Verapamil Swelling    REACTION: legs swelling, tired    HOME MEDICATIONS: Outpatient Prescriptions Prior to Visit  Medication Sig Dispense Refill  . atenolol-chlorthalidone (TENORETIC) 100-25 MG tablet Take 1 tablet by mouth every morning. 90 tablet 3  . Cholecalciferol  (VITAMIN D3) 1000 units CAPS Take 1 capsule by mouth every morning.    . conjugated estrogens (PREMARIN) vaginal cream Place 1 Applicatorful vaginally 2 (two) times a week.    . docusate sodium (COLACE) 100 MG capsule Take 1 capsule (100 mg total) by mouth 2 (two) times daily. 10 capsule 0  . latanoprost (XALATAN) 0.005 % ophthalmic solution Place 1 drop into both eyes at bedtime.    Marland Kitchen losartan (COZAAR) 100 MG tablet Take 100 mg by mouth every morning.   0  . trihexyphenidyl (ARTANE) 2 MG tablet Take 1.5 tablets (3 mg total) by mouth 3 (three) times daily. 140 tablet 11   No facility-administered medications prior to visit.    PAST MEDICAL HISTORY: Past Medical History  Diagnosis Date  . Hypertension   . Tics of organic origin     Dr. Doy Mince  . Hyperlipidemia     diet controlled - no meds  . Neuromuscular disorder ([place])   . History of cervical dysplasia     CIN III  w Severe dysplasia  s/p  cervical cone bx 09/ 2014  . VAIN II (vaginal intraepithelial neoplasia grade II)   . Glaucoma   . Wears glasses   . History of colon polyps     hyperplastic 08/ 2011    PAST SURGICAL HISTORY: Past Surgical History  Procedure Laterality Date  . Dilation and curettage of uterus    . Cervical conization w/bx N/A 03/12/2013    Procedure: COLD KNIFE CONIZATION CERVIX WITH  BIOPSY/EXAM UNDER ANESTHESIA, RECTAL EXAM;  Surgeon: Alvino Chapel, MD;  Location: [facility];  Service: Gynecology;  Laterality: N/A;  . Cervical conization w/bx N/A 03/12/2013    Procedure: CONIZATION CERVIX WITH BIOPSY;  Surgeon: Alvino Chapel, MD;  Location: [facility];  Service: Gynecology;  Laterality: N/A;  patient returned to OR for fulgeration to stop bleeding  . Tubal ligation    . Salpingoophorectomy Bilateral 07/22/2014    Procedure: BILATERAL SALPINGO OOPHORECTOMY;  Surgeon: Terrance Mass, MD;  Location: [facility];  Service: Gynecology;  Laterality: Bilateral;  .  Abdominal hysterectomy N/A 07/22/2014    Procedure: HYSTERECTOMY ABDOMINAL;  Surgeon: Terrance Mass, MD;  Location: [facility];  Service: Gynecology;  Laterality: N/A;  . Cataract extraction w/ intraocular lens implant Left 2014  . Colonoscopy  02-23-2010  . Co2 laser application N/A [id number]    Procedure: CO2 LASER OF THE VAGINA;  Surgeon: [name], MD;  Location: [facility];  Service: Gynecology;  Laterality: N/A;    FAMILY HISTORY: Family History  Problem Relation Age of Onset  . Hypertension Other   . Hypertension Mother   . Diabetes Father   . Hypertension Father   . Heart disease Maternal Aunt     CHF    SOCIAL HISTORY: Social History   Social History  . Marital Status: Widowed    Spouse Name: N/A  . Number of Children: 2  . Years of Education: 12   Occupational History  . retired     Social History Main Topics  . Smoking status: Never Smoker   . Smokeless tobacco: Never Used  . Alcohol Use: No  . Drug Use: No  . Sexual Activity: Not on file   Other Topics Concern  . Not on file   Social History Narrative   Patient is widowed with 2 children   Patient is right handed   Patient has a high school education   Patient drinks 1 cup daily     PHYSICAL EXAM  Filed Vitals:   01/25/16 1129  BP: 166/76  Pulse: 58  Height: 5\' 7"  (1.702 m)  Weight: 174 lb (78.926 kg)   Body mass index is 27.25 kg/(m^2). General: well developed, well nourished, seated, in no evident distress Head: head normocephalic and atraumatic. Oropharynx benign. Neck: supple with no carotid bruits. Neurologic Exam  Mental Status: Awake and fully alert. Oriented to place and time. Follows all commands. Mood and affect appropriate. Cranial Nerves: Pupils equal, briskly reactive to light. Extraocular movements full without nystagmus. Visual fields full to confrontation. Hearing intact and symmetric to finger snap. Facial sensation intact. Face, tongue, palate  move normally and symmetrically. Neck flexion and extension normal. Infrequent pursing and smiling movements of the lips. Rare blepharospasm noted.  Motor: Normal bulk and tone. Normal strength in all tested extremity muscles. Sensory: intact to light touch. Coordination: Rapid alternating movements normal in all extremities. Finger-to-nose and heel-to-shin performed accurately bilaterally. Gait and Station: Arises from chair without difficulty. Stance is normal. Tandem gait is normal Reflexes: 1+ and symmetric. Toes downgoing.   DIAGNOSTIC DATA (LABS, IMAGING, TESTING) - I reviewed patient records, labs, notes, testing and imaging myself where available.  Lab Results  Component Value Date   WBC 6.7 07/08/2015   HGB 12.9 12/31/2015   HCT 38.0 12/31/2015   MCV 87.6 07/08/2015   PLT 332.0 07/08/2015      Component Value Date/Time   NA 140 12/31/2015 1113  K 3.3* 12/31/2015 1113   CL 104 07/08/2015 1551   CO2 28 07/08/2015 1551   GLUCOSE 88 12/31/2015 1113   BUN 22 07/08/2015 1551   CREATININE 1.10 07/08/2015 1551   CALCIUM 10.3 07/08/2015 1551   PROT 7.9 07/08/2015 1551   ALBUMIN 4.0 07/08/2015 1551   AST 19 07/08/2015 1551   ALT 16 07/08/2015 1551   ALKPHOS 85 07/08/2015 1551   BILITOT 0.4 07/08/2015 1551   GFRNONAA 45* 07/23/2014 0525   GFRAA 52* 07/23/2014 0525    ASSESSMENT AND PLAN  75 y.o. year old female  Blepharospasm  EMG guided xeomin injection, asking for 50 units,  Preauthorization paperwork today  Refilled her artane   Marcial Pacas, M.D. Ph.D.  [facility] [place], [place] [address] Phone: [phone] Fax:      [phone]

## 2016-03-07 ENCOUNTER — Encounter (INDEPENDENT_AMBULATORY_CARE_PROVIDER_SITE_OTHER): Payer: Self-pay | Admitting: Nurse Practitioner

## 2016-03-07 ENCOUNTER — Encounter: Payer: Self-pay | Admitting: Nurse Practitioner

## 2016-03-07 DIAGNOSIS — Z0289 Encounter for other administrative examinations: Secondary | ICD-10-CM

## 2016-03-07 NOTE — Progress Notes (Signed)
This encounter was created in error - please disregard.

## 2016-03-24 ENCOUNTER — Encounter: Payer: Self-pay | Admitting: Neurology

## 2016-03-24 ENCOUNTER — Ambulatory Visit (INDEPENDENT_AMBULATORY_CARE_PROVIDER_SITE_OTHER): Payer: Medicare Other | Admitting: Neurology

## 2016-03-24 ENCOUNTER — Encounter: Payer: Self-pay | Admitting: *Deleted

## 2016-03-24 VITALS — BP 129/75 | HR 70 | Ht 67.0 in | Wt 174.5 lb

## 2016-03-24 DIAGNOSIS — G5139 Clonic hemifacial spasm, unspecified: Secondary | ICD-10-CM | POA: Insufficient documentation

## 2016-03-24 DIAGNOSIS — G245 Blepharospasm: Secondary | ICD-10-CM

## 2016-03-24 MED ORDER — INCOBOTULINUMTOXINA 50 UNITS IM SOLR
50.0000 [IU] | INTRAMUSCULAR | Status: DC
  Administered 2016-03-24: 50 [IU] via INTRAMUSCULAR

## 2016-03-24 NOTE — Progress Notes (Signed)
Chief Complaint  Patient presents with  . Blepharospasm/Facial Spasm    Xeomin 50 units - office supply     [facility]  PATIENT: Laurie Pratt   HISTORY OF PRESENT ILLNESS:Laurie Pratt, 75 year old female returns for followup. She was last seen 01/30/2014. She has a history of Meige's syndrome presenting primarily with blepharospasm and facial dyskinesias. She has been a patient of [facility] since 2010, previously by Dr. Tyron Russell. She had probable cervical dystonia in the past but that has not been a recent problem. She underwent Botox injections in July 2004 and May 2007 for blepharospasm without significant improvement.  She also takes Artane 2mg  one and 1/2 tab tid, which is beneficial. Her symptoms have been stable since last seen. She denies any side effects to her medication. On exam today she has  frequent pursing and smiling movements of the lips, good voluntary movement in the face tongue and palate. She denies gait difficulties she returns for reevaluation.   UPDATE July 17th 2017:  She continue have frequent forceful eye blinking, mouth purge movement even with Artane 2 mg one and half tablets 3 times a day, she has tried Botox injection many years ago with limited result. After discussed with patient, she is willing to try it again  UPDATE Sept 14 2017: She came in for EMG guided Xeomin injection, used 50 units, she had almost constant bilateral eyes blinking, mouth perking movement.  REVIEW OF SYSTEMS: Full 14 system review of systems performed and notable only for those listed, all others are neg:      ALLERGIES: Allergies  Allergen Reactions  . Ace Inhibitors Other (See Comments)    cough  . Doxazosin Mesylate     Unknown reaction  . Verapamil Swelling    REACTION: legs swelling, tired    HOME MEDICATIONS: Outpatient Medications Prior to Visit  Medication Sig Dispense Refill  . atenolol-chlorthalidone (TENORETIC) 100-25 MG tablet Take 1 tablet by  mouth every morning. 90 tablet 3  . Cholecalciferol (VITAMIN D3) 1000 units CAPS Take 1 capsule by mouth every morning.    . conjugated estrogens (PREMARIN) vaginal cream Place 1 Applicatorful vaginally 2 (two) times a week.    . latanoprost (XALATAN) 0.005 % ophthalmic solution Place 1 drop into both eyes at bedtime.    Marland Kitchen losartan (COZAAR) 100 MG tablet Take 100 mg by mouth every morning.   0  . trihexyphenidyl (ARTANE) 2 MG tablet Take 1.5 tablets (3 mg total) by mouth 3 (three) times daily. 150 tablet 4   No facility-administered medications prior to visit.     PAST MEDICAL HISTORY: Past Medical History:  Diagnosis Date  . Glaucoma   . History of cervical dysplasia    CIN III  w Severe dysplasia  s/p  cervical cone bx 09/ 2014  . History of colon polyps    hyperplastic 08/ 2011  . Hyperlipidemia    diet controlled - no meds  . Hypertension   . Neuromuscular disorder ([place])   . Tics of organic origin    Dr. Doy Mince  . VAIN II (vaginal intraepithelial neoplasia grade II)   . Wears glasses     PAST SURGICAL HISTORY: Past Surgical History:  Procedure Laterality Date  . ABDOMINAL HYSTERECTOMY N/A 07/22/2014   Procedure: HYSTERECTOMY ABDOMINAL;  Surgeon: Terrance Mass, MD;  Location: [facility];  Service: Gynecology;  Laterality: N/A;  . CATARACT EXTRACTION W/ INTRAOCULAR LENS IMPLANT Left 2014  . CERVICAL CONIZATION W/BX N/A 03/12/2013   Procedure:  COLD KNIFE CONIZATION CERVIX WITH BIOPSY/EXAM UNDER ANESTHESIA, RECTAL EXAM;  Surgeon: Alvino Chapel, MD;  Location: [facility];  Service: Gynecology;  Laterality: N/A;  . CERVICAL CONIZATION W/BX N/A 03/12/2013   Procedure: CONIZATION CERVIX WITH BIOPSY;  Surgeon: Alvino Chapel, MD;  Location: [facility];  Service: Gynecology;  Laterality: N/A;  patient returned to OR for fulgeration to stop bleeding  . CO2 LASER APPLICATION N/A 1/61/0960   Procedure: CO2 LASER OF THE VAGINA;  Surgeon:  [name], MD;  Location: [facility];  Service: Gynecology;  Laterality: N/A;  . COLONOSCOPY  02-23-2010  . DILATION AND CURETTAGE OF UTERUS    . SALPINGOOPHORECTOMY Bilateral 07/22/2014   Procedure: BILATERAL SALPINGO OOPHORECTOMY;  Surgeon: Terrance Mass, MD;  Location: [facility];  Service: Gynecology;  Laterality: Bilateral;  . TUBAL LIGATION      FAMILY HISTORY: Family History  Problem Relation Age of Onset  . Hypertension Mother   . Diabetes Father   . Hypertension Father   . Hypertension Other   . Heart disease Maternal Aunt     CHF    SOCIAL HISTORY: Social History   Social History  . Marital status: Widowed    Spouse name: N/A  . Number of children: 2  . Years of education: 12   Occupational History  . retired     Social History Main Topics  . Smoking status: Never Smoker  . Smokeless tobacco: Never Used  . Alcohol use No  . Drug use: No  . Sexual activity: Not on file   Other Topics Concern  . Not on file   Social History Narrative   Patient is widowed with 2 children   Patient is right handed   Patient has a high school education   Patient drinks 1 cup daily     PHYSICAL EXAM  Vitals:   03/24/16 1444  BP: 129/75  Pulse: 70  Weight: 174 lb 8 oz (79.2 kg)  Height: 5\' 7"  (1.702 m)   Body mass index is 27.33 kg/m. General: well developed, well nourished, seated, in no evident distress Head: head normocephalic and atraumatic. Oropharynx benign. Neck: supple with no carotid bruits. Neurologic Exam  Mental Status: Awake and fully alert. Oriented to place and time. Follows all commands. Mood and affect appropriate. Cranial Nerves: Pupils equal, briskly reactive to light. Extraocular movements full without nystagmus. Visual fields full to confrontation. Hearing intact and symmetric to finger snap. Facial sensation intact. Face, tongue, palate move normally and symmetrically. Neck flexion and extension normal. Infrequent  pursing and smiling movements of the lips. Rare blepharospasm noted.  Motor: Normal bulk and tone. Normal strength in all tested extremity muscles. Sensory: intact to light touch. Coordination: Rapid alternating movements normal in all extremities. Finger-to-nose and heel-to-shin performed accurately bilaterally. Gait and Station: Arises from chair without difficulty. Stance is normal. Tandem gait is normal Reflexes: 1+ and symmetric. Toes downgoing.   DIAGNOSTIC DATA (LABS, IMAGING, TESTING) - I reviewed patient records, labs, notes, testing and imaging myself where available.  Lab Results  Component Value Date   WBC 6.7 07/08/2015   HGB 12.9 12/31/2015   HCT 38.0 12/31/2015   MCV 87.6 07/08/2015   PLT 332.0 07/08/2015      Component Value Date/Time   NA 140 12/31/2015 1113   K 3.3 (L) 12/31/2015 1113   CL 104 07/08/2015 1551   CO2 28 07/08/2015 1551   GLUCOSE 88 12/31/2015 1113   BUN 22  07/08/2015 1551   CREATININE 1.10 07/08/2015 1551   CALCIUM 10.3 07/08/2015 1551   PROT 7.9 07/08/2015 1551   ALBUMIN 4.0 07/08/2015 1551   AST 19 07/08/2015 1551   ALT 16 07/08/2015 1551   ALKPHOS 85 07/08/2015 1551   BILITOT 0.4 07/08/2015 1551   GFRNONAA 45 (L) 07/23/2014 0525   GFRAA 52 (L) 07/23/2014 0525    ASSESSMENT AND PLAN  75 y.o. year old female  Blepharospasm  EMG guided xeomin injection,  Used to 50 units, dissolved in 1 cc normal saline  Right orbicularis ocular at 4, 6 8, 9,= 10 units total Left orbicularis oculi at 2, 3, 6, 8, 2.5 units in each= 10 units total  Right corrugate 5 units Left corrugate 5 units Procerus 5 units   Right zygomatic major 2.5 units Left zygomatic major 2.5 units  Right nasalis 5 units Left nasalis 5 units  She tolerated the injection well will return to clinic in 3 months for repeat injection   Marcial Pacas, M.D. Ph.D.  [facility] [place], [place] [address] Phone: [phone] Fax:       [phone]

## 2016-03-24 NOTE — Progress Notes (Signed)
**  Xeomin 50 units x 1 vial, NDC 6854-8830-14, Lot [id number], Exp 03/2017, office supply//mck,rn.**

## 2016-06-17 ENCOUNTER — Other Ambulatory Visit: Payer: Self-pay

## 2016-06-23 ENCOUNTER — Telehealth: Payer: Self-pay | Admitting: Neurology

## 2016-06-23 ENCOUNTER — Encounter: Payer: Self-pay | Admitting: Neurology

## 2016-06-23 ENCOUNTER — Ambulatory Visit (INDEPENDENT_AMBULATORY_CARE_PROVIDER_SITE_OTHER): Payer: Medicare Other | Admitting: Neurology

## 2016-06-23 VITALS — BP 156/70 | HR 65 | Ht 67.0 in | Wt 177.5 lb

## 2016-06-23 DIAGNOSIS — G245 Blepharospasm: Secondary | ICD-10-CM

## 2016-06-23 DIAGNOSIS — F419 Anxiety disorder, unspecified: Secondary | ICD-10-CM | POA: Insufficient documentation

## 2016-06-23 MED ORDER — INCOBOTULINUMTOXINA 50 UNITS IM SOLR
50.0000 [IU] | INTRAMUSCULAR | Status: DC
  Administered 2016-06-23: 50 [IU] via INTRAMUSCULAR

## 2016-06-23 MED ORDER — TRIHEXYPHENIDYL HCL 2 MG PO TABS: 3.0000 mg | ORAL_TABLET | Freq: Three times a day (TID) | ORAL | 6 refills | Status: DC

## 2016-06-23 MED ORDER — SERTRALINE HCL 50 MG PO TABS: 50.0000 mg | ORAL_TABLET | Freq: Every day | ORAL | 11 refills | Status: DC

## 2016-06-23 NOTE — Progress Notes (Signed)
**  Xeomin 50 units, Lot [id number], Exp 03/2017, [place] 1540-0867-61, office supply.//mck,rn**

## 2016-06-23 NOTE — Telephone Encounter (Signed)
Noted in chart.

## 2016-06-23 NOTE — Telephone Encounter (Signed)
Pt needs 3 month Xeomin appointment.

## 2016-06-23 NOTE — Telephone Encounter (Signed)
Please do not dissovle xeomin at her next followup, until I discuss with her again

## 2016-06-23 NOTE — Progress Notes (Signed)
Chief Complaint  Patient presents with  . Blepharospasm/Hemifacial Spasm    Xeomin 50 units - office supply     [facility]  PATIENT: Laurie Pratt   HISTORY OF PRESENT ILLNESS:Ms. Laurie Pratt, 75 year old female returns for followup. She was last seen 01/30/2014. She has a history of Meige's syndrome presenting primarily with blepharospasm and facial dyskinesias. She has been a patient of [facility] since 2010, previously by Dr. Tyron Pratt. She had probable cervical dystonia in the past but that has not been a recent problem. She underwent Botox injections in July 2004 and May 2007 for blepharospasm without significant improvement.  She also takes Artane 2mg  one and 1/2 tab tid, which is beneficial. Her symptoms have been stable since last seen. She denies any side effects to her medication. On exam today she has  frequent pursing and smiling movements of the lips, good voluntary movement in the face tongue and palate. She denies gait difficulties she returns for reevaluation.   UPDATE July 17th 2017:  She continue have frequent forceful eye blinking, mouth purge movement even with Artane 2 mg one and half tablets 3 times a day, she has tried Botox injection many years ago with limited result. After discussed with patient, she is willing to try it again  UPDATE Sept 14th 2017: She came in for EMG guided Xeomin injection, used 50 units, she had almost constant bilateral eyes blinking, mouth perking movement.  UPDATE Dec 14th 2017: She reported limited response to previous injection in Sept 2017, she complains of anxiety,taking Artane 2 mg 3 times a day.  REVIEW OF SYSTEMS: Full 14 system review of systems performed and notable only for those listed, all others are neg:      ALLERGIES: Allergies  Allergen Reactions  . Ace Inhibitors Other (See Comments)    cough  . Doxazosin Mesylate     Unknown reaction  . Verapamil Swelling    REACTION: legs swelling, tired    HOME  MEDICATIONS: Outpatient Medications Prior to Visit  Medication Sig Dispense Refill  . atenolol-chlorthalidone (TENORETIC) 100-25 MG tablet Take 1 tablet by mouth every morning. 90 tablet 3  . Cholecalciferol (VITAMIN D3) 1000 units CAPS Take 1 capsule by mouth every morning.    . conjugated estrogens (PREMARIN) vaginal cream Place 1 Applicatorful vaginally 2 (two) times a week.    . incobotulinumtoxinA (XEOMIN) 50 units SOLR injection Inject 50 Units into the muscle every 3 (three) months.    . latanoprost (XALATAN) 0.005 % ophthalmic solution Place 1 drop into both eyes at bedtime.    Marland Kitchen losartan (COZAAR) 100 MG tablet Take 100 mg by mouth every morning.   0  . trihexyphenidyl (ARTANE) 2 MG tablet Take 1.5 tablets (3 mg total) by mouth 3 (three) times daily. 150 tablet 4   Facility-Administered Medications Prior to Visit  Medication Dose Route Frequency Provider Last Rate Last Dose  . incobotulinumtoxinA (XEOMIN) 50 units injection 50 Units  50 Units Intramuscular Q90 days Marcial Pacas, MD   50 Units at 03/24/16 1505    PAST MEDICAL HISTORY: Past Medical History:  Diagnosis Date  . Glaucoma   . History of cervical dysplasia    CIN III  w Severe dysplasia  s/p  cervical cone bx 09/ 2014  . History of colon polyps    hyperplastic 08/ 2011  . Hyperlipidemia    diet controlled - no meds  . Hypertension   . Neuromuscular disorder ([place])   . Tics of organic origin  Dr. Doy Pratt  . VAIN II (vaginal intraepithelial neoplasia grade II)   . Wears glasses     PAST SURGICAL HISTORY: Past Surgical History:  Procedure Laterality Date  . ABDOMINAL HYSTERECTOMY N/A 07/22/2014   Procedure: HYSTERECTOMY ABDOMINAL;  Surgeon: Laurie Mass, MD;  Location: [facility];  Service: Gynecology;  Laterality: N/A;  . CATARACT EXTRACTION W/ INTRAOCULAR LENS IMPLANT Left 2014  . CERVICAL CONIZATION W/BX N/A 03/12/2013   Procedure: COLD KNIFE CONIZATION CERVIX WITH BIOPSY/EXAM UNDER ANESTHESIA, RECTAL EXAM;   Surgeon: Laurie Chapel, MD;  Location: [facility];  Service: Gynecology;  Laterality: N/A;  . CERVICAL CONIZATION W/BX N/A 03/12/2013   Procedure: CONIZATION CERVIX WITH BIOPSY;  Surgeon: Laurie Chapel, MD;  Location: [facility];  Service: Gynecology;  Laterality: N/A;  patient returned to OR for fulgeration to stop bleeding  . CO2 LASER APPLICATION N/A 10/01/5571   Procedure: CO2 LASER OF THE VAGINA;  Surgeon: Laurie Amber, MD;  Location: [facility];  Service: Gynecology;  Laterality: N/A;  . COLONOSCOPY  02-23-2010  . DILATION AND CURETTAGE OF UTERUS    . SALPINGOOPHORECTOMY Bilateral 07/22/2014   Procedure: BILATERAL SALPINGO OOPHORECTOMY;  Surgeon: Laurie Mass, MD;  Location: [facility];  Service: Gynecology;  Laterality: Bilateral;  . TUBAL LIGATION      FAMILY HISTORY: Family History  Problem Relation Age of Onset  . Hypertension Mother   . Diabetes Father   . Hypertension Father   . Hypertension Other   . Heart disease Maternal Aunt     CHF    SOCIAL HISTORY: Social History   Social History  . Marital status: Widowed    Spouse name: N/A  . Number of children: 2  . Years of education: 12   Occupational History  . retired     Social History Main Topics  . Smoking status: Never Smoker  . Smokeless tobacco: Never Used  . Alcohol use No  . Drug use: No  . Sexual activity: Not on file   Other Topics Concern  . Not on file   Social History Narrative   Patient is widowed with 2 children   Patient is right handed   Patient has a high school education   Patient drinks 1 cup daily     PHYSICAL EXAM  Vitals:   06/23/16 1139  BP: (!) 156/70  Pulse: 65  Weight: 177 lb 8 oz (80.5 kg)  Height: 5\' 7"  (1.702 m)   Body Pratt index is 27.8 kg/m. General: well developed, well nourished, seated, in no evident distress Head: head normocephalic and atraumatic. Oropharynx benign. Neck: supple with no  carotid bruits. Neurologic Exam  Mental Status: Awake and fully alert. Oriented to place and time. Follows all commands. Mood and affect appropriate. Cranial Nerves: Pupils equal, briskly reactive to light. Extraocular movements full without nystagmus. Visual fields full to confrontation. Hearing intact and symmetric to finger snap. Facial sensation intact. Face, tongue, palate move normally and symmetrically. Neck flexion and extension normal. Infrequent pursing and smiling movements of the lips. Rare blepharospasm noted.  Motor: Normal bulk and tone. Normal strength in all tested extremity muscles. Sensory: intact to light touch. Coordination: Rapid alternating movements normal in all extremities. Finger-to-nose and heel-to-shin performed accurately bilaterally. Gait and Station: Arises from chair without difficulty. Stance is normal. Tandem gait is normal Reflexes: 1+ and symmetric. Toes downgoing.   DIAGNOSTIC DATA (LABS, IMAGING, TESTING) - I reviewed patient records, labs, notes, testing and imaging  myself where available.  Lab Results  Component Value Date   WBC 6.7 07/08/2015   HGB 12.9 12/31/2015   HCT 38.0 12/31/2015   MCV 87.6 07/08/2015   PLT 332.0 07/08/2015      Component Value Date/Time   NA 140 12/31/2015 1113   K 3.3 (L) 12/31/2015 1113   CL 104 07/08/2015 1551   CO2 28 07/08/2015 1551   GLUCOSE 88 12/31/2015 1113   BUN 22 07/08/2015 1551   CREATININE 1.10 07/08/2015 1551   CALCIUM 10.3 07/08/2015 1551   PROT 7.9 07/08/2015 1551   ALBUMIN 4.0 07/08/2015 1551   AST 19 07/08/2015 1551   ALT 16 07/08/2015 1551   ALKPHOS 85 07/08/2015 1551   BILITOT 0.4 07/08/2015 1551   GFRNONAA 45 (L) 07/23/2014 0525   GFRAA 52 (L) 07/23/2014 0525    ASSESSMENT AND PLAN  75 y.o. year old female  Blepharospasm  EMG guided xeomin injection, 50 units total   Used 35 units, discarded 15 units  Right orbicularis ocular at 4, 6 8, 9,= 10 units total Left  orbicularis oculi at 2, 3, 6, 8, 2.5 units in each= 10 units total  Right corrugate 5 units Left corrugate 5 units Procerus 5 units   I refilled her Artane 2 mg 3 times a day, and Zoloft 50mg  daily   Marcial Pacas, M.D. Ph.D.  [facility] [place], [place] [address] Phone: [phone] Fax:      [phone]

## 2016-06-23 NOTE — Addendum Note (Signed)
Addended by: Marcial Pacas on: 06/23/2016 01:08 PM   Modules accepted: Orders

## 2016-07-18 ENCOUNTER — Other Ambulatory Visit: Payer: Self-pay | Admitting: Internal Medicine

## 2016-07-19 NOTE — Telephone Encounter (Signed)
I called patient to schedule injection and she stated that she did not want to schedule them any more due to the fact that they cause her face to swell.

## 2016-07-19 NOTE — Telephone Encounter (Signed)
Spoke to patient - she does not wish to have any further injections of Xeomin.  She would like to continue her Artane and will schedule a regular follow up appointment.

## 2016-07-20 ENCOUNTER — Telehealth: Payer: Self-pay | Admitting: Internal Medicine

## 2016-07-20 MED ORDER — ATENOLOL-CHLORTHALIDONE 100-25 MG PO TABS: 1.0000 | ORAL_TABLET | Freq: Every morning | ORAL | 0 refills | Status: DC

## 2016-07-20 NOTE — Telephone Encounter (Signed)
Pt was wondering if we can send in some of atenolol-chlorthalidone (TENORETIC) 100-25 MG tablet until she comes in on 07/29/2016. She is out of this med.

## 2016-07-20 NOTE — Telephone Encounter (Signed)
Sent in 30 day only to [facility] until appt...Johny Chess

## 2016-07-29 ENCOUNTER — Encounter: Payer: Medicare Other | Admitting: Internal Medicine

## 2016-08-03 ENCOUNTER — Other Ambulatory Visit (INDEPENDENT_AMBULATORY_CARE_PROVIDER_SITE_OTHER): Payer: Medicare Other

## 2016-08-03 ENCOUNTER — Ambulatory Visit (INDEPENDENT_AMBULATORY_CARE_PROVIDER_SITE_OTHER): Payer: Medicare Other | Admitting: Internal Medicine

## 2016-08-03 ENCOUNTER — Encounter: Payer: Medicare Other | Admitting: Internal Medicine

## 2016-08-03 ENCOUNTER — Encounter: Payer: Self-pay | Admitting: Internal Medicine

## 2016-08-03 VITALS — BP 130/70 | HR 60 | Ht 67.0 in | Wt 178.0 lb

## 2016-08-03 DIAGNOSIS — E785 Hyperlipidemia, unspecified: Secondary | ICD-10-CM | POA: Diagnosis not present

## 2016-08-03 DIAGNOSIS — I10 Essential (primary) hypertension: Secondary | ICD-10-CM

## 2016-08-03 DIAGNOSIS — R609 Edema, unspecified: Secondary | ICD-10-CM

## 2016-08-03 DIAGNOSIS — F959 Tic disorder, unspecified: Secondary | ICD-10-CM | POA: Diagnosis not present

## 2016-08-03 DIAGNOSIS — Z0000 Encounter for general adult medical examination without abnormal findings: Secondary | ICD-10-CM

## 2016-08-03 DIAGNOSIS — G245 Blepharospasm: Secondary | ICD-10-CM | POA: Diagnosis not present

## 2016-08-03 LAB — CBC WITH DIFFERENTIAL/PLATELET
BASOS PCT: 0.6 % (ref 0.0–3.0)
Basophils Absolute: 0 10*3/uL (ref 0.0–0.1)
EOS PCT: 2.5 % (ref 0.0–5.0)
Eosinophils Absolute: 0.1 10*3/uL (ref 0.0–0.7)
HEMATOCRIT: 40.5 % (ref 36.0–46.0)
Hemoglobin: 13.6 g/dL (ref 12.0–15.0)
LYMPHS ABS: 2.4 10*3/uL (ref 0.7–4.0)
LYMPHS PCT: 40.4 % (ref 12.0–46.0)
MCHC: 33.5 g/dL (ref 30.0–36.0)
MCV: 87.2 fl (ref 78.0–100.0)
MONOS PCT: 12 % (ref 3.0–12.0)
Monocytes Absolute: 0.7 10*3/uL (ref 0.1–1.0)
NEUTROS ABS: 2.6 10*3/uL (ref 1.4–7.7)
Neutrophils Relative %: 44.5 % (ref 43.0–77.0)
PLATELETS: 302 10*3/uL (ref 150.0–400.0)
RBC: 4.65 Mil/uL (ref 3.87–5.11)
RDW: 13.6 % (ref 11.5–15.5)
WBC: 5.9 10*3/uL (ref 4.0–10.5)

## 2016-08-03 LAB — URINALYSIS, ROUTINE W REFLEX MICROSCOPIC
BILIRUBIN URINE: NEGATIVE
Hgb urine dipstick: NEGATIVE
Ketones, ur: NEGATIVE
NITRITE: POSITIVE — AB
PH: 7.5 (ref 5.0–8.0)
Specific Gravity, Urine: 1.015 (ref 1.000–1.030)
TOTAL PROTEIN, URINE-UPE24: NEGATIVE
URINE GLUCOSE: NEGATIVE
UROBILINOGEN UA: 0.2 (ref 0.0–1.0)

## 2016-08-03 LAB — BASIC METABOLIC PANEL
BUN: 17 mg/dL (ref 6–23)
CHLORIDE: 103 meq/L (ref 96–112)
CO2: 31 meq/L (ref 19–32)
Calcium: 9.8 mg/dL (ref 8.4–10.5)
Creatinine, Ser: 1.31 mg/dL — ABNORMAL HIGH (ref 0.40–1.20)
GFR: 51.17 mL/min — ABNORMAL LOW (ref >60.00)
GLUCOSE: 102 mg/dL — AB (ref 70–99)
Potassium: 4.3 mEq/L (ref 3.5–5.1)
SODIUM: 140 meq/L (ref 135–145)

## 2016-08-03 LAB — HEPATIC FUNCTION PANEL
ALBUMIN: 4 g/dL (ref 3.5–5.2)
ALK PHOS: 79 U/L (ref 39–117)
ALT: 11 U/L (ref 0–35)
AST: 16 U/L (ref 0–37)
Bilirubin, Direct: 0.1 mg/dL (ref 0.0–0.3)
Total Bilirubin: 0.5 mg/dL (ref 0.2–1.2)
Total Protein: 7.6 g/dL (ref 6.0–8.3)

## 2016-08-03 LAB — LIPID PANEL
CHOLESTEROL: 222 mg/dL — AB (ref 0–200)
HDL: 60.1 mg/dL (ref >39.00)
LDL Cholesterol: 137 mg/dL — ABNORMAL HIGH (ref 0–99)
NONHDL: 162.25
Total CHOL/HDL Ratio: 4
Triglycerides: 124 mg/dL (ref 0.0–149.0)
VLDL: 24.8 mg/dL (ref 0.0–40.0)

## 2016-08-03 LAB — TSH: TSH: 3.01 u[IU]/mL (ref 0.35–4.50)

## 2016-08-03 MED ORDER — LOSARTAN POTASSIUM 100 MG PO TABS: 100.0000 mg | ORAL_TABLET | Freq: Every morning | ORAL | 3 refills | Status: DC

## 2016-08-03 MED ORDER — FUROSEMIDE 20 MG PO TABS: 10.0000 mg | ORAL_TABLET | Freq: Every day | ORAL | 1 refills | Status: DC | PRN

## 2016-08-03 MED ORDER — ATENOLOL-CHLORTHALIDONE 100-25 MG PO TABS: 1.0000 | ORAL_TABLET | Freq: Every morning | ORAL | 3 refills | Status: DC

## 2016-08-03 NOTE — Progress Notes (Signed)
Subjective:  Patient ID: Laurie Pratt, female    DOB: Dec 20, 1940  Age: 76 y.o. MRN: [id number]  CC: No chief complaint on file.   HPI Laurie Pratt presents for a well exam. C/o swelling at times  Outpatient Medications Prior to Visit  Medication Sig Dispense Refill  . atenolol-chlorthalidone (TENORETIC) 100-25 MG tablet Take 1 tablet by mouth every morning. Must keep Jan 19th appt for future refills 30 tablet 0  . Cholecalciferol (VITAMIN D3) 1000 units CAPS Take 1 capsule by mouth every morning.    . conjugated estrogens (PREMARIN) vaginal cream Place 1 Applicatorful vaginally 2 (two) times a week.    . incobotulinumtoxinA (XEOMIN) 50 units SOLR injection Inject 50 Units into the muscle every 3 (three) months.    . latanoprost (XALATAN) 0.005 % ophthalmic solution Place 1 drop into both eyes at bedtime.    Marland Kitchen losartan (COZAAR) 100 MG tablet Take 100 mg by mouth every morning.   0  . sertraline (ZOLOFT) 50 MG tablet Take 1 tablet (50 mg total) by mouth daily. 30 tablet 11  . trihexyphenidyl (ARTANE) 2 MG tablet Take 1.5 tablets (3 mg total) by mouth 3 (three) times daily. 90 tablet 6   Facility-Administered Medications Prior to Visit  Medication Dose Route Frequency Provider Last Rate Last Dose  . incobotulinumtoxinA (XEOMIN) 50 units injection 50 Units  50 Units Intramuscular Q90 days Marcial Pacas, MD   50 Units at 03/24/16 1505  . incobotulinumtoxinA (XEOMIN) 50 units injection 50 Units  50 Units Intramuscular Q90 days Marcial Pacas, MD   50 Units at 06/23/16 1307    ROS Review of Systems  Constitutional: Negative for activity change, appetite change, chills, fatigue and unexpected weight change.  HENT: Negative for congestion, mouth sores and sinus pressure.   Eyes: Negative for visual disturbance.  Respiratory: Negative for cough and chest tightness.   Gastrointestinal: Negative for abdominal pain and nausea.  Genitourinary: Negative for difficulty urinating, frequency and  vaginal pain.  Musculoskeletal: Negative for back pain and gait problem.  Skin: Negative for color change, pallor and rash.  Neurological: Positive for tremors. Negative for dizziness, weakness, numbness and headaches.  Psychiatric/Behavioral: Negative for confusion, self-injury, sleep disturbance and suicidal ideas. The patient is not nervous/anxious.     Objective:  BP 130/70   Pulse 60   Ht 5\' 7"  (1.702 m)   Wt 178 lb (80.7 kg)   SpO2 96%   BMI 27.88 kg/m   BP Readings from Last 3 Encounters:  08/03/16 130/70  06/23/16 (!) 156/70  03/24/16 129/75    Wt Readings from Last 3 Encounters:  08/03/16 178 lb (80.7 kg)  06/23/16 177 lb 8 oz (80.5 kg)  03/24/16 174 lb 8 oz (79.2 kg)    Physical Exam  Constitutional: She appears well-developed. No distress.  HENT:  Head: Normocephalic.  Right Ear: External ear normal.  Left Ear: External ear normal.  Nose: Nose normal.  Mouth/Throat: Oropharynx is clear and moist.  Eyes: Conjunctivae are normal. Pupils are equal, round, and reactive to light. Right eye exhibits no discharge. Left eye exhibits no discharge.  Neck: Normal range of motion. Neck supple. No JVD present. No tracheal deviation present. No thyromegaly present.  Cardiovascular: Normal rate, regular rhythm and normal heart sounds.   Pulmonary/Chest: No stridor. No respiratory distress. She has no wheezes.  Abdominal: Soft. Bowel sounds are normal. She exhibits no distension and no mass. There is no tenderness. There is no rebound and no guarding.  Musculoskeletal: She exhibits no edema or tenderness.  Lymphadenopathy:    She has no cervical adenopathy.  Neurological: She displays normal reflexes. No cranial nerve deficit. She exhibits normal muscle tone. Coordination normal.  Skin: No rash noted. No erythema.  Psychiatric: She has a normal mood and affect. Her behavior is normal. Judgment and thought content normal.  tics  Lab Results  Component Value Date   WBC 6.7  07/08/2015   HGB 12.9 12/31/2015   HCT 38.0 12/31/2015   PLT 332.0 07/08/2015   GLUCOSE 88 12/31/2015   CHOL 231 (H) 07/08/2015   TRIG 246.0 (H) 07/08/2015   HDL 53.40 07/08/2015   LDLDIRECT 134.0 07/08/2015   ALT 16 07/08/2015   AST 19 07/08/2015   NA 140 12/31/2015   K 3.3 (L) 12/31/2015   CL 104 07/08/2015   CREATININE 1.10 07/08/2015   BUN 22 07/08/2015   CO2 28 07/08/2015   TSH 1.83 07/08/2015   INR 1.0 06/09/2014    No results found.  Assessment & Plan:   There are no diagnoses linked to this encounter. I am having Ms. Shean maintain her losartan, conjugated estrogens, latanoprost, Vitamin D3, incobotulinumtoxinA, trihexyphenidyl, sertraline, and atenolol-chlorthalidone. We will continue to administer incobotulinumtoxinA and incobotulinumtoxinA.  No orders of the defined types were placed in this encounter.    Follow-up: No Follow-up on file.  Walker Kehr, MD

## 2016-08-03 NOTE — Assessment & Plan Note (Signed)
Here for medicare wellness/physical  Diet: heart healthy  Physical activity: not sedentary  Depression/mood screen: negative  Hearing: intact to whispered voice  Visual acuity: grossly normal, performs annual eye exam  ADLs: capable  Fall risk: low to none  Home safety: good  Cognitive evaluation: intact to orientation, naming, recall and repetition  EOL planning: adv directives, full code/ I agree  I have personally reviewed and have noted  1. The patient's medical, surgical and social history  2. Their use of alcohol, tobacco or illicit drugs  3. Their current medications and supplements  4. The patient's functional ability including ADL's, fall risks, home safety risks and hearing or visual impairment.  5. Diet and physical activities  6. Evidence for depression or mood disorders 7. The roster of all physicians providing medical care to patient - is listed in the Snapshot section of the chart and reviewed today.    Today patient counseled on age appropriate routine health concerns for screening and prevention, each reviewed and up to date or declined. Immunizations reviewed and up to date or declined. Labs ordered and reviewed. Risk factors for depression reviewed and negative. Hearing function and visual acuity are intact. ADLs screened and addressed as needed. Functional ability and level of safety reviewed and appropriate. Education, counseling and referrals performed based on assessed risks today. Patient provided with a copy of personalized plan for preventive services.  Colon due in 2020

## 2016-08-03 NOTE — Assessment & Plan Note (Signed)
F/u w/Dr Yan 

## 2016-08-03 NOTE — Patient Instructions (Signed)

## 2016-08-03 NOTE — Assessment & Plan Note (Signed)
Losartan and Tenoretic

## 2016-08-03 NOTE — Progress Notes (Signed)
Pre visit review using our clinic review tool, if applicable. No additional management support is needed unless otherwise documented below in the visit note. 

## 2016-08-03 NOTE — Assessment & Plan Note (Signed)
intermittent and mild Lasix prn

## 2016-08-05 ENCOUNTER — Other Ambulatory Visit: Payer: Self-pay | Admitting: Internal Medicine

## 2016-08-05 MED ORDER — CEFUROXIME AXETIL 250 MG PO TABS: 250.0000 mg | ORAL_TABLET | Freq: Two times a day (BID) | ORAL | 0 refills | Status: AC

## 2016-09-22 DIAGNOSIS — H2511 Age-related nuclear cataract, right eye: Secondary | ICD-10-CM | POA: Diagnosis not present

## 2016-09-22 DIAGNOSIS — Z961 Presence of intraocular lens: Secondary | ICD-10-CM | POA: Diagnosis not present

## 2016-09-22 DIAGNOSIS — H401111 Primary open-angle glaucoma, right eye, mild stage: Secondary | ICD-10-CM | POA: Diagnosis not present

## 2016-09-22 DIAGNOSIS — H401123 Primary open-angle glaucoma, left eye, severe stage: Secondary | ICD-10-CM | POA: Diagnosis not present

## 2016-11-03 ENCOUNTER — Ambulatory Visit (INDEPENDENT_AMBULATORY_CARE_PROVIDER_SITE_OTHER): Payer: Medicare Other | Admitting: Gynecology

## 2016-11-03 ENCOUNTER — Encounter: Payer: Self-pay | Admitting: Gynecology

## 2016-11-03 VITALS — BP 124/80 | Ht 69.0 in | Wt 179.0 lb

## 2016-11-03 DIAGNOSIS — Z01419 Encounter for gynecological examination (general) (routine) without abnormal findings: Secondary | ICD-10-CM

## 2016-11-03 DIAGNOSIS — M8589 Other specified disorders of bone density and structure, multiple sites: Secondary | ICD-10-CM

## 2016-11-03 DIAGNOSIS — M8580 Other specified disorders of bone density and structure, unspecified site: Secondary | ICD-10-CM

## 2016-11-03 DIAGNOSIS — N952 Postmenopausal atrophic vaginitis: Secondary | ICD-10-CM

## 2016-11-03 DIAGNOSIS — L292 Pruritus vulvae: Secondary | ICD-10-CM | POA: Diagnosis not present

## 2016-11-03 DIAGNOSIS — Z79890 Hormone replacement therapy (postmenopausal): Secondary | ICD-10-CM | POA: Diagnosis not present

## 2016-11-03 DIAGNOSIS — Z9189 Other specified personal risk factors, not elsewhere classified: Secondary | ICD-10-CM | POA: Diagnosis not present

## 2016-11-03 LAB — WET PREP FOR TRICH, YEAST, CLUE
Clue Cells Wet Prep HPF POC: NONE SEEN
Trich, Wet Prep: NONE SEEN
Yeast Wet Prep HPF POC: NONE SEEN

## 2016-11-03 MED ORDER — ESTRADIOL 0.1 MG/GM VA CREA: 2.0000 g | TOPICAL_CREAM | VAGINAL | 8 refills | Status: DC

## 2016-11-03 NOTE — Patient Instructions (Signed)
Conjugated Estrogens vaginal cream What is this medicine? CONJUGATED ESTROGENS (CON ju gate ed ESS troe jenz) are a mixture of female hormones. This cream can help relieve symptoms associated with menopause.like vaginal dryness and irritation. This medicine may be used for other purposes; ask your health care provider or pharmacist if you have questions. COMMON BRAND NAME(S): Premarin What should I tell my health care provider before I take this medicine? They need to know if you have any of these conditions: -abnormal vaginal bleeding -blood vessel disease or blood clots -breast, cervical, endometrial, or uterine cancer -dementia -diabetes -gallbladder disease -heart disease or recent heart attack -high blood pressure -high cholesterol -high level of calcium in the blood -hysterectomy -kidney disease -liver disease -migraine headaches -protein C deficiency -protein S deficiency -stroke -systemic lupus erythematosus (SLE) -tobacco smoker -an unusual or allergic reaction to estrogens other medicines, foods, dyes, or preservatives -pregnant or trying to get pregnant -breast-feeding How should I use this medicine? This medicine is for use in the vagina only. Do not take by mouth. Follow the directions on the prescription label. Use at bedtime unless otherwise directed by your doctor or health care professional. Use the special applicator supplied with the cream. Wash hands before and after use. Fill the applicator with the cream and remove from the tube. Lie on your back, part and bend your knees. Insert the applicator into the vagina and push the plunger to expel the cream into the vagina. Wash the applicator with warm soapy water and rinse well. Use exactly as directed for the complete length of time prescribed. Do not stop using except on the advice of your doctor or health care professional. Talk to your pediatrician regarding the use of this medicine in children. Special care may be  needed. A patient package insert for the product will be given with each prescription and refill. Read this sheet carefully each time. The sheet may change frequently. Overdosage: If you think you have taken too much of this medicine contact a poison control center or emergency room at once. NOTE: This medicine is only for you. Do not share this medicine with others. What if I miss a dose? If you miss a dose, use it as soon as you can. If it is almost time for your next dose, use only that dose. Do not use double or extra doses. What may interact with this medicine? Do not take this medicine with any of the following medications: -aromatase inhibitors like aminoglutethimide, anastrozole, exemestane, letrozole, testolactone This medicine may also interact with the following medications: -barbiturates used for inducing sleep or treating seizures -carbamazepine -grapefruit juice -medicines for fungal infections like itraconazole and ketoconazole -raloxifene or tamoxifen -rifabutin -rifampin -rifapentine -ritonavir -some antibiotics used to treat infections -St. John's Wort -warfarin This list may not describe all possible interactions. Give your health care provider a list of all the medicines, herbs, non-prescription drugs, or dietary supplements you use. Also tell them if you smoke, drink alcohol, or use illegal drugs. Some items may interact with your medicine. What should I watch for while using this medicine? Visit your health care professional for regular checks on your progress. You will need a regular breast and pelvic exam. You should also discuss the need for regular mammograms with your health care professional, and follow his or her guidelines. This medicine can make your body retain fluid, making your fingers, hands, or ankles swell. Your blood pressure can go up. Contact your doctor or health care professional if you  feel you are retaining fluid. If you have any reason to think  you are pregnant; stop taking this medicine at once and contact your doctor or health care professional. Tobacco smoking increases the risk of getting a blood clot or having a stroke, especially if you are more than 74 years old. You are strongly advised not to smoke. If you wear contact lenses and notice visual changes, or if the lenses begin to feel uncomfortable, consult your eye care specialist. If you are going to have elective surgery, you may need to stop taking this medicine beforehand. Consult your health care professional for advice prior to scheduling the surgery. What side effects may I notice from receiving this medicine? Side effects that you should report to your doctor or health care professional as soon as possible: -allergic reactions like skin rash, itching or hives, swelling of the face, lips, or tongue -breast tissue changes or discharge -changes in vision -chest pain -confusion, trouble speaking or understanding -dark urine -general ill feeling or flu-like symptoms -light-colored stools -nausea, vomiting -pain, swelling, warmth in the leg -right upper belly pain -severe headaches -shortness of breath -sudden numbness or weakness of the face, arm or leg -trouble walking, dizziness, loss of balance or coordination -unusual vaginal bleeding -yellowing of the eyes or skin Side effects that usually do not require medical attention (report to your doctor or health care professional if they continue or are bothersome): -hair loss -increased hunger or thirst -increased urination -symptoms of vaginal infection like itching, irritation or unusual discharge -unusually weak or tired This list may not describe all possible side effects. Call your doctor for medical advice about side effects. You may report side effects to FDA at 1-800-FDA-1088. Where should I keep my medicine? Keep out of the reach of children. Store at room temperature between 15 and 30 degrees C (59 and 86  degrees F). Throw away any unused medicine after the expiration date. NOTE: This sheet is a summary. It may not cover all possible information. If you have questions about this medicine, talk to your doctor, pharmacist, or health care provider.  2018 Elsevier/Gold Standard (2010-09-29 09:20:36)

## 2016-11-03 NOTE — Progress Notes (Signed)
PAMLEA FINDER 10-01-1940 [id number]   History:    77 y.o.  for annual gyn exam  with a complaint of vaginal dryness and irritation. Patient not sexually active. Review of patient's record indicated that in 2016 she underwent a total abdominal hysterectomy with bilateral salpingo-oophorectomy and lysis of pelvic adhesions as a result of cervical intraepithelial neoplasia III. her final pathology report demonstrated the following: Diagnosis 1. Cervix, resection w/o uterus HIGH GRADE SQUAMOUS INTRAEPITHELIAL LESION, CIN-II (MODERATE DYSPLASIA) AND ADJACENT LOW GRADE SQUAMOUS INTRAEPITHELIAL LESION, CIN-I (MILD DYSPLASIA). MARGINS NEGATIVE FOR DYSPLASIA OR MALIGNANCY. 2. Uterus, ovaries and fallopian tubes ENDOMETRIUM: BENIGN PROLIFERATIVE ENDOMETRIUM, NO ATYPIA, HYPERPLASIA OR MALIGNANCY. MYOMETRIUM: LEIOMYOMA AND ADENOMYOSIS. UTERINE SEROSA: UNREMARKABLE. BILATERAL OVARIES AND FALLOPIAN TUBES: NO PATHOLOGIC ABNORMALITIES. Microscopic Comment 1. The dysplastic squamous epithelium is positive for p16 and shows increase of Ki-67 stain.  Her PCP is Dr. Alain Marion who has been doing her blood work. Patient with past history of benign colon polyps her last colonoscopy was in 2011. Bone density study in 2017 demonstrated the lowest T score at the left femoral neck with a value of -1.8 and normal Frax analysis  Past medical history,surgical history, family history and social history were all reviewed and documented in the EPIC chart.  Gynecologic History No LMP recorded. Patient is postmenopausal. Contraception: status post hysterectomy Last Pap:  2017. Results were: normal Last mammogram:  2016. Results were: normal  Obstetric History OB History  Gravida Para Term Preterm AB Living  '4 3     1 3  ' SAB TAB Ectopic Multiple Live Births  1            # Outcome Date GA Lbr Len/2nd Weight Sex Delivery Anes PTL Lv  4 SAB           3 Para           2 Para           1 Para                 ROS: A ROS was performed and pertinent positives and negatives are included in the history.  GENERAL: No fevers or chills. HEENT: No change in vision, no earache, sore throat or sinus congestion. NECK: No pain or stiffness. CARDIOVASCULAR: No chest pain or pressure. No palpitations. PULMONARY: No shortness of breath, cough or wheeze. GASTROINTESTINAL: No abdominal pain, nausea, vomiting or diarrhea, melena or bright red blood per rectum. GENITOURINARY: No urinary frequency, urgency, hesitancy or dysuria. MUSCULOSKELETAL: No joint or muscle pain, no back pain, no recent trauma. DERMATOLOGIC: No rash, no itching, no lesions. ENDOCRINE: No polyuria, polydipsia, no heat or cold intolerance. No recent change in weight. HEMATOLOGICAL: No anemia or easy bruising or bleeding. NEUROLOGIC: No headache, seizures, numbness, tingling or weakness. PSYCHIATRIC: No depression, no loss of interest in normal activity or change in sleep pattern.     Exam: chaperone present  BP 124/80   Ht '5\' 9"'  (1.753 m)   Wt 179 lb (81.2 kg)   BMI 26.43 kg/m   Body mass index is 26.43 kg/m.  General appearance : Well developed well nourished female. No acute distress HEENT: Eyes: no retinal hemorrhage or exudates,  Neck supple, trachea midline, no carotid bruits, no thyroidmegaly Lungs: Clear to auscultation, no rhonchi or wheezes, or rib retractions  Heart: Regular rate and rhythm, no murmurs or gallops Breast:Examined in sitting and supine position were symmetrical in appearance, no palpable masses or tenderness,  no skin retraction, no  nipple inversion, no nipple discharge, no skin discoloration, no axillary or supraclavicular lymphadenopathy Abdomen: no palpable masses or tenderness, no rebound or guarding Extremities: no edema or skin discoloration or tenderness  Pelvic:  Bartholin, Urethra, Skene Glands: Within normal limits             Vagina: No gross lesions or discharge, atrophic changes   Cervix:  absent  Uterus  absent Adnexa  Without masses or tenderness  Anus and perineum  normal   Rectovaginal  normal sphincter tone without palpated masses or tenderness             Hemoccult  PCP provides   Wet prep negative  Assessment/Plan:  76 y.o. female for annual exam  with atrophic vaginitis will be prescribed Estrace vaginal cream to apply 2 times a week at bedtime. Risk benefits and pros and cons were discussed. Pap smear not done this year. PCP has been doing her blood work. Patient overdue for mammogram.   Terrance Mass MD, 10:54 AM 11/03/2016

## 2016-11-23 ENCOUNTER — Encounter: Payer: Self-pay | Admitting: Gynecology

## 2017-03-30 DIAGNOSIS — H401111 Primary open-angle glaucoma, right eye, mild stage: Secondary | ICD-10-CM | POA: Diagnosis not present

## 2017-03-30 DIAGNOSIS — H401123 Primary open-angle glaucoma, left eye, severe stage: Secondary | ICD-10-CM | POA: Diagnosis not present

## 2017-03-30 DIAGNOSIS — Z961 Presence of intraocular lens: Secondary | ICD-10-CM | POA: Diagnosis not present

## 2017-03-30 DIAGNOSIS — H2511 Age-related nuclear cataract, right eye: Secondary | ICD-10-CM | POA: Diagnosis not present

## 2017-06-26 ENCOUNTER — Ambulatory Visit (INDEPENDENT_AMBULATORY_CARE_PROVIDER_SITE_OTHER): Payer: Medicare Other | Admitting: Family

## 2017-06-26 ENCOUNTER — Encounter: Payer: Self-pay | Admitting: Family

## 2017-06-26 ENCOUNTER — Other Ambulatory Visit: Payer: Self-pay | Admitting: *Deleted

## 2017-06-26 ENCOUNTER — Other Ambulatory Visit: Payer: Medicare Other

## 2017-06-26 VITALS — BP 140/82 | HR 61 | Temp 98.1°F | Ht 69.0 in | Wt 176.0 lb

## 2017-06-26 DIAGNOSIS — N3000 Acute cystitis without hematuria: Secondary | ICD-10-CM

## 2017-06-26 DIAGNOSIS — R350 Frequency of micturition: Secondary | ICD-10-CM

## 2017-06-26 LAB — POC URINALSYSI DIPSTICK (AUTOMATED)
BILIRUBIN UA: NEGATIVE
Glucose, UA: NEGATIVE
KETONES UA: NEGATIVE
Nitrite, UA: NEGATIVE
PH UA: 6 (ref 5.0–8.0)
SPEC GRAV UA: 1.02 (ref 1.010–1.025)
Urobilinogen, UA: 0.2 E.U./dL

## 2017-06-26 MED ORDER — SULFAMETHOXAZOLE-TRIMETHOPRIM 800-160 MG PO TABS: 1.0000 | ORAL_TABLET | Freq: Two times a day (BID) | ORAL | 0 refills | Status: DC

## 2017-06-26 MED ORDER — TRIHEXYPHENIDYL HCL 2 MG PO TABS: 3.0000 mg | ORAL_TABLET | Freq: Three times a day (TID) | ORAL | 1 refills | Status: DC

## 2017-06-26 NOTE — Progress Notes (Signed)
Laurie Pratt is a 76 y.o. female with the following history as recorded in EpicCare:  Patient Active Problem List   Diagnosis Date Noted  . Edema 08/03/2016  . Anxiety 06/23/2016  . Blepharospasm 03/24/2016  . Hemifacial spasm 03/24/2016  . VAIN II (vaginal intraepithelial neoplasia grade II) 12/23/2015  . Well adult exam 07/08/2015  . Preop exam for internal medicine 06/09/2014  . CIN III (cervical intraepithelial neoplasia grade III) with severe dysplasia 05/02/2014  . History of colonic polyps 04/16/2014  . Abnormal involuntary movement 01/30/2014  . Severe cervical dysplasia, histologically confirmed 05/03/2013  . Cervical cancer ([place]) 02/26/2013  . Tic disorder 07/07/2009  . EAR PAIN 04/18/2008  . FATIGUE 01/15/2008  . Dyslipidemia 07/04/2007  . Essential hypertension 07/04/2007    Current Outpatient Medications  Medication Sig Dispense Refill  . atenolol-chlorthalidone (TENORETIC) 100-25 MG tablet Take 1 tablet by mouth every morning. 90 tablet 3  . Cholecalciferol (VITAMIN D3) 1000 units CAPS Take 1 capsule by mouth every morning.    . conjugated estrogens (PREMARIN) vaginal cream Place 1 Applicatorful vaginally 2 (two) times a week.    . latanoprost (XALATAN) 0.005 % ophthalmic solution Place 1 drop into both eyes at bedtime.    Marland Kitchen losartan (COZAAR) 100 MG tablet Take 1 tablet (100 mg total) by mouth every morning. 90 tablet 3  . trihexyphenidyl (ARTANE) 2 MG tablet Take 1.5 tablets (3 mg total) by mouth 3 (three) times daily. 135 tablet 1  . furosemide (LASIX) 20 MG tablet Take 0.5-1 tablets (10-20 mg total) by mouth daily as needed. 30 tablet 1  . sulfamethoxazole-trimethoprim (BACTRIM DS,SEPTRA DS) 800-160 MG tablet Take 1 tablet by mouth 2 (two) times daily. 10 tablet 0   Current Facility-Administered Medications  Medication Dose Route Frequency Provider Last Rate Last Dose  . incobotulinumtoxinA (XEOMIN) 50 units injection 50 Units  50 Units Intramuscular Q90 days  Marcial Pacas, MD   50 Units at 03/24/16 1505  . incobotulinumtoxinA (XEOMIN) 50 units injection 50 Units  50 Units Intramuscular Q90 days Marcial Pacas, MD   50 Units at 06/23/16 1307    Allergies: Ace inhibitors; Doxazosin mesylate; and Verapamil  Past Medical History:  Diagnosis Date  . Glaucoma   . History of cervical dysplasia    CIN III  w Severe dysplasia  s/p  cervical cone bx 09/ 2014  . History of colon polyps    hyperplastic 08/ 2011  . Hyperlipidemia    diet controlled - no meds  . Hypertension   . Neuromuscular disorder ([place])   . Tics of organic origin    Dr. Doy Mince  . VAIN II (vaginal intraepithelial neoplasia grade II)   . Wears glasses     Past Surgical History:  Procedure Laterality Date  . ABDOMINAL HYSTERECTOMY N/A 07/22/2014   Procedure: HYSTERECTOMY ABDOMINAL;  Surgeon: Terrance Mass, MD;  Location: [facility];  Service: Gynecology;  Laterality: N/A;  . CATARACT EXTRACTION W/ INTRAOCULAR LENS IMPLANT Left 2014  . CERVICAL CONIZATION W/BX N/A 03/12/2013   Procedure: COLD KNIFE CONIZATION CERVIX WITH BIOPSY/EXAM UNDER ANESTHESIA, RECTAL EXAM;  Surgeon: Alvino Chapel, MD;  Location: [facility];  Service: Gynecology;  Laterality: N/A;  . CERVICAL CONIZATION W/BX N/A 03/12/2013   Procedure: CONIZATION CERVIX WITH BIOPSY;  Surgeon: Alvino Chapel, MD;  Location: [facility];  Service: Gynecology;  Laterality: N/A;  patient returned to OR for fulgeration to stop bleeding  . CO2 LASER APPLICATION N/A 3/81/0175  Procedure: CO2 LASER OF THE VAGINA;  Surgeon: [name], MD;  Location: [facility];  Service: Gynecology;  Laterality: N/A;  . COLONOSCOPY  02-23-2010  . DILATION AND CURETTAGE OF UTERUS    . SALPINGOOPHORECTOMY Bilateral 07/22/2014   Procedure: BILATERAL SALPINGO OOPHORECTOMY;  Surgeon: Terrance Mass, MD;  Location: [facility];  Service: Gynecology;  Laterality: Bilateral;  . TUBAL LIGATION      Family  History  Problem Relation Age of Onset  . Hypertension Mother   . Diabetes Father   . Hypertension Father   . Heart disease Maternal Aunt        CHF    Social History   Tobacco Use  . Smoking status: Never Smoker  . Smokeless tobacco: Never Used  Substance Use Topics  . Alcohol use: No    Alcohol/week: 0.0 oz    Subjective:  Patient presents with concerns for UTI; started 2 days ago with burning, urgency, frequency; denies any blood in her urine but notes that urine "looked dark." Denies any fever or back pain; does have some discomfort in pelvic area;   Objective:  Vitals:   06/26/17 1405  BP: 140/82  Pulse: 61  Temp: 98.1 F (36.7 C)  TempSrc: Oral  SpO2: 97%  Weight: 176 lb (79.8 kg)  Height: 5\' 9"  (1.753 m)    General: Well developed, well nourished, in no acute distress  Skin : Warm and dry.  Head: Normocephalic and atraumatic  Eyes: Sclera and conjunctiva clear; pupils round and reactive to light; extraocular movements intact  Lungs: Respirations unlabored; clear to auscultation bilaterally without wheeze, rales, rhonchi  CVS exam: normal rate and regular rhythm.  Abdomen: Soft; nontender; nondistended; normoactive bowel sounds; no masses or hepatosplenomegaly  Neurologic: Alert and oriented; speech intact; face symmetrical; moves all extremities well; CNII-XII intact without focal deficit   Assessment:  1. Acute cystitis without hematuria   2. Frequent urination     Plan:  Check U/A and urine culture today; Rx for Bactrim DS bid x 5 days; increase fluids, specifically water, and follow-up worse, no better.  Keep planned appt with her PCP for the end of January 2019.  No Follow-up on file.  Orders Placed This Encounter  Procedures  . Urine Culture    Standing Status:   Future    Standing Expiration Date:   06/26/2018  . POCT Urinalysis Dipstick (Automated)    Requested Prescriptions   Signed Prescriptions Disp Refills  . sulfamethoxazole-trimethoprim  (BACTRIM DS,SEPTRA DS) 800-160 MG tablet 10 tablet 0    Sig: Take 1 tablet by mouth 2 (two) times daily.

## 2017-06-26 NOTE — Patient Instructions (Signed)

## 2017-06-28 LAB — URINE CULTURE
MICRO NUMBER:: 81414968
SPECIMEN QUALITY:: ADEQUATE

## 2017-08-04 ENCOUNTER — Encounter: Payer: Self-pay | Admitting: Internal Medicine

## 2017-08-04 ENCOUNTER — Other Ambulatory Visit (INDEPENDENT_AMBULATORY_CARE_PROVIDER_SITE_OTHER): Payer: Medicare Other

## 2017-08-04 ENCOUNTER — Ambulatory Visit (INDEPENDENT_AMBULATORY_CARE_PROVIDER_SITE_OTHER): Payer: Medicare Other | Admitting: Internal Medicine

## 2017-08-04 DIAGNOSIS — F419 Anxiety disorder, unspecified: Secondary | ICD-10-CM | POA: Diagnosis not present

## 2017-08-04 DIAGNOSIS — E785 Hyperlipidemia, unspecified: Secondary | ICD-10-CM | POA: Diagnosis not present

## 2017-08-04 DIAGNOSIS — G245 Blepharospasm: Secondary | ICD-10-CM | POA: Diagnosis not present

## 2017-08-04 DIAGNOSIS — F959 Tic disorder, unspecified: Secondary | ICD-10-CM

## 2017-08-04 DIAGNOSIS — I10 Essential (primary) hypertension: Secondary | ICD-10-CM

## 2017-08-04 LAB — CBC WITH DIFFERENTIAL/PLATELET
Basophils Absolute: 0.1 10*3/uL (ref 0.0–0.1)
Basophils Relative: 1.2 % (ref 0.0–3.0)
EOS ABS: 0.1 10*3/uL (ref 0.0–0.7)
Eosinophils Relative: 2.3 % (ref 0.0–5.0)
HCT: 40.2 % (ref 36.0–46.0)
HEMOGLOBIN: 13.5 g/dL (ref 12.0–15.0)
Lymphocytes Relative: 32.8 % (ref 12.0–46.0)
Lymphs Abs: 1.7 10*3/uL (ref 0.7–4.0)
MCHC: 33.7 g/dL (ref 30.0–36.0)
MCV: 87.5 fl (ref 78.0–100.0)
MONO ABS: 0.7 10*3/uL (ref 0.1–1.0)
Monocytes Relative: 13.7 % — ABNORMAL HIGH (ref 3.0–12.0)
Neutro Abs: 2.6 10*3/uL (ref 1.4–7.7)
Neutrophils Relative %: 50 % (ref 43.0–77.0)
Platelets: 294 10*3/uL (ref 150.0–400.0)
RBC: 4.59 Mil/uL (ref 3.87–5.11)
RDW: 13.8 % (ref 11.5–15.5)
WBC: 5.1 10*3/uL (ref 4.0–10.5)

## 2017-08-04 LAB — URINALYSIS
BILIRUBIN URINE: NEGATIVE
HGB URINE DIPSTICK: NEGATIVE
Ketones, ur: NEGATIVE
LEUKOCYTES UA: NEGATIVE
NITRITE: NEGATIVE
Specific Gravity, Urine: 1.01 (ref 1.000–1.030)
TOTAL PROTEIN, URINE-UPE24: NEGATIVE
Urine Glucose: NEGATIVE
Urobilinogen, UA: 0.2 (ref 0.0–1.0)
pH: 6.5 (ref 5.0–8.0)

## 2017-08-04 LAB — HEPATIC FUNCTION PANEL
ALK PHOS: 86 U/L (ref 39–117)
ALT: 16 U/L (ref 0–35)
AST: 20 U/L (ref 0–37)
Albumin: 3.8 g/dL (ref 3.5–5.2)
BILIRUBIN DIRECT: 0.1 mg/dL (ref 0.0–0.3)
BILIRUBIN TOTAL: 0.5 mg/dL (ref 0.2–1.2)
Total Protein: 7.6 g/dL (ref 6.0–8.3)

## 2017-08-04 LAB — LIPID PANEL
CHOL/HDL RATIO: 4
CHOLESTEROL: 209 mg/dL — AB (ref 0–200)
HDL: 51.9 mg/dL (ref >39.00)
LDL CALC: 130 mg/dL — AB (ref 0–99)
NONHDL: 156.88
Triglycerides: 136 mg/dL (ref 0.0–149.0)
VLDL: 27.2 mg/dL (ref 0.0–40.0)

## 2017-08-04 LAB — BASIC METABOLIC PANEL
BUN: 15 mg/dL (ref 6–23)
CHLORIDE: 101 meq/L (ref 96–112)
CO2: 29 meq/L (ref 19–32)
CREATININE: 1.18 mg/dL (ref 0.40–1.20)
Calcium: 9.8 mg/dL (ref 8.4–10.5)
GFR: 57.57 mL/min — ABNORMAL LOW (ref >60.00)
GLUCOSE: 97 mg/dL (ref 70–99)
POTASSIUM: 4.1 meq/L (ref 3.5–5.1)
Sodium: 140 mEq/L (ref 135–145)

## 2017-08-04 LAB — TSH: TSH: 4.08 u[IU]/mL (ref 0.35–4.50)

## 2017-08-04 MED ORDER — LOSARTAN POTASSIUM 100 MG PO TABS: 100.0000 mg | ORAL_TABLET | Freq: Every morning | ORAL | 3 refills | Status: DC

## 2017-08-04 MED ORDER — ATENOLOL-CHLORTHALIDONE 100-25 MG PO TABS: 1.0000 | ORAL_TABLET | Freq: Every morning | ORAL | 3 refills | Status: DC

## 2017-08-04 NOTE — Assessment & Plan Note (Signed)
  On diet  

## 2017-08-04 NOTE — Assessment & Plan Note (Signed)
Labs On Losartan, Tenoretic

## 2017-08-04 NOTE — Assessment & Plan Note (Signed)
Chronic Dr Krista Blue On Artane

## 2017-08-04 NOTE — Assessment & Plan Note (Signed)
Doing fair 

## 2017-08-04 NOTE — Patient Instructions (Signed)
[facility] well w/Jill 

## 2017-08-04 NOTE — Progress Notes (Signed)
Subjective:  Patient ID: Laurie Pratt, female    DOB: 23-Sep-1940  Age: 77 y.o. MRN: [id number]  CC: No chief complaint on file.   HPI Laurie Pratt presents for HTN o on Rx, Vit D def, tics f/u Colon due 2021  Outpatient Medications Prior to Visit  Medication Sig Dispense Refill  . atenolol-chlorthalidone (TENORETIC) 100-25 MG tablet Take 1 tablet by mouth every morning. 90 tablet 3  . Cholecalciferol (VITAMIN D3) 1000 units CAPS Take 1 capsule by mouth every morning.    . conjugated estrogens (PREMARIN) vaginal cream Place 1 Applicatorful vaginally 2 (two) times a week.    . furosemide (LASIX) 20 MG tablet Take 0.5-1 tablets (10-20 mg total) by mouth daily as needed. 30 tablet 1  . latanoprost (XALATAN) 0.005 % ophthalmic solution Place 1 drop into both eyes at bedtime.    Marland Kitchen losartan (COZAAR) 100 MG tablet Take 1 tablet (100 mg total) by mouth every morning. 90 tablet 3  . sulfamethoxazole-trimethoprim (BACTRIM DS,SEPTRA DS) 800-160 MG tablet Take 1 tablet by mouth 2 (two) times daily. 10 tablet 0  . trihexyphenidyl (ARTANE) 2 MG tablet Take 1.5 tablets (3 mg total) by mouth 3 (three) times daily. 135 tablet 1   Facility-Administered Medications Prior to Visit  Medication Dose Route Frequency Provider Last Rate Last Dose  . incobotulinumtoxinA (XEOMIN) 50 units injection 50 Units  50 Units Intramuscular Q90 days Marcial Pacas, MD   50 Units at 03/24/16 1505  . incobotulinumtoxinA (XEOMIN) 50 units injection 50 Units  50 Units Intramuscular Q90 days Marcial Pacas, MD   50 Units at 06/23/16 1307    ROS Review of Systems  Constitutional: Negative for activity change, appetite change, chills, fatigue and unexpected weight change.  HENT: Negative for congestion, mouth sores and sinus pressure.   Eyes: Negative for visual disturbance.  Respiratory: Negative for cough and chest tightness.   Gastrointestinal: Negative for abdominal pain and nausea.  Genitourinary: Negative for  difficulty urinating, frequency and vaginal pain.  Musculoskeletal: Negative for back pain and gait problem.  Skin: Negative for pallor and rash.  Neurological: Positive for facial asymmetry. Negative for dizziness, tremors, weakness, numbness and headaches.  Psychiatric/Behavioral: Negative for confusion and sleep disturbance.    Objective:  BP 138/74 (BP Location: Left Arm, Patient Position: Sitting, Cuff Size: Large)   Pulse (!) 58   Temp 98.5 F (36.9 C) (Oral)   Ht 5\' 9"  (1.753 m)   Wt 180 lb (81.6 kg)   SpO2 98%   BMI 26.58 kg/m   BP Readings from Last 3 Encounters:  08/04/17 138/74  06/26/17 140/82  11/03/16 124/80    Wt Readings from Last 3 Encounters:  08/04/17 180 lb (81.6 kg)  06/26/17 176 lb (79.8 kg)  11/03/16 179 lb (81.2 kg)    Physical Exam  Constitutional: She appears well-developed. No distress.  HENT:  Head: Normocephalic.  Right Ear: External ear normal.  Left Ear: External ear normal.  Nose: Nose normal.  Mouth/Throat: Oropharynx is clear and moist.  Eyes: Conjunctivae are normal. Pupils are equal, round, and reactive to light. Right eye exhibits no discharge. Left eye exhibits no discharge.  Neck: Normal range of motion. Neck supple. No JVD present. No tracheal deviation present. No thyromegaly present.  Cardiovascular: Normal rate, regular rhythm and normal heart sounds.  Pulmonary/Chest: No stridor. No respiratory distress. She has no wheezes.  Abdominal: Soft. Bowel sounds are normal. She exhibits no distension and no mass. There is no tenderness.  There is no rebound and no guarding.  Musculoskeletal: She exhibits no edema or tenderness.  Lymphadenopathy:    She has no cervical adenopathy.  Neurological: She displays normal reflexes. No cranial nerve deficit. She exhibits normal muscle tone. Coordination normal.  Skin: No rash noted. No erythema.  Psychiatric: She has a normal mood and affect. Her behavior is normal. Judgment and thought  content normal.  facial tics  Lab Results  Component Value Date   WBC 5.9 08/03/2016   HGB 13.6 08/03/2016   HCT 40.5 08/03/2016   PLT 302.0 08/03/2016   GLUCOSE 102 (H) 08/03/2016   CHOL 222 (H) 08/03/2016   TRIG 124.0 08/03/2016   HDL 60.10 08/03/2016   LDLDIRECT 134.0 07/08/2015   LDLCALC 137 (H) 08/03/2016   ALT 11 08/03/2016   AST 16 08/03/2016   NA 140 08/03/2016   K 4.3 08/03/2016   CL 103 08/03/2016   CREATININE 1.31 (H) 08/03/2016   BUN 17 08/03/2016   CO2 31 08/03/2016   TSH 3.01 08/03/2016   INR 1.0 06/09/2014    No results found.  Assessment & Plan:   There are no diagnoses linked to this encounter. I am having Laurie Pratt. Laurie Pratt maintain her conjugated estrogens, latanoprost, Vitamin D3, atenolol-chlorthalidone, losartan, furosemide, trihexyphenidyl, and sulfamethoxazole-trimethoprim. We will continue to administer incobotulinumtoxinA and incobotulinumtoxinA.  No orders of the defined types were placed in this encounter.    Follow-up: No Follow-up on file.  Walker Kehr, MD

## 2017-08-09 ENCOUNTER — Ambulatory Visit (INDEPENDENT_AMBULATORY_CARE_PROVIDER_SITE_OTHER): Payer: Medicare Other | Admitting: Neurology

## 2017-08-09 ENCOUNTER — Encounter: Payer: Self-pay | Admitting: Neurology

## 2017-08-09 VITALS — BP 138/73 | HR 60 | Ht 69.0 in | Wt 179.5 lb

## 2017-08-09 DIAGNOSIS — G245 Blepharospasm: Secondary | ICD-10-CM

## 2017-08-09 MED ORDER — TRIHEXYPHENIDYL HCL 2 MG PO TABS: 3.0000 mg | ORAL_TABLET | Freq: Three times a day (TID) | ORAL | 4 refills | Status: DC

## 2017-08-09 NOTE — Progress Notes (Signed)
Chief Complaint  Patient presents with  . Tic Disorder    She is here for her yearly follow up with no new concerns.  Feels her symptoms are well managed by Artane.     [facility]  PATIENT: Laurie Pratt   HISTORY OF PRESENT ILLNESS:Ms. Winch, 76 year old female returns for followup. She was last seen 01/30/2014. She has a history of Meige's syndrome presenting primarily with blepharospasm and facial dyskinesias. She has been a patient of [facility] since 2010, previously by Dr. Tyron Russell. She had probable cervical dystonia in the past but that has not been a recent problem. She underwent Botox injections in July 2004 and May 2007 for blepharospasm without significant improvement.  She also takes Artane 2mg  one and 1/2 tab tid, which is beneficial. Her symptoms have been stable since last seen. She denies any side effects to her medication. On exam today she has  frequent pursing and smiling movements of the lips, good voluntary movement in the face tongue and palate. She denies gait difficulties she returns for reevaluation.   UPDATE July 17th 2017:  She continue have frequent forceful eye blinking, mouth purge movement even with Artane 2 mg one and half tablets 3 times a day, she has tried Botox injection many years ago with limited result. After discussed with patient, she is willing to try it again  UPDATE Sept 14th 2017: She came in for EMG guided Xeomin injection, used 50 units, she had almost constant bilateral eyes blinking, mouth perking movement.  UPDATE Dec 14th 2017: She reported limited response to previous injection in Sept 2017, she complains of anxiety,taking Artane 2 mg 3 times a day.  Update August 09, 2017: She complains of allergic reaction was previous xeomin injection, does not want to continue botulism toxin injection anymore, does think Artane 2 mg 1/2 tablet 3 times a day has been helpful, wants the refill of the prescription   REVIEW OF  SYSTEMS: Full 14 system review of systems performed and notable only for those listed, all others are neg:      ALLERGIES: Allergies  Allergen Reactions  . Ace Inhibitors Other (See Comments)    cough  . Doxazosin Mesylate     Unknown reaction  . Verapamil Swelling    REACTION: legs swelling, tired    HOME MEDICATIONS: Outpatient Medications Prior to Visit  Medication Sig Dispense Refill  . atenolol-chlorthalidone (TENORETIC) 100-25 MG tablet Take 1 tablet by mouth every morning. 90 tablet 3  . Cholecalciferol (VITAMIN D3) 1000 units CAPS Take 1 capsule by mouth every morning.    . conjugated estrogens (PREMARIN) vaginal cream Place 1 Applicatorful vaginally 2 (two) times a week.    . furosemide (LASIX) 20 MG tablet Take 0.5-1 tablets (10-20 mg total) by mouth daily as needed. 30 tablet 1  . latanoprost (XALATAN) 0.005 % ophthalmic solution Place 1 drop into both eyes at bedtime.    Marland Kitchen losartan (COZAAR) 100 MG tablet Take 1 tablet (100 mg total) by mouth every morning. 90 tablet 3  . trihexyphenidyl (ARTANE) 2 MG tablet Take 1.5 tablets (3 mg total) by mouth 3 (three) times daily. 135 tablet 1  . sulfamethoxazole-trimethoprim (BACTRIM DS,SEPTRA DS) 800-160 MG tablet Take 1 tablet by mouth 2 (two) times daily. 10 tablet 0  . incobotulinumtoxinA (XEOMIN) 50 units injection 50 Units     . incobotulinumtoxinA (XEOMIN) 50 units injection 50 Units      No facility-administered medications prior to visit.     PAST  MEDICAL HISTORY: Past Medical History:  Diagnosis Date  . Glaucoma   . History of cervical dysplasia    CIN III  w Severe dysplasia  s/p  cervical cone bx 09/ 2014  . History of colon polyps    hyperplastic 08/ 2011  . Hyperlipidemia    diet controlled - no meds  . Hypertension   . Neuromuscular disorder ([place])   . Tics of organic origin    Dr. Doy Mince  . VAIN II (vaginal intraepithelial neoplasia grade II)   . Wears glasses     PAST SURGICAL HISTORY: Past  Surgical History:  Procedure Laterality Date  . ABDOMINAL HYSTERECTOMY N/A 07/22/2014   Procedure: HYSTERECTOMY ABDOMINAL;  Surgeon: Terrance Mass, MD;  Location: [facility];  Service: Gynecology;  Laterality: N/A;  . CATARACT EXTRACTION W/ INTRAOCULAR LENS IMPLANT Left 2014  . CERVICAL CONIZATION W/BX N/A 03/12/2013   Procedure: COLD KNIFE CONIZATION CERVIX WITH BIOPSY/EXAM UNDER ANESTHESIA, RECTAL EXAM;  Surgeon: Alvino Chapel, MD;  Location: [facility];  Service: Gynecology;  Laterality: N/A;  . CERVICAL CONIZATION W/BX N/A 03/12/2013   Procedure: CONIZATION CERVIX WITH BIOPSY;  Surgeon: Alvino Chapel, MD;  Location: [facility];  Service: Gynecology;  Laterality: N/A;  patient returned to OR for fulgeration to stop bleeding  . CO2 LASER APPLICATION N/A 4/33/2951   Procedure: CO2 LASER OF THE VAGINA;  Surgeon: [name], MD;  Location: [facility];  Service: Gynecology;  Laterality: N/A;  . COLONOSCOPY  02-23-2010  . DILATION AND CURETTAGE OF UTERUS    . SALPINGOOPHORECTOMY Bilateral 07/22/2014   Procedure: BILATERAL SALPINGO OOPHORECTOMY;  Surgeon: Terrance Mass, MD;  Location: [facility];  Service: Gynecology;  Laterality: Bilateral;  . TUBAL LIGATION      FAMILY HISTORY: Family History  Problem Relation Age of Onset  . Hypertension Mother   . Diabetes Father   . Hypertension Father   . Heart disease Maternal Aunt        CHF    SOCIAL HISTORY: Social History   Socioeconomic History  . Marital status: Widowed    Spouse name: Not on file  . Number of children: 2  . Years of education: 33  . Highest education level: Not on file  Social Needs  . Financial resource strain: Not on file  . Food insecurity - worry: Not on file  . Food insecurity - inability: Not on file  . Transportation needs - medical: Not on file  . Transportation needs - non-medical: Not on file  Occupational History  . Occupation: retired     Tobacco Use  . Smoking status: Never Smoker  . Smokeless tobacco: Never Used  Substance and Sexual Activity  . Alcohol use: No    Alcohol/week: 0.0 oz  . Drug use: No  . Sexual activity: No  Other Topics Concern  . Not on file  Social History Narrative   Patient is widowed with 2 children   Patient is right handed   Patient has a high school education   Patient drinks 1 cup daily     PHYSICAL EXAM  Vitals:   08/09/17 1259  BP: 138/73  Pulse: 60  Weight: 179 lb 8 oz (81.4 kg)  Height: 5\' 9"  (1.753 m)   Body mass index is 26.51 kg/m. General: well developed, well nourished, seated, in no evident distress Head: head normocephalic and atraumatic. Oropharynx benign. Neck: supple with no carotid bruits. Neurologic Exam  Mental Status: Awake and  fully alert. Oriented to place and time. Follows all commands. Mood and affect appropriate. Cranial Nerves: Pupils equal, briskly reactive to light. Extraocular movements full without nystagmus. Visual fields full to confrontation. Hearing intact and symmetric to finger snap. Facial sensation intact. Face, tongue, palate move normally and symmetrically. Neck flexion and extension normal. Infrequent pursing and smiling movements of the lips. Rare blepharospasm noted.  Motor: Normal bulk and tone. Normal strength in all tested extremity muscles. Sensory: intact to light touch. Coordination: Rapid alternating movements normal in all extremities. Finger-to-nose and heel-to-shin performed accurately bilaterally. Gait and Station: Arises from chair without difficulty. Stance is normal. Tandem gait is normal Reflexes: 1+ and symmetric. Toes downgoing.   DIAGNOSTIC DATA (LABS, IMAGING, TESTING) - I reviewed patient records, labs, notes, testing and imaging myself where available.  Lab Results  Component Value Date   WBC 5.1 08/04/2017   HGB 13.5 08/04/2017   HCT 40.2 08/04/2017   MCV 87.5 08/04/2017   PLT 294.0  08/04/2017      Component Value Date/Time   NA 140 08/04/2017 1019   K 4.1 08/04/2017 1019   CL 101 08/04/2017 1019   CO2 29 08/04/2017 1019   GLUCOSE 97 08/04/2017 1019   BUN 15 08/04/2017 1019   CREATININE 1.18 08/04/2017 1019   CALCIUM 9.8 08/04/2017 1019   PROT 7.6 08/04/2017 1019   ALBUMIN 3.8 08/04/2017 1019   AST 20 08/04/2017 1019   ALT 16 08/04/2017 1019   ALKPHOS 86 08/04/2017 1019   BILITOT 0.5 08/04/2017 1019   GFRNONAA 45 (L) 07/23/2014 0525   GFRAA 52 (L) 07/23/2014 0525    ASSESSMENT AND PLAN  77 y.o. year old female  Blepharospasm   She reported allergic reaction to xeomin injection, face swells up,  I refilled her Artane 2 mg 1 and 1/2 tablets 3 times a day  She may continue to refill prescription through her primary care physician  Marcial Pacas, M.D. Ph.D.  [facility] [place], [place] [address] Phone: [phone] Fax:      [phone]

## 2017-11-15 DIAGNOSIS — H2511 Age-related nuclear cataract, right eye: Secondary | ICD-10-CM | POA: Diagnosis not present

## 2017-11-15 DIAGNOSIS — Z961 Presence of intraocular lens: Secondary | ICD-10-CM | POA: Diagnosis not present

## 2017-11-15 DIAGNOSIS — H401123 Primary open-angle glaucoma, left eye, severe stage: Secondary | ICD-10-CM | POA: Diagnosis not present

## 2017-11-28 ENCOUNTER — Encounter: Payer: Self-pay | Admitting: Obstetrics & Gynecology

## 2017-11-28 ENCOUNTER — Ambulatory Visit (INDEPENDENT_AMBULATORY_CARE_PROVIDER_SITE_OTHER): Payer: Medicare Other | Admitting: Obstetrics & Gynecology

## 2017-11-28 ENCOUNTER — Other Ambulatory Visit: Payer: Self-pay | Admitting: Obstetrics & Gynecology

## 2017-11-28 ENCOUNTER — Telehealth: Payer: Self-pay | Admitting: *Deleted

## 2017-11-28 ENCOUNTER — Other Ambulatory Visit: Payer: Self-pay | Admitting: Gynecology

## 2017-11-28 ENCOUNTER — Encounter: Payer: Self-pay | Admitting: Nurse Practitioner

## 2017-11-28 VITALS — BP 124/82 | Ht 67.0 in | Wt 183.0 lb

## 2017-11-28 DIAGNOSIS — Z01419 Encounter for gynecological examination (general) (routine) without abnormal findings: Secondary | ICD-10-CM

## 2017-11-28 DIAGNOSIS — Z13820 Encounter for screening for osteoporosis: Secondary | ICD-10-CM | POA: Diagnosis not present

## 2017-11-28 DIAGNOSIS — R140 Abdominal distension (gaseous): Secondary | ICD-10-CM

## 2017-11-28 DIAGNOSIS — M8589 Other specified disorders of bone density and structure, multiple sites: Secondary | ICD-10-CM | POA: Diagnosis not present

## 2017-11-28 DIAGNOSIS — Z90722 Acquired absence of ovaries, bilateral: Secondary | ICD-10-CM

## 2017-11-28 DIAGNOSIS — Z780 Asymptomatic menopausal state: Secondary | ICD-10-CM

## 2017-11-28 DIAGNOSIS — N891 Moderate vaginal dysplasia: Secondary | ICD-10-CM

## 2017-11-28 DIAGNOSIS — R87610 Atypical squamous cells of undetermined significance on cytologic smear of cervix (ASC-US): Secondary | ICD-10-CM | POA: Diagnosis not present

## 2017-11-28 DIAGNOSIS — Z90710 Acquired absence of both cervix and uterus: Secondary | ICD-10-CM

## 2017-11-28 DIAGNOSIS — Z86010 Personal history of colonic polyps: Secondary | ICD-10-CM

## 2017-11-28 DIAGNOSIS — Z9189 Other specified personal risk factors, not elsewhere classified: Secondary | ICD-10-CM

## 2017-11-28 DIAGNOSIS — Z124 Encounter for screening for malignant neoplasm of cervix: Secondary | ICD-10-CM

## 2017-11-28 DIAGNOSIS — Z9079 Acquired absence of other genital organ(s): Secondary | ICD-10-CM

## 2017-11-28 NOTE — Progress Notes (Signed)
Laurie Pratt 07/08/1941 [id number]   History:    77 y.o. H8I5O2D7.  Widowed.    RP:  Established patient presenting for annual gyn exam   HPI: S/P TAH/BSO.  CIN 2 on pathology of cervix from Hysterectomy in 2016.  VAIN 2 on Colpo 11/2015.  CO2 laser of Vagina with Dr Denman George 12/2015.  No pelvic pain.  C/O feeling bloated increasingly as the day progresses x a few months.  Normal BMs every 1-2 days.  No rectal bleeding.  Very little physical activity.  Urine normal.  Breasts wnl.  Health labs with Family MD.  Past medical history,surgical history, family history and social history were all reviewed and documented in the EPIC chart.  Gynecologic History No LMP recorded. Patient is postmenopausal. Contraception: status post hysterectomy Last Pap: 10/2015. Results were: abnormal.  Colposcopy VAIN 2 in 11/2015.  CO2 Laser of Vagina with Dr Denman George 12/2015. Last mammogram: 02/2013. Results were: Negative Bone Density: 11/2015 Osteopenia Colonoscopy: 2011, benign polyps  Obstetric History OB History  Gravida Para Term Preterm AB Living  4 3     1 3   SAB TAB Ectopic Multiple Live Births  1            # Outcome Date GA Lbr Len/2nd Weight Sex Delivery Anes PTL Lv  4 SAB           3 Para           2 Para           1 Para              ROS: A ROS was performed and pertinent positives and negatives are included in the history.  GENERAL: No fevers or chills. HEENT: No change in vision, no earache, sore throat or sinus congestion. NECK: No pain or stiffness. CARDIOVASCULAR: No chest pain or pressure. No palpitations. PULMONARY: No shortness of breath, cough or wheeze. GASTROINTESTINAL: No abdominal pain, nausea, vomiting or diarrhea, melena or bright red blood per rectum. GENITOURINARY: No urinary frequency, urgency, hesitancy or dysuria. MUSCULOSKELETAL: No joint or muscle pain, no back pain, no recent trauma. DERMATOLOGIC: No rash, no itching, no lesions. ENDOCRINE: No polyuria, polydipsia, no  heat or cold intolerance. No recent change in weight. HEMATOLOGICAL: No anemia or easy bruising or bleeding. NEUROLOGIC: No headache, seizures, numbness, tingling or weakness. PSYCHIATRIC: No depression, no loss of interest in normal activity or change in sleep pattern.     Exam:   BP 124/82   Ht 5\' 7"  (1.702 m)   Wt 183 lb (83 kg)   BMI 28.66 kg/m   Body mass index is 28.66 kg/m.  General appearance : Well developed well nourished female. No acute distress HEENT: Eyes: no retinal hemorrhage or exudates,  Neck supple, trachea midline, no carotid bruits, no thyroidmegaly Lungs: Clear to auscultation, no rhonchi or wheezes, or rib retractions  Heart: Regular rate and rhythm, no murmurs or gallops Breast:Examined in sitting and supine position were symmetrical in appearance, no palpable masses or tenderness,  no skin retraction, no nipple inversion, no nipple discharge, no skin discoloration, no axillary or supraclavicular lymphadenopathy Abdomen: no palpable masses or tenderness, no rebound or guarding Extremities: no edema or skin discoloration or tenderness  Pelvic: Vulva: Normal             Vagina: Short, scarred, atrophic.  Pap/HPV HR done.  Cervix/Uterus absent  Adnexa  Without masses or tenderness  Anus: Normal   Assessment/Plan:  77 y.o. female  for annual exam   1. Well female exam with routine gynecological exam Gynecologic exam and menopause status post TAH/BSO.  Very stenotic and short vagina.  Pap test with high risk HPV done.  Breast exam normal.  Will schedule screening mammogram.  Benign polyps on colonoscopy in 2011.  Recommend scheduling screening colonoscopy.  Health labs with family physician.  2. S/P TAH-BSO  3. Menopause present Well on no hormone replacement therapy.  4. VAIN II (vaginal intraepithelial neoplasia grade II) CIN-2 on pathology specimen from the hysterectomy.  Then developed V AIN 2 for which she received CO2 laser treatment with Dr. Denman George in  2017.  Pap test with high risk HPV done today.  5. Bloating Bloating probably of intestinal origin.  Patient is also due for screening colonoscopy.  Referred to gastroenterology.  6. Osteopenia of multiple sites Vitamin D supplements, calcium rich nutrition and regular weightbearing physical activity recommended.  Patient will follow up with a bone density.  7. Screening for osteoporosis Screening for osteoporosis with a bone density to schedule. - Bone density  8. Screening for malignant neoplasm of cervix - Pap with HR HPV done  Counseling on above issues and coordination of care more than 50% for 10 minutes.  Princess Bruins MD, 11:37 AM 11/28/2017

## 2017-11-28 NOTE — Telephone Encounter (Signed)
Referral placed at [facility] they will contact pt to schedule. 

## 2017-11-28 NOTE — Telephone Encounter (Signed)
-----   Message from Princess Bruins, MD sent at 11/28/2017 12:04 PM EDT ----- Regarding: Refer to Gastro Progressive feeling of bloating every day for a few months.  No constipation.  No rectal bleeding. Overdue for screening Colonoscopy.  Last Colono 2011 with h/o Polyps.

## 2017-11-29 NOTE — Telephone Encounter (Signed)
Pt scheduled 12/20/17 @ 9:30am with Dr.Guenther

## 2017-11-30 LAB — HUMAN PAPILLOMAVIRUS, HIGH RISK: HPV DNA HIGH RISK: DETECTED — AB

## 2017-11-30 LAB — PAP IG W/ RFLX HPV ASCU

## 2017-12-02 ENCOUNTER — Encounter: Payer: Self-pay | Admitting: Obstetrics & Gynecology

## 2017-12-02 NOTE — Patient Instructions (Signed)
1. Well female exam with routine gynecological exam Gynecologic exam and menopause status post TAH/BSO.  Very stenotic and short vagina.  Pap test with high risk HPV done.  Breast exam normal.  Will schedule screening mammogram.  Benign polyps on colonoscopy in 2011.  Recommend scheduling screening colonoscopy.  Health labs with family physician.  2. S/P TAH-BSO  3. Menopause present Well on no hormone replacement therapy.  4. VAIN II (vaginal intraepithelial neoplasia grade II) CIN-2 on pathology specimen from the hysterectomy.  Then developed V AIN 2 for which she received CO2 laser treatment with Dr. Denman George in 2017.  Pap test with high risk HPV done today.  5. Bloating Bloating probably of intestinal origin.  Patient is also due for screening colonoscopy.  Referred to gastroenterology.  6. Osteopenia of multiple sites Vitamin D supplements, calcium rich nutrition and regular weightbearing physical activity recommended.  Patient will follow up with a bone density.  7. Screening for osteoporosis Screening for osteoporosis with a bone density to schedule. - Bone density  8. Screening for malignant neoplasm of cervix - Pap with HR HPV done  Laurie Pratt, it was a pleasure meeting you today!  I will inform you of your results as soon as they are available.

## 2017-12-09 DIAGNOSIS — M8580 Other specified disorders of bone density and structure, unspecified site: Secondary | ICD-10-CM | POA: Insufficient documentation

## 2017-12-09 HISTORY — DX: Other specified disorders of bone density and structure, unspecified site: M85.80

## 2017-12-12 ENCOUNTER — Ambulatory Visit (INDEPENDENT_AMBULATORY_CARE_PROVIDER_SITE_OTHER): Payer: Medicare Other

## 2017-12-12 ENCOUNTER — Encounter: Payer: Self-pay | Admitting: Gynecology

## 2017-12-12 DIAGNOSIS — M8589 Other specified disorders of bone density and structure, multiple sites: Secondary | ICD-10-CM | POA: Diagnosis not present

## 2017-12-12 DIAGNOSIS — Z780 Asymptomatic menopausal state: Secondary | ICD-10-CM | POA: Diagnosis not present

## 2017-12-20 ENCOUNTER — Other Ambulatory Visit (INDEPENDENT_AMBULATORY_CARE_PROVIDER_SITE_OTHER): Payer: Medicare Other

## 2017-12-20 ENCOUNTER — Encounter: Payer: Self-pay | Admitting: Nurse Practitioner

## 2017-12-20 ENCOUNTER — Ambulatory Visit (INDEPENDENT_AMBULATORY_CARE_PROVIDER_SITE_OTHER): Payer: Medicare Other | Admitting: Nurse Practitioner

## 2017-12-20 VITALS — BP 130/72 | HR 60 | Ht 67.0 in | Wt 182.6 lb

## 2017-12-20 DIAGNOSIS — R140 Abdominal distension (gaseous): Secondary | ICD-10-CM

## 2017-12-20 DIAGNOSIS — R1033 Periumbilical pain: Secondary | ICD-10-CM

## 2017-12-20 LAB — CBC
HCT: 39.8 % (ref 36.0–46.0)
Hemoglobin: 13.6 g/dL (ref 12.0–15.0)
MCHC: 34.1 g/dL (ref 30.0–36.0)
MCV: 87.2 fl (ref 78.0–100.0)
PLATELETS: 319 10*3/uL (ref 150.0–400.0)
RBC: 4.57 Mil/uL (ref 3.87–5.11)
RDW: 13.2 % (ref 11.5–15.5)
WBC: 5.1 10*3/uL (ref 4.0–10.5)

## 2017-12-20 NOTE — Patient Instructions (Signed)
If you are age 75 or older, your body mass index should be between 23-30. Your Body mass index is 28.6 kg/m. If this is out of the aforementioned range listed, please consider follow up with your Primary Care Provider.  If you are age 65 or younger, your body mass index should be between 19-25. Your Body mass index is 28.6 kg/m. If this is out of the aformentioned range listed, please consider follow up with your Primary Care Provider.   Your provider has requested that you go to the basement level for lab work before leaving today. Press "B" on the elevator. The lab is located at the first door on the left as you exit the elevator.  You have been scheduled for an abdominal ultrasound at [facility] (1st floor of hospital) on 12/25/17 at 10 am. Please arrive 15 minutes prior to your appointment for registration. Make certain not to have anything to eat or drink 8 hours prior to your appointment. Should you need to reschedule your appointment, please contact radiology at [phone]. This test typically takes about 30 minutes to perform.  Thank you for choosing me and [facility].   Tye Savoy, NP

## 2017-12-20 NOTE — Progress Notes (Addendum)
Note didn't get routed to Dr Silverio Decamp upon signing so will re-route.  Chief Complaint: bloating  Referring Provider:     Princess Bruins, MD  ASSESSMENT AND PLAN;   1. 77 yo female with two month hx of bloating / intermittent periumbilical discomfort..She feels like there is fluid in her belly. I can't appreciate ascites on exam. Not areally distended either.  She is s/p TAH / BSO -Will obtain abdominal ultrasound to rule out ascites though it seems unlikldy.  -cbc -will call her with results  2. Colon cancer screening. Due for colonoscopy Aug 2021 depending on health status at that time. Marland Kitchen    HPI:     Patient is a 77 yo female previously followed by Olevia Perches. Last colonoscopy was Aug 2011, hyperplastic polyps removed. Due for repeat colonoscopy in two years.  Patient referred by GYN for bloating. Abdomen feels relatively flat in am and swells as day progresses and this has been happening for about 2 months. Bloating not particularly worse with any certain foods. Feels like there is fluid in her belly. Occasional swelling in legs,  better with reduction of salt. .She has intermittent nagging periumbilical discomfort with the bloating. Discomfort not relieved with defecation and she isn't constipated. Has a  BM at least every other day, had two yesterday. Some occasional mild postprandial nausea with the bloating. No unusual bleching / flatus.  Appetite is fine. No new meds or dietary changes.Weight is stable over last 6 months. . She is s/p TAH/ BSO a few years ago. No urinary sx. .    Past Medical History:  Diagnosis Date  . Glaucoma   . History of cervical dysplasia    CIN III  w Severe dysplasia  s/p  cervical cone bx 09/ 2014  . History of colon polyps    hyperplastic 08/ 2011  . Hyperlipidemia    diet controlled - no meds  . Hypertension   . Neuromuscular disorder ([place])   . Osteopenia 12/2017   T score -1.7 FRAX 5.1% / 1.0%  . Tics of organic origin    Dr. Doy Mince  .  VAIN II (vaginal intraepithelial neoplasia grade II)   . Wears glasses      Past Surgical History:  Procedure Laterality Date  . ABDOMINAL HYSTERECTOMY N/A 07/22/2014   Procedure: HYSTERECTOMY ABDOMINAL;  Surgeon: Terrance Mass, MD;  Location: [facility];  Service: Gynecology;  Laterality: N/A;  . CATARACT EXTRACTION W/ INTRAOCULAR LENS IMPLANT Left 2014  . CERVICAL CONIZATION W/BX N/A 03/12/2013   Procedure: COLD KNIFE CONIZATION CERVIX WITH BIOPSY/EXAM UNDER ANESTHESIA, RECTAL EXAM;  Surgeon: Alvino Chapel, MD;  Location: [facility];  Service: Gynecology;  Laterality: N/A;  . CERVICAL CONIZATION W/BX N/A 03/12/2013   Procedure: CONIZATION CERVIX WITH BIOPSY;  Surgeon: Alvino Chapel, MD;  Location: [facility];  Service: Gynecology;  Laterality: N/A;  patient returned to OR for fulgeration to stop bleeding  . CO2 LASER APPLICATION N/A 9/51/8841   Procedure: CO2 LASER OF THE VAGINA;  Surgeon: [name], MD;  Location: [facility];  Service: Gynecology;  Laterality: N/A;  . COLONOSCOPY  02-23-2010  . DILATION AND CURETTAGE OF UTERUS    . SALPINGOOPHORECTOMY Bilateral 07/22/2014   Procedure: BILATERAL SALPINGO OOPHORECTOMY;  Surgeon: Terrance Mass, MD;  Location: [facility];  Service: Gynecology;  Laterality: Bilateral;  . TUBAL LIGATION     Family History  Problem Relation Age of Onset  . Hypertension Mother   .  Diabetes Father   . Hypertension Father   . Heart disease Maternal Aunt        CHF  . Ovarian cancer Sister   . Colon cancer Neg Hx   . Esophageal cancer Neg Hx   . Pancreatic cancer Neg Hx   . Stomach cancer Neg Hx    Social History   Tobacco Use  . Smoking status: Never Smoker  . Smokeless tobacco: Never Used  Substance Use Topics  . Alcohol use: Yes    Alcohol/week: 0.0 oz    Comment: rare; 1 beer per year  . Drug use: No   Current Outpatient Medications  Medication Sig Dispense Refill  .  atenolol-chlorthalidone (TENORETIC) 100-25 MG tablet Take 1 tablet by mouth every morning. 90 tablet 3  . Cholecalciferol (VITAMIN D3) 1000 units CAPS Take 1 capsule by mouth every morning.    . latanoprost (XALATAN) 0.005 % ophthalmic solution Place 1 drop into both eyes at bedtime.    Marland Kitchen losartan (COZAAR) 100 MG tablet Take 1 tablet (100 mg total) by mouth every morning. 90 tablet 3  . trihexyphenidyl (ARTANE) 2 MG tablet Take 1.5 tablets (3 mg total) by mouth 3 (three) times daily. 410 tablet 4   No current facility-administered medications for this visit.    Allergies  Allergen Reactions  . Ace Inhibitors Other (See Comments)    cough  . Doxazosin Mesylate     Unknown reaction  . Verapamil Swelling    REACTION: legs swelling, tired     Review of Systems: Occasional SOB associated with bloating. All otherl systems reviewed and negative except where noted in HPI.   Creatinine clearance cannot be calculated (Patient's most recent lab result is older than the maximum 21 days allowed.)   Physical Exam:    Wt Readings from Last 3 Encounters:  12/20/17 182 lb 9.6 oz (82.8 kg)  11/28/17 183 lb (83 kg)  08/09/17 179 lb 8 oz (81.4 kg)    BP 130/72   Pulse 60   Ht 5\' 7"  (1.702 m)   Wt 182 lb 9.6 oz (82.8 kg)   BMI 28.60 kg/m  Constitutional:  Pleasant female in no acute distress. Psychiatric: Normal mood and affect. Behavior is normal. EENT: Pupils normal.  Conjunctivae are normal. No scleral icterus. Neck supple.  Cardiovascular: Normal rate, regular rhythm. No edema Pulmonary/chest: Effort normal and breath sounds normal. No wheezing, rales or rhonchi. Abdominal: Soft, upper abdomen may be mildly distended. Nondistended, nontender. Bowel sounds active throughout. There are no masses palpable. No hepatomegaly. Neurological: Alert and oriented to person place and time. Skin: Skin is warm and dry. No rashes noted.  Tye Savoy, NP  12/20/2017, 9:27 AM  Cc:   Princess Bruins, MD

## 2017-12-25 ENCOUNTER — Ambulatory Visit (HOSPITAL_COMMUNITY)
Admission: RE | Admit: 2017-12-25 | Discharge: 2017-12-25 | Disposition: A | Discharge status: Home / Self Care | Payer: Medicare Other | Source: Ambulatory Visit | Attending: Nurse Practitioner | Admitting: Nurse Practitioner

## 2017-12-25 ENCOUNTER — Encounter: Payer: Self-pay | Admitting: Nurse Practitioner

## 2017-12-25 DIAGNOSIS — N281 Cyst of kidney, acquired: Secondary | ICD-10-CM | POA: Insufficient documentation

## 2017-12-25 DIAGNOSIS — K8020 Calculus of gallbladder without cholecystitis without obstruction: Secondary | ICD-10-CM | POA: Insufficient documentation

## 2017-12-25 DIAGNOSIS — R140 Abdominal distension (gaseous): Secondary | ICD-10-CM

## 2017-12-25 DIAGNOSIS — R1033 Periumbilical pain: Secondary | ICD-10-CM | POA: Diagnosis not present

## 2018-01-02 ENCOUNTER — Emergency Department (HOSPITAL_COMMUNITY)
Admission: EM | Admit: 2018-01-02 | Discharge: 2018-01-02 | Disposition: A | Discharge status: Home / Self Care | Payer: Medicare Other | Attending: Emergency Medicine | Admitting: Emergency Medicine

## 2018-01-02 ENCOUNTER — Encounter (HOSPITAL_COMMUNITY): Payer: Self-pay | Admitting: Emergency Medicine

## 2018-01-02 ENCOUNTER — Emergency Department (HOSPITAL_COMMUNITY): Payer: Medicare Other

## 2018-01-02 DIAGNOSIS — R001 Bradycardia, unspecified: Secondary | ICD-10-CM | POA: Diagnosis not present

## 2018-01-02 DIAGNOSIS — R109 Unspecified abdominal pain: Secondary | ICD-10-CM | POA: Insufficient documentation

## 2018-01-02 DIAGNOSIS — R1033 Periumbilical pain: Secondary | ICD-10-CM | POA: Diagnosis not present

## 2018-01-02 DIAGNOSIS — I10 Essential (primary) hypertension: Secondary | ICD-10-CM | POA: Diagnosis not present

## 2018-01-02 DIAGNOSIS — R140 Abdominal distension (gaseous): Secondary | ICD-10-CM | POA: Diagnosis not present

## 2018-01-02 DIAGNOSIS — Z79899 Other long term (current) drug therapy: Secondary | ICD-10-CM | POA: Insufficient documentation

## 2018-01-02 DIAGNOSIS — K8020 Calculus of gallbladder without cholecystitis without obstruction: Secondary | ICD-10-CM | POA: Diagnosis not present

## 2018-01-02 DIAGNOSIS — R110 Nausea: Secondary | ICD-10-CM | POA: Diagnosis not present

## 2018-01-02 LAB — URINALYSIS, ROUTINE W REFLEX MICROSCOPIC
BACTERIA UA: NONE SEEN
Bilirubin Urine: NEGATIVE
Glucose, UA: NEGATIVE mg/dL
Hgb urine dipstick: NEGATIVE
KETONES UR: NEGATIVE mg/dL
Nitrite: NEGATIVE
PROTEIN: NEGATIVE mg/dL
Specific Gravity, Urine: 1.044 — ABNORMAL HIGH (ref 1.005–1.030)
pH: 6 (ref 5.0–8.0)

## 2018-01-02 LAB — COMPREHENSIVE METABOLIC PANEL
ALT: 16 U/L (ref 0–44)
AST: 20 U/L (ref 15–41)
Albumin: 3.5 g/dL (ref 3.5–5.0)
Alkaline Phosphatase: 83 U/L (ref 38–126)
Anion gap: 9 (ref 5–15)
BUN: 17 mg/dL (ref 8–23)
CO2: 27 mmol/L (ref 22–32)
Calcium: 9.7 mg/dL (ref 8.9–10.3)
Chloride: 105 mmol/L (ref 98–111)
Creatinine, Ser: 1.39 mg/dL — ABNORMAL HIGH (ref 0.44–1.00)
GFR calc Af Amer: 42 mL/min — ABNORMAL LOW (ref >60)
GFR calc non Af Amer: 36 mL/min — ABNORMAL LOW (ref >60)
Glucose, Bld: 101 mg/dL — ABNORMAL HIGH (ref 70–99)
Potassium: 3.5 mmol/L (ref 3.5–5.1)
Sodium: 141 mmol/L (ref 135–145)
Total Bilirubin: 0.7 mg/dL (ref 0.3–1.2)
Total Protein: 7.5 g/dL (ref 6.5–8.1)

## 2018-01-02 LAB — LIPASE, BLOOD: Lipase: 40 U/L (ref 11–51)

## 2018-01-02 LAB — CBC WITH DIFFERENTIAL/PLATELET
Abs Immature Granulocytes: 0 10*3/uL (ref 0.0–0.1)
Basophils Absolute: 0 10*3/uL (ref 0.0–0.1)
Basophils Relative: 1 %
Eosinophils Absolute: 0.1 10*3/uL (ref 0.0–0.7)
Eosinophils Relative: 3 %
HCT: 43 % (ref 36.0–46.0)
Hemoglobin: 13.6 g/dL (ref 12.0–15.0)
Immature Granulocytes: 0 %
Lymphocytes Relative: 33 %
Lymphs Abs: 1.4 10*3/uL (ref 0.7–4.0)
MCH: 28.1 pg (ref 26.0–34.0)
MCHC: 31.6 g/dL (ref 30.0–36.0)
MCV: 88.8 fL (ref 78.0–100.0)
Monocytes Absolute: 0.6 10*3/uL (ref 0.1–1.0)
Monocytes Relative: 13 %
Neutro Abs: 2.2 10*3/uL (ref 1.7–7.7)
Neutrophils Relative %: 50 %
Platelets: 279 10*3/uL (ref 150–400)
RBC: 4.84 MIL/uL (ref 3.87–5.11)
RDW: 13.4 % (ref 11.5–15.5)
WBC: 4.4 10*3/uL (ref 4.0–10.5)

## 2018-01-02 MED ORDER — IOHEXOL 300 MG/ML  SOLN
100.0000 mL | Freq: Once | INTRAMUSCULAR | Status: AC | PRN
  Administered 2018-01-02: 100 mL via INTRAVENOUS

## 2018-01-02 NOTE — Discharge Instructions (Addendum)
You were seen in the ER for abdominal pain. Your labs were all reassuring. Your CT scan showed findings of gall stones and a small adrenal adenoma (a benign mass that is small in size and unlikely related to your pain. We are unsure of the exact cause of your abdominal pain.   We would like you to follow up with the GI specialist you were originally seeing, please do this as previously scheduled on July 12th or call for an appointment sooner. Return to the ER for any new or worsening symptoms such as worsened or more persistent pain, inability to keep fluids down, fever, blood in your stool or any other concerns.

## 2018-01-02 NOTE — ED Provider Notes (Signed)
[facility] EMERGENCY DEPARTMENT Provider Note   CSN: [id number] Arrival date & time: 01/02/18  1137     History   Chief Complaint Chief Complaint  Patient presents with  . Abdominal Pain    HPI VICCI REDER is a 77 y.o. female with a hx of osteopenia, anxiety, hyperlipidemia, HTN, cervical cancer s/p abdominal hysterectomy and bilateral salpingoophorectomy who presents to the ED with complaints of intermittent abdominal pain for the past 2 months.  Patient states the pain occurs intermittently, not necessarily daily, however can occur 2-3 times per day.  She states the pain is located in the periumbilical and lower abdomen.  Pain is a cramping sensation.  States it mostly occurs after eating, however it can occur without eating.  Pain will last for approximately 15 minutes prior to resolving. She is not in pain at present. Will occasionally have some associated nausea without vomiting. She additionally mentions she feels that her abdomen swells throughout the day then improves over night. Patient was seen by Tye Savoy NP with gastroenterology for abdominal pain 12/20/17- plan for CBC and abdominal US to r/o ascites- [place] without ascites, there is mention of cholelithiasis and small right renal cyst, otherwise normal exam.  Patient with plans to follow-up on July 12th. She has not tried medications for this. She denies diarrhea, constipation, melena, or hematochezia. She states she has bowel movements daily to every other day. Denies fever, chills, dysuria, vaginal bleeding, or vaginal discharge. She is unsure of last colonoscopy date but knows that this was normal and that she needs one in 2021. Denies EtOH use, increase in NSAIDs, foreign travel, or recent hospitalizations.     HPI  Past Medical History:  Diagnosis Date  . Glaucoma   . History of cervical dysplasia    CIN III  w Severe dysplasia  s/p  cervical cone bx 09/ 2014  . History of colon polyps    hyperplastic 08/ 2011  . Hyperlipidemia    diet controlled - no meds  . Hypertension   . Neuromuscular disorder ([place])   . Osteopenia 12/2017   T score -1.7 FRAX 5.1% / 1.0%  . Tics of organic origin    Dr. Doy Mince  . VAIN II (vaginal intraepithelial neoplasia grade II)   . Wears glasses     Patient Active Problem List   Diagnosis Date Noted  . Edema 08/03/2016  . Anxiety 06/23/2016  . Blepharospasm 03/24/2016  . Hemifacial spasm 03/24/2016  . VAIN II (vaginal intraepithelial neoplasia grade II) 12/23/2015  . Well adult exam 07/08/2015  . Preop exam for internal medicine 06/09/2014  . CIN III (cervical intraepithelial neoplasia grade III) with severe dysplasia 05/02/2014  . History of colonic polyps 04/16/2014  . Abnormal involuntary movement 01/30/2014  . Severe cervical dysplasia, histologically confirmed 05/03/2013  . Cervical cancer ([place]) 02/26/2013  . Tic disorder 07/07/2009  . EAR PAIN 04/18/2008  . FATIGUE 01/15/2008  . Dyslipidemia 07/04/2007  . Essential hypertension 07/04/2007    Past Surgical History:  Procedure Laterality Date  . ABDOMINAL HYSTERECTOMY N/A 07/22/2014   Procedure: HYSTERECTOMY ABDOMINAL;  Surgeon: Terrance Mass, MD;  Location: [facility];  Service: Gynecology;  Laterality: N/A;  . CATARACT EXTRACTION W/ INTRAOCULAR LENS IMPLANT Left 2014  . CERVICAL CONIZATION W/BX N/A 03/12/2013   Procedure: COLD KNIFE CONIZATION CERVIX WITH BIOPSY/EXAM UNDER ANESTHESIA, RECTAL EXAM;  Surgeon: Alvino Chapel, MD;  Location: [facility];  Service: Gynecology;  Laterality: N/A;  . CERVICAL  CONIZATION W/BX N/A 03/12/2013   Procedure: CONIZATION CERVIX WITH BIOPSY;  Surgeon: Alvino Chapel, MD;  Location: [facility];  Service: Gynecology;  Laterality: N/A;  patient returned to OR for fulgeration to stop bleeding  . CO2 LASER APPLICATION N/A 3/53/6144   Procedure: CO2 LASER OF THE VAGINA;  Surgeon: [name], MD;   Location: [facility];  Service: Gynecology;  Laterality: N/A;  . COLONOSCOPY  02-23-2010  . DILATION AND CURETTAGE OF UTERUS    . SALPINGOOPHORECTOMY Bilateral 07/22/2014   Procedure: BILATERAL SALPINGO OOPHORECTOMY;  Surgeon: Terrance Mass, MD;  Location: [facility];  Service: Gynecology;  Laterality: Bilateral;  . TUBAL LIGATION       OB History    Gravida  4   Para  3   Term      Preterm      AB  1   Living  3     SAB  1   TAB      Ectopic      Multiple      Live Births               Home Medications    Prior to Admission medications   Medication Sig Start Date End Date Taking? Authorizing Provider  atenolol-chlorthalidone (TENORETIC) 100-25 MG tablet Take 1 tablet by mouth every morning. 08/04/17   Plotnikov, Evie Lacks, MD  Cholecalciferol (VITAMIN D3) 1000 units CAPS Take 1 capsule by mouth every morning.    [provider]  latanoprost (XALATAN) 0.005 % ophthalmic solution Place 1 drop into both eyes at bedtime.    [provider]  losartan (COZAAR) 100 MG tablet Take 1 tablet (100 mg total) by mouth every morning. 08/04/17   Plotnikov, Evie Lacks, MD  trihexyphenidyl (ARTANE) 2 MG tablet Take 1.5 tablets (3 mg total) by mouth 3 (three) times daily. 08/09/17   Marcial Pacas, MD    Family History Family History  Problem Relation Age of Onset  . Hypertension Mother   . Diabetes Father   . Hypertension Father   . Heart disease Maternal Aunt        CHF  . Ovarian cancer Sister   . Colon cancer Neg Hx   . Esophageal cancer Neg Hx   . Pancreatic cancer Neg Hx   . Stomach cancer Neg Hx     Social History Social History   Tobacco Use  . Smoking status: Never Smoker  . Smokeless tobacco: Never Used  Substance Use Topics  . Alcohol use: Yes    Alcohol/week: 0.0 oz    Comment: rare; 1 beer per year  . Drug use: No     Allergies   Ace inhibitors; Doxazosin mesylate; and Verapamil   Review of Systems Review of  Systems  Constitutional: Negative for chills and fever.  Respiratory: Negative for shortness of breath.   Cardiovascular: Negative for chest pain.  Gastrointestinal: Positive for abdominal distention ("swelling"), abdominal pain and nausea. Negative for anal bleeding, blood in stool, constipation, diarrhea and vomiting.  Genitourinary: Negative for enuresis, vaginal bleeding and vaginal discharge.  All other systems reviewed and are negative.    Physical Exam Updated Vital Signs BP (!) 164/70 (BP Location: Right Arm)   Pulse (!) 58   Temp 98.5 F (36.9 C) (Oral)   Resp 18   SpO2 100%   Physical Exam  Constitutional: She appears well-developed and well-nourished. No distress.  HENT:  Head: Normocephalic and atraumatic.  Eyes: Conjunctivae are  normal. Right eye exhibits no discharge. Left eye exhibits no discharge.  Cardiovascular: Regular rhythm. Bradycardia present.  No murmur heard. Pulmonary/Chest: Breath sounds normal. No respiratory distress. She has no wheezes. She has no rales.  Abdominal: Soft. Normal appearance. She exhibits no distension. There is tenderness (very mild, periumbilical and lower abdomen). There is no rigidity, no rebound, no guarding and negative Murphy's sign.  No appreciable ascites  Neurological: She is alert.  Clear speech.   Skin: Skin is warm and dry. No rash noted.  Psychiatric: She has a normal mood and affect. Her behavior is normal.  Nursing note and vitals reviewed.   ED Treatments / Results  Labs Results for orders placed or performed during the hospital encounter of 01/02/18  Lipase, blood  Result Value Ref Range   Lipase 40 11 - 51 U/L  Comprehensive metabolic panel  Result Value Ref Range   Sodium 141 135 - 145 mmol/L   Potassium 3.5 3.5 - 5.1 mmol/L   Chloride 105 98 - 111 mmol/L   CO2 27 22 - 32 mmol/L   Glucose, Bld 101 (H) 70 - 99 mg/dL   BUN 17 8 - 23 mg/dL   Creatinine, Ser 1.39 (H) 0.44 - 1.00 mg/dL   Calcium 9.7 8.9 -  10.3 mg/dL   Total Protein 7.5 6.5 - 8.1 g/dL   Albumin 3.5 3.5 - 5.0 g/dL   AST 20 15 - 41 U/L   ALT 16 0 - 44 U/L   Alkaline Phosphatase 83 38 - 126 U/L   Total Bilirubin 0.7 0.3 - 1.2 mg/dL   GFR calc non Af Amer 36 (L) >60 mL/min   GFR calc Af Amer 42 (L) >60 mL/min   Anion gap 9 5 - 15  CBC with Differential  Result Value Ref Range   WBC 4.4 4.0 - 10.5 K/uL   RBC 4.84 3.87 - 5.11 MIL/uL   Hemoglobin 13.6 12.0 - 15.0 g/dL   HCT 43.0 36.0 - 46.0 %   MCV 88.8 78.0 - 100.0 fL   MCH 28.1 26.0 - 34.0 pg   MCHC 31.6 30.0 - 36.0 g/dL   RDW 13.4 11.5 - 15.5 %   Platelets 279 150 - 400 K/uL   Neutrophils Relative % 50 %   Neutro Abs 2.2 1.7 - 7.7 K/uL   Lymphocytes Relative 33 %   Lymphs Abs 1.4 0.7 - 4.0 K/uL   Monocytes Relative 13 %   Monocytes Absolute 0.6 0.1 - 1.0 K/uL   Eosinophils Relative 3 %   Eosinophils Absolute 0.1 0.0 - 0.7 K/uL   Basophils Relative 1 %   Basophils Absolute 0.0 0.0 - 0.1 K/uL   Immature Granulocytes 0 %   Abs Immature Granulocytes 0.0 0.0 - 0.1 K/uL    EKG None  Radiology Ct Abdomen Pelvis W Contrast  Result Date: 01/02/2018 CLINICAL DATA:  Abdominal pain with mild nausea EXAM: CT ABDOMEN AND PELVIS WITH CONTRAST TECHNIQUE: Multidetector CT imaging of the abdomen and pelvis was performed using the standard protocol following bolus administration of intravenous contrast. CONTRAST:  148mL OMNIPAQUE IOHEXOL 300 MG/ML  SOLN COMPARISON:  12/25/2017 FINDINGS: Lower chest: Minor dependent bibasilar atelectasis. No pericardial or pleural effusion. Normal heart size. No hiatal hernia. Degenerative changes of the thoracic spine with large osteophytes on the right. Hepatobiliary: No focal hepatic abnormality. No biliary dilatation. Hepatic and portal veins are patent. Cholelithiasis noted, gallstone measures 1.9 cm. No gallbladder distention or surrounding inflammatory process. Common bile duct not  dilated. Pancreas: Unremarkable. No pancreatic ductal  dilatation or surrounding inflammatory changes. Spleen: Normal in size without focal abnormality. Adrenals/Urinary Tract: 12 mm left adrenal nodule, image 22 series 3 suspect small adenoma. Right adrenal gland appears normal. Kidneys demonstrate mild diffuse cortical scarring/atrophy. Small subcentimeter right renal cyst in the midpole laterally. No renal obstruction or hydronephrosis. Ureters are symmetric and decompressed. No obstructing urinary tract or ureteral calculus. Bladder unremarkable. Stomach/Bowel: Negative for bowel obstruction, significant dilatation, ileus, or free air. Normal appendix demonstrated. Distal colon collapsed. No acute inflammatory process, fluid collection, or abscess. Vascular/Lymphatic: Negative for aneurysm. No aortoiliac occlusive process. Mesenteric and renal vasculature appear patent. Pelvic iliac vessels are patent. No adenopathy. Reproductive: Remote hysterectomy. No pelvic or adnexal mass. No free fluid. Other: No abdominal wall hernia or abnormality. No abdominopelvic ascites. Musculoskeletal: Minor degenerative changes of the lower lumbar spine SI joints. Lower lumbar facet arthropathy noted. No acute osseous finding. IMPRESSION: Cholelithiasis Suspect small left adrenal adenoma Normal appendix Remote hysterectomy No acute intra-abdominal or pelvic finding by CT. Electronically Signed   By: Jerilynn Mages.  Shick M.D.   On: 01/02/2018 14:56    Procedures Procedures (including critical care time)  Medications Ordered in ED Medications - No data to display  Prior Results Reviewed:  Complete Abdominal US: 12/25/17 IMPRESSION: Cholelithiasis.  Small RIGHT renal cyst 10 mm diameter.  Otherwise normal exam.   Electronically Signed   By: Lavonia Dana M.D.   On: 12/25/2017 13:42  Initial Impression / Assessment and Plan / ED Course  I have reviewed the triage vital signs and the nursing notes.  Pertinent labs & imaging results that were available during my care of the  patient were reviewed by me and considered in my medical decision making (see chart for details).   Patient presents to the emergency department with complaints of intermittent abdominal pain for the past 2 months.  She will occasionally have associated nausea, she also feels that her abdomen swells throughout the day. Patient nontoxic appearing, in no apparent distress, mild bradycardia and elevated BP, otherwise vitals WNL, doubt HTN emergency, patient aware of need for recheck. On exam patient has very mild periumbilical and lower abdominal tenderness. No peritoneal signs. DDX: cholecystitis, symptomatic cholelithiasis, GERD, diverticulitis, appendicitis, pancreatitis, bowel obstruction/perforation, cancerous etiology.   Labs per triage reviewed: No leukocytosis. No anemia. No significant electrolyte abnormalities. Lipase and LFTs WNL. Renal function appears at baseline. UA without obvious infection, she does have elevated specific gravity- will encourage PO fluids. CT abdomen/pelvis obtained-  Findings of cholelithiasis, suspected small left adrenal adenoma. No acute intra-abdominal or pelvic findings. On repeat exam patient without peritoneal signs, does not appear to have a surgical abdomen. Unclear definitive etiology of patient's symptoms however she appears hemodynamically stable and safe for discharge with GI follow up as planned. I discussed results, treatment plan, need for follow-up, and return precautions with the patient. Provided opportunity for questions, patient confirmed understanding and is in agreement with plan.   Findings and plan of care discussed with supervising physician Dr. Wilson Singer who is in agreement with plan.   Vitals:   01/02/18 1556 01/02/18 1600  BP: (!) 165/79 134/75  Pulse: 62 (!) 59  Resp: 18 16  Temp:    SpO2: 96% 98%    Final Clinical Impressions(s) / ED Diagnoses   Final diagnoses:  Abdominal pain, unspecified abdominal location    ED Discharge Orders     None       Amaryllis Dyke, PA-C 01/02/18 2142  Virgel Manifold, MD 01/03/18 1251

## 2018-01-02 NOTE — ED Notes (Signed)
Pt back from CT

## 2018-01-02 NOTE — ED Triage Notes (Signed)
Centralized belly pain for months she has seen PCP for. No GI yet. Here for evaluation. Mild nausea at times.

## 2018-01-03 LAB — URINE CULTURE: Culture: <10000 — AB

## 2018-01-09 ENCOUNTER — Ambulatory Visit (INDEPENDENT_AMBULATORY_CARE_PROVIDER_SITE_OTHER): Payer: Medicare Other | Admitting: Obstetrics & Gynecology

## 2018-01-09 ENCOUNTER — Encounter: Payer: Self-pay | Admitting: Obstetrics & Gynecology

## 2018-01-09 VITALS — BP 140/84

## 2018-01-09 DIAGNOSIS — N891 Moderate vaginal dysplasia: Secondary | ICD-10-CM | POA: Diagnosis not present

## 2018-01-09 DIAGNOSIS — R87811 Vaginal high risk human papillomavirus (HPV) DNA test positive: Secondary | ICD-10-CM | POA: Diagnosis not present

## 2018-01-09 DIAGNOSIS — R87620 Atypical squamous cells of undetermined significance on cytologic smear of vagina (ASC-US): Secondary | ICD-10-CM

## 2018-01-09 NOTE — Progress Notes (Signed)
    Laurie Pratt 10/30/1940 [id number]        77 y.o.  [id number]   RP: ASCUS/HPV HR pos, h/o VAIN 2  HPI: Status post  TAH/BSO with CIN-2 in 2016.  Last Pap test Nov 28, 2017 showed ASCUS with positive high risk HPV at the vaginal vault.  Patient has a history of VAIN 2 treated with CO2 laser 12/2015.   OB History  Gravida Para Term Preterm AB Living  4 3     1 3   SAB TAB Ectopic Multiple Live Births  1            # Outcome Date GA Lbr Len/2nd Weight Sex Delivery Anes PTL Lv  4 SAB           3 Para           2 Para           1 Para             Past medical history,surgical history, problem list, medications, allergies, family history and social history were all reviewed and documented in the EPIC chart.   Directed ROS with pertinent positives and negatives documented in the history of present illness/assessment and plan.  Exam:  Vitals:   01/09/18 1608  BP: 140/84   General appearance:  Normal  Colposcopy Procedure Note JILLIAM BELLMORE 01/09/2018  Indications:  ASCUS/HPV HR pos.  H/O VAIN2 post CO2 Laser of Vagina  Procedure Details  The risks and benefits of the procedure and Verbal informed consent obtained.  Speculum placed in vagina and excellent visualization of the vaginal vault, swabbed x 3 with acetic acid solution.  Findings:  Vaginal colposcopy:  AW with punctation mid vaginal vault.  Bx taken.  Silver Nitrate.  Vulvar colposcopy: Grossly normal  Perirectal colposcopy: Grossly normal  The vagina was sprayed with Hurricane before performing the vaginal biopsy.  Specimens: Mid vaginal vault Bx  Complications: None . Plan: Per Vaginal Bx result.    Assessment/Plan:  77 y.o. G4P0013   1. ASCUS with positive high risk human papillomavirus of vagina Vaginal colposcopy compatible with VAIN 1, vaginal biopsy pending.  Findings on colposcopy discussed with patient.  Management per results. - Pathology Report  2. VAIN II (vaginal intraepithelial  neoplasia grade II) Treated in June 2017 with CO2 laser. - Pathology Report   Princess Bruins MD, 4:38 PM 01/09/2018

## 2018-01-10 DIAGNOSIS — N891 Moderate vaginal dysplasia: Secondary | ICD-10-CM | POA: Diagnosis not present

## 2018-01-10 DIAGNOSIS — R87811 Vaginal high risk human papillomavirus (HPV) DNA test positive: Secondary | ICD-10-CM | POA: Diagnosis not present

## 2018-01-10 DIAGNOSIS — R87620 Atypical squamous cells of undetermined significance on cytologic smear of vagina (ASC-US): Secondary | ICD-10-CM | POA: Diagnosis not present

## 2018-01-10 DIAGNOSIS — N890 Mild vaginal dysplasia: Secondary | ICD-10-CM | POA: Diagnosis not present

## 2018-01-14 ENCOUNTER — Encounter: Payer: Self-pay | Admitting: Obstetrics & Gynecology

## 2018-01-14 NOTE — Patient Instructions (Signed)
1. ASCUS with positive high risk human papillomavirus of vagina Vaginal colposcopy compatible with VAIN 1, vaginal biopsy pending.  Findings on colposcopy discussed with patient.  Management per results. - Pathology Report  2. VAIN II (vaginal intraepithelial neoplasia grade II) Treated in June 2017 with CO2 laser. - Pathology Report  Deneise Lever, good seeing you today!  I will inform you of your results as soon as they are available.

## 2018-01-15 LAB — PATHOLOGY

## 2018-01-15 LAB — TISSUE SPECIMEN

## 2018-01-26 ENCOUNTER — Telehealth: Payer: Self-pay | Admitting: Gastroenterology

## 2018-01-29 NOTE — Telephone Encounter (Signed)
Diagnosis of gallstone entered.

## 2018-02-08 ENCOUNTER — Encounter: Payer: Self-pay | Admitting: Gastroenterology

## 2018-08-03 ENCOUNTER — Other Ambulatory Visit: Payer: Self-pay | Admitting: Internal Medicine

## 2018-11-15 ENCOUNTER — Other Ambulatory Visit: Payer: Self-pay | Admitting: Internal Medicine

## 2019-01-23 ENCOUNTER — Other Ambulatory Visit: Payer: Self-pay | Admitting: Neurology

## 2019-02-11 ENCOUNTER — Other Ambulatory Visit: Payer: Self-pay | Admitting: Neurology

## 2019-02-13 ENCOUNTER — Ambulatory Visit (INDEPENDENT_AMBULATORY_CARE_PROVIDER_SITE_OTHER): Payer: Medicare Other | Admitting: Neurology

## 2019-02-13 ENCOUNTER — Other Ambulatory Visit: Payer: Self-pay

## 2019-02-13 ENCOUNTER — Encounter: Payer: Self-pay | Admitting: Neurology

## 2019-02-13 VITALS — BP 155/78 | HR 58 | Ht 66.5 in | Wt 181.0 lb

## 2019-02-13 DIAGNOSIS — G245 Blepharospasm: Secondary | ICD-10-CM

## 2019-02-13 MED ORDER — TRIHEXYPHENIDYL HCL 2 MG PO TABS: 3.0000 mg | ORAL_TABLET | Freq: Three times a day (TID) | ORAL | 4 refills | Status: DC

## 2019-02-13 NOTE — Progress Notes (Signed)
Chief Complaint  Patient presents with  . Follow-up    New room, alone. Pt is doing well on artane and she feels like her tic is stable.  . Neurologic Problem    PCP: [facility]  PATIENT: Laurie Pratt   HISTORY OF PRESENT ILLNESS:Ms. Laurie Pratt, 78 year old female returns for followup. She was last seen 01/30/2014. She has a history of Meige's syndrome presenting primarily with blepharospasm and facial dyskinesias. She has been a patient of [facility] since 2010, previously by Dr. Tyron Russell. She had probable cervical dystonia in the past but that has not been a recent problem. She underwent Botox injections in July 2004 and May 2007 for blepharospasm without significant improvement.  She also takes Artane 2mg  one and 1/2 tab tid, which is beneficial. Her symptoms have been stable since last seen. She denies any side effects to her medication. On exam today she has  frequent pursing and smiling movements of the lips, good voluntary movement in the face tongue and palate. She denies gait difficulties she returns for reevaluation.   UPDATE July 17th 2017:  She continue have frequent forceful eye blinking, mouth purge movement even with Artane 2 mg one and half tablets 3 times a day, she has tried Botox injection many years ago with limited result. After discussed with patient, she is willing to try it again  UPDATE Sept 14th 2017: She came in for EMG guided Xeomin injection, used 50 units, she had almost constant bilateral eyes blinking, mouth perking movement.  UPDATE Dec 14th 2017: She reported limited response to previous injection in Sept 2017, she complains of anxiety,taking Artane 2 mg 3 times a day.  Update August 09, 2017: She complains of allergic reaction was previous xeomin injection, does not want to continue botulism toxin injection anymore, does think Artane 2 mg 1/2 tablet 3 times a day has been helpful, wants the refill of the prescription   UPDATE August 5th 2020: She is overall stable, wants to refill of Artane 2 mg  1.5 tablet 3 times a day   REVIEW OF SYSTEMS: Full 14 system review of systems performed and notable only for those listed, all others are neg:      ALLERGIES: Allergies  Allergen Reactions  . Ace Inhibitors Other (See Comments)    cough  . Doxazosin Mesylate     Unknown reaction  . Verapamil Swelling    REACTION: legs swelling, tired    HOME MEDICATIONS: Outpatient Medications Prior to Visit  Medication Sig Dispense Refill  . atenolol-chlorthalidone (TENORETIC) 100-25 MG tablet TAKE 1 TABLET BY MOUTH EVERY MORNING 90 tablet 3  . Cholecalciferol (VITAMIN D3) 1000 units CAPS Take 1 capsule by mouth every morning.    . latanoprost (XALATAN) 0.005 % ophthalmic solution Place 1 drop into both eyes at bedtime.    Marland Kitchen losartan (COZAAR) 100 MG tablet TAKE 1 TABLET(100 MG) BY MOUTH EVERY MORNING 90 tablet 3  . trihexyphenidyl (ARTANE) 2 MG tablet Take 1.5 tablets (3 mg total) by mouth 3 (three) times daily. 410 tablet 4   No facility-administered medications prior to visit.     PAST MEDICAL HISTORY: Past Medical History:  Diagnosis Date  . Glaucoma   . History of cervical dysplasia    CIN III  w Severe dysplasia  s/p  cervical cone bx 09/ 2014  . History of colon polyps    hyperplastic 08/ 2011  . Hyperlipidemia    diet controlled - no meds  .  Hypertension   . Neuromuscular disorder ([place])   . Osteopenia 12/2017   T score -1.7 FRAX 5.1% / 1.0%  . Tics of organic origin    Dr. Doy Mince  . VAIN II (vaginal intraepithelial neoplasia grade II)   . Wears glasses     PAST SURGICAL HISTORY: Past Surgical History:  Procedure Laterality Date  . ABDOMINAL HYSTERECTOMY N/A 07/22/2014   Procedure: HYSTERECTOMY ABDOMINAL;  Surgeon: Terrance Mass, MD;  Location: [facility];  Service: Gynecology;  Laterality: N/A;  . CATARACT EXTRACTION W/ INTRAOCULAR LENS IMPLANT Left 2014  . CERVICAL CONIZATION W/BX N/A  03/12/2013   Procedure: COLD KNIFE CONIZATION CERVIX WITH BIOPSY/EXAM UNDER ANESTHESIA, RECTAL EXAM;  Surgeon: Alvino Chapel, MD;  Location: [facility];  Service: Gynecology;  Laterality: N/A;  . CERVICAL CONIZATION W/BX N/A 03/12/2013   Procedure: CONIZATION CERVIX WITH BIOPSY;  Surgeon: Alvino Chapel, MD;  Location: [facility];  Service: Gynecology;  Laterality: N/A;  patient returned to OR for fulgeration to stop bleeding  . CO2 LASER APPLICATION N/A 1/60/7371   Procedure: CO2 LASER OF THE VAGINA;  Surgeon: [name], MD;  Location: [facility];  Service: Gynecology;  Laterality: N/A;  . COLONOSCOPY  02-23-2010  . DILATION AND CURETTAGE OF UTERUS    . SALPINGOOPHORECTOMY Bilateral 07/22/2014   Procedure: BILATERAL SALPINGO OOPHORECTOMY;  Surgeon: Terrance Mass, MD;  Location: [facility];  Service: Gynecology;  Laterality: Bilateral;  . TUBAL LIGATION      FAMILY HISTORY: Family History  Problem Relation Age of Onset  . Hypertension Mother   . Diabetes Father   . Hypertension Father   . Heart disease Maternal Aunt        CHF  . Ovarian cancer Sister   . Colon cancer Neg Hx   . Esophageal cancer Neg Hx   . Pancreatic cancer Neg Hx   . Stomach cancer Neg Hx     SOCIAL HISTORY: Social History   Socioeconomic History  . Marital status: Widowed    Spouse name: Not on file  . Number of children: 2  . Years of education: 79  . Highest education level: Not on file  Occupational History  . Occupation: retired   Scientific laboratory technician  . Financial resource strain: Not on file  . Food insecurity    Worry: Not on file    Inability: Not on file  . Transportation needs    Medical: Not on file    Non-medical: Not on file  Tobacco Use  . Smoking status: Never Smoker  . Smokeless tobacco: Never Used  Substance and Sexual Activity  . Alcohol use: Yes    Alcohol/week: 0.0 standard drinks    Comment: rare; 1 beer per year  . Drug  use: No  . Sexual activity: Not Currently    Comment: 1st intercourse- 19, partners- 2, widow  Lifestyle  . Physical activity    Days per week: Not on file    Minutes per session: Not on file  . Stress: Not on file  Relationships  . Social Herbalist on phone: Not on file    Gets together: Not on file    Attends religious service: Not on file    Active member of club or organization: Not on file    Attends meetings of clubs or organizations: Not on file    Relationship status: Not on file  . Intimate partner violence    Fear of current  or ex partner: Not on file    Emotionally abused: Not on file    Physically abused: Not on file    Forced sexual activity: Not on file  Other Topics Concern  . Not on file  Social History Narrative   Patient is widowed with 2 children   Patient is right handed   Patient has a high school education   Patient drinks 1 cup daily     PHYSICAL EXAM  Vitals:   02/13/19 0833  BP: (!) 155/78  Pulse: (!) 58  Weight: 181 lb (82.1 kg)  Height: 5' 6.5" (1.689 m)   Body mass index is 28.78 kg/m. General: well developed, well nourished, seated, in no evident distress Head: head normocephalic and atraumatic. Oropharynx benign. Neck: supple with no carotid bruits. Neurologic Exam  Mental Status: Awake and fully alert. Oriented to place and time. Follows all commands. Mood and affect appropriate. Cranial Nerves: Pupils equal, briskly reactive to light. Extraocular movements full without nystagmus. Visual fields full to confrontation. Hearing intact and symmetric to finger snap. Facial sensation intact. Face, tongue, palate move normally and symmetrically. Neck flexion and extension normal. Infrequent pursing and smiling movements of the lips. Rare blepharospasm noted.  Motor: Normal bulk and tone. Normal strength in all tested extremity muscles. Sensory: intact to light touch. Coordination: Rapid alternating movements normal in  all extremities. Finger-to-nose and heel-to-shin performed accurately bilaterally. Gait and Station: Arises from chair without difficulty. Stance is normal. Tandem gait is normal Reflexes: 1+ and symmetric. Toes downgoing.   DIAGNOSTIC DATA (LABS, IMAGING, TESTING) - I reviewed patient records, labs, notes, testing and imaging myself where available.  Lab Results  Component Value Date   WBC 4.4 01/02/2018   HGB 13.6 01/02/2018   HCT 43.0 01/02/2018   MCV 88.8 01/02/2018   PLT 279 01/02/2018      Component Value Date/Time   NA 141 01/02/2018 1155   K 3.5 01/02/2018 1155   CL 105 01/02/2018 1155   CO2 27 01/02/2018 1155   GLUCOSE 101 (H) 01/02/2018 1155   BUN 17 01/02/2018 1155   CREATININE 1.39 (H) 01/02/2018 1155   CALCIUM 9.7 01/02/2018 1155   PROT 7.5 01/02/2018 1155   ALBUMIN 3.5 01/02/2018 1155   AST 20 01/02/2018 1155   ALT 16 01/02/2018 1155   ALKPHOS 83 01/02/2018 1155   BILITOT 0.7 01/02/2018 1155   GFRNONAA 36 (L) 01/02/2018 1155   GFRAA 42 (L) 01/02/2018 1155    ASSESSMENT AND PLAN  78 y.o. year old female  Blepharospasm   She reported allergic reaction to xeomin injection, face swells up,  I refilled her Artane 2 mg 1 and 1/2 tablets 3 times a day  She may continue to refill prescription through her primary care physician  Marcial Pacas, M.D. Ph.D.  [facility] [place], [place] [address] Phone: [phone] Fax:      [phone]

## 2019-04-18 ENCOUNTER — Encounter: Payer: Self-pay | Admitting: Gynecology

## 2019-06-21 IMAGING — CT CT ABD-PELV W/ CM
2 of 5 series · 16 of 46 positions shown, 18 images · IV contrast (APPLIED)
Comparison: 12/25/2017

CLINICAL DATA: Abdominal pain with mild nausea

EXAM:
CT ABDOMEN AND PELVIS WITH CONTRAST
TECHNIQUE: Multidetector CT imaging of the abdomen and pelvis was performed
using the standard protocol following bolus administration of
intravenous contrast.
CONTRAST:  100mL OMNIPAQUE IOHEXOL 300 MG/ML  SOLN

[Series 3: abd/ pelvis 5.0 i30f 2 · axial · 0.87mm/px · z∈[+767,+1172]mm · 13 of 91 slices shown, 15 images]
[im 5/91  soft-tissue]
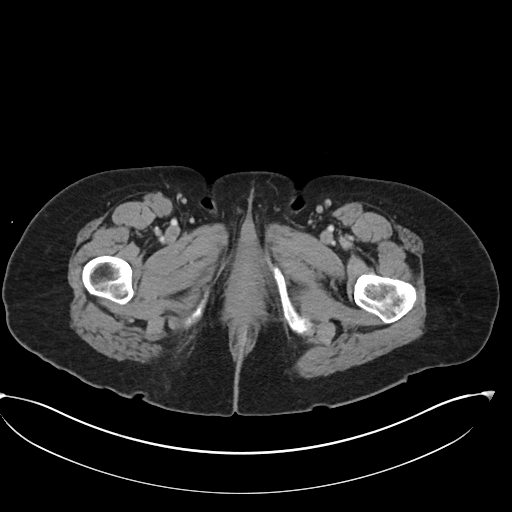
[im 5/91  bone]
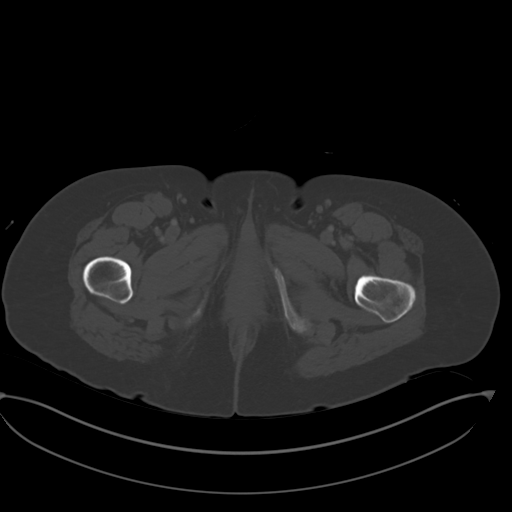
[im 14/91  soft-tissue]
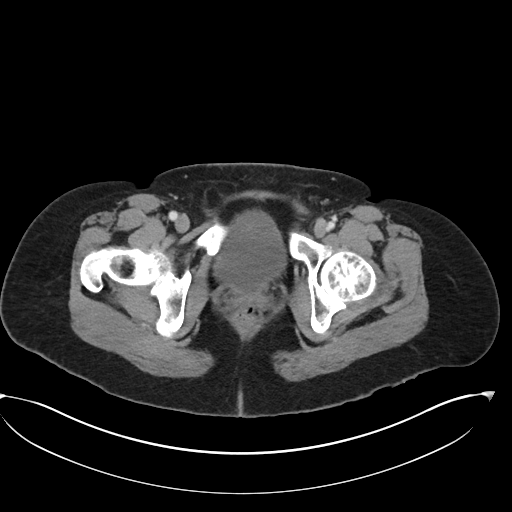
[im 19/91  soft-tissue]
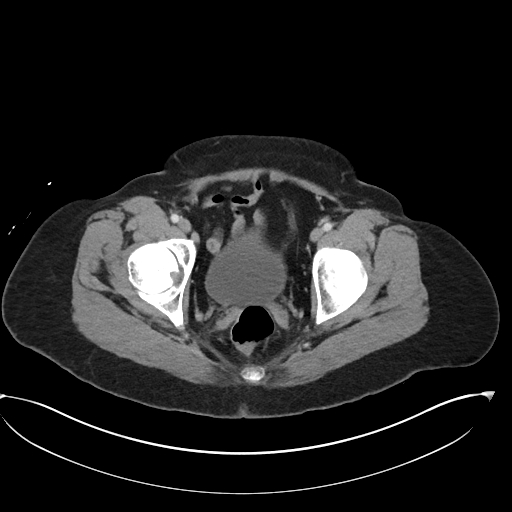
[im 28/91  soft-tissue]
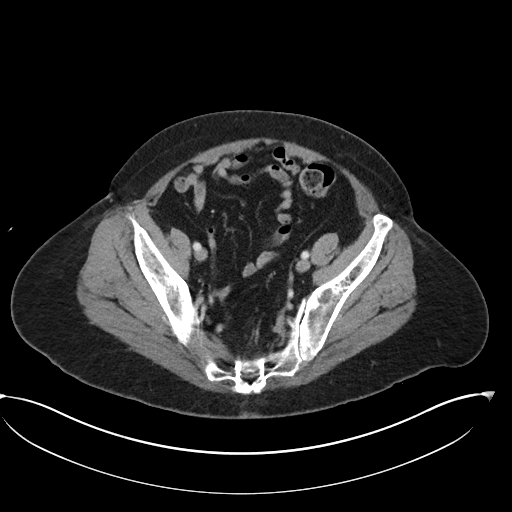
[im 32/91  soft-tissue]
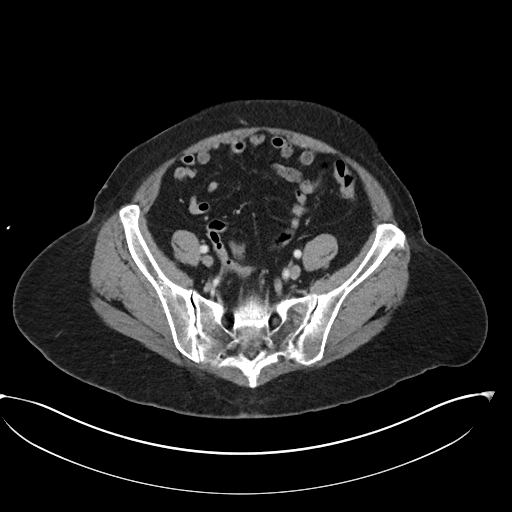
[im 41/91  soft-tissue]
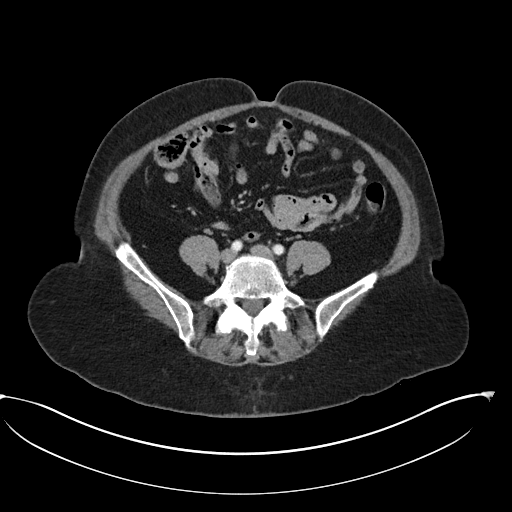
[im 46/91  soft-tissue]
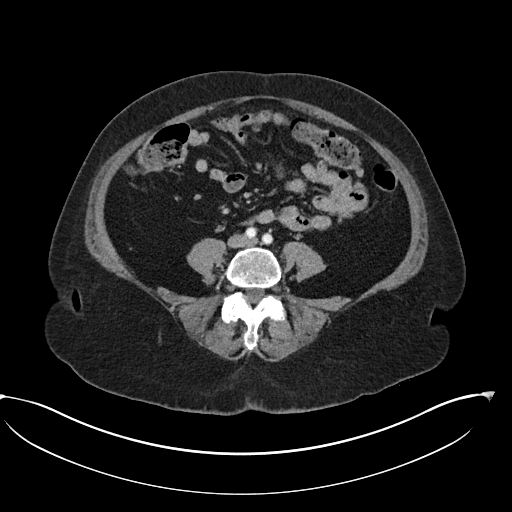
[im 50/91  soft-tissue]
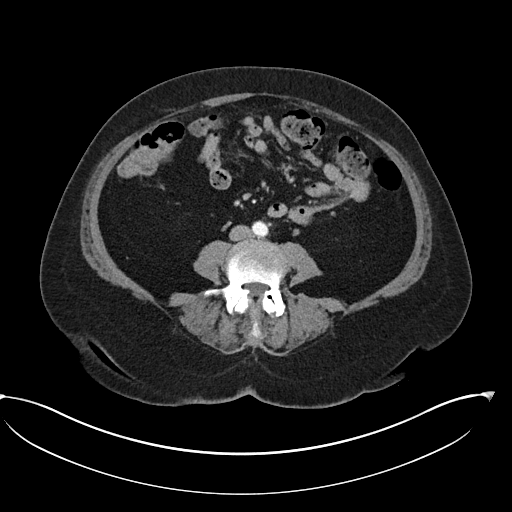
[im 59/91  soft-tissue]
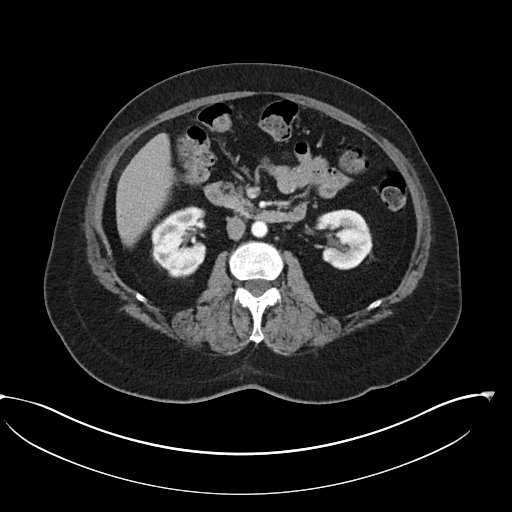
[im 59/91  bone]
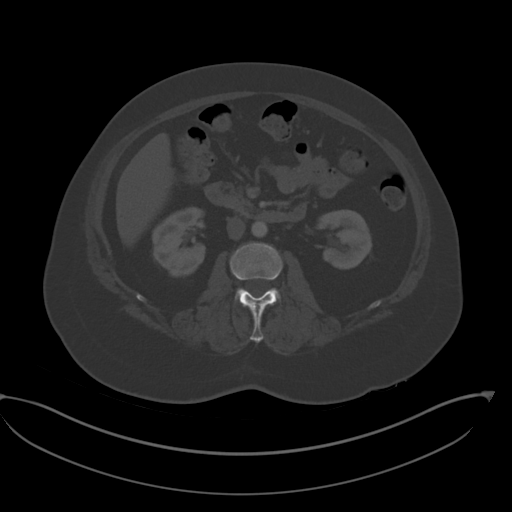
[im 64/91  soft-tissue]
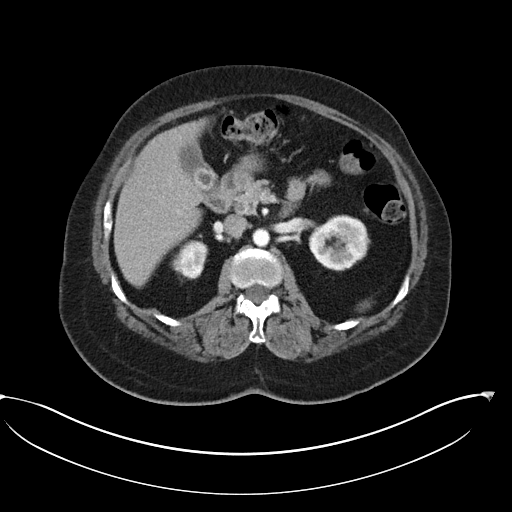
[im 73/91  soft-tissue]
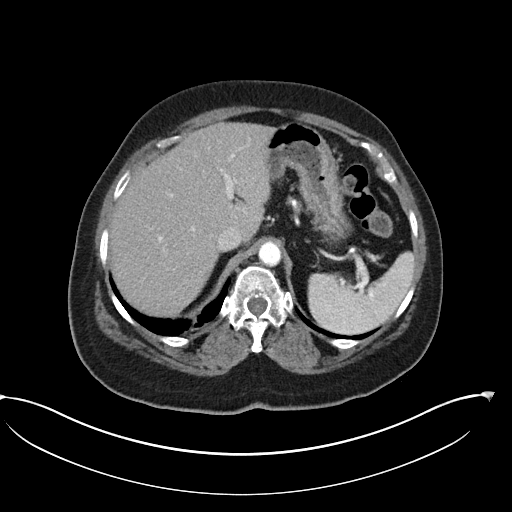
[im 77/91  soft-tissue]
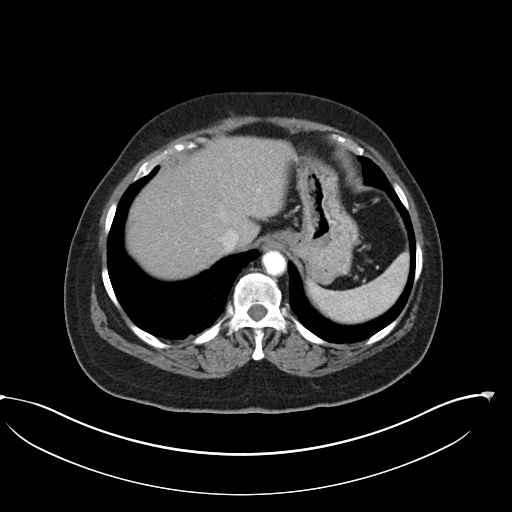
[im 86/91  soft-tissue]
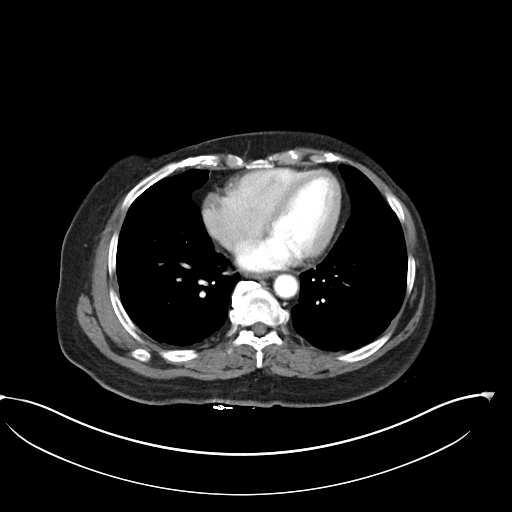

[Series 6: coronal soft tissue · coronal · 0.84mm/px · 3 of 113 slices shown]
[im 38/113  soft-tissue]
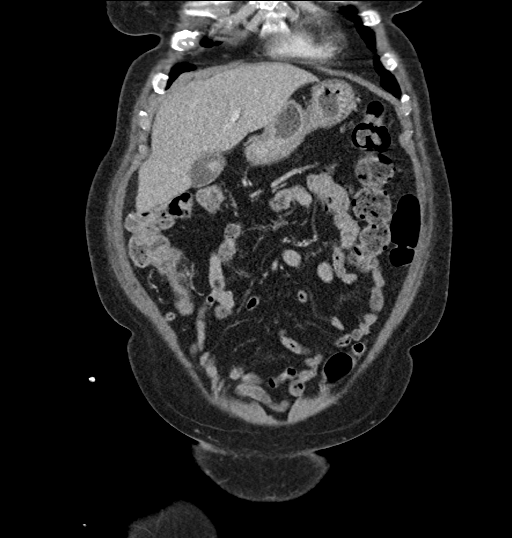
[im 50/113  soft-tissue]
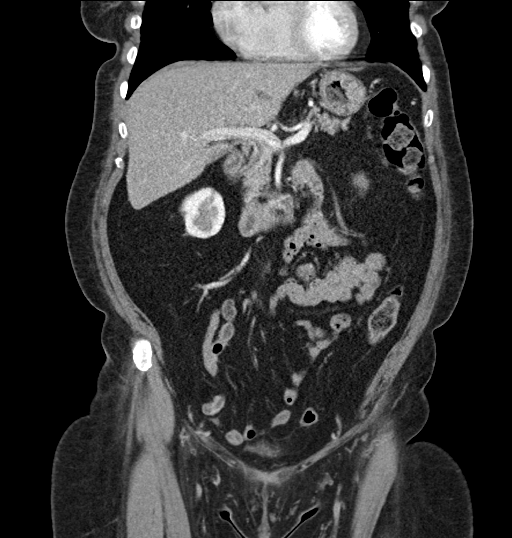
[im 63/113  soft-tissue]
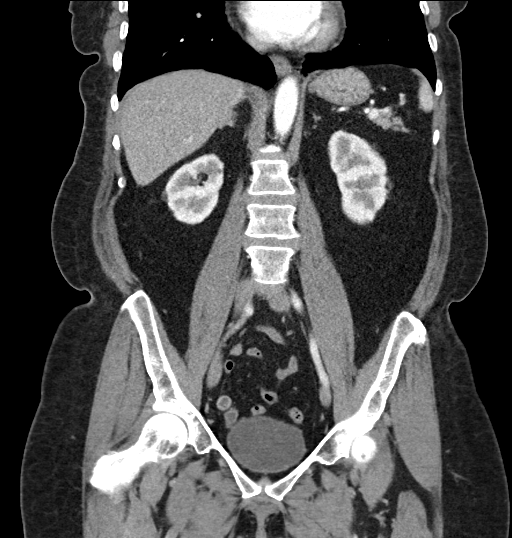

[16 of 46 positions shown; findings below may reference images not displayed]

FINDINGS: Lower chest: Minor dependent bibasilar atelectasis. No pericardial
or pleural effusion. Normal heart size. No hiatal hernia.
Degenerative changes of the thoracic spine with large osteophytes on
the right.

Hepatobiliary: No focal hepatic abnormality. No biliary dilatation.
Hepatic and portal veins are patent.

Cholelithiasis noted, gallstone measures 1.9 cm. No gallbladder
distention or surrounding inflammatory process. Common bile duct not
dilated.

Pancreas: Unremarkable. No pancreatic ductal dilatation or
surrounding inflammatory changes.

Spleen: Normal in size without focal abnormality.

Adrenals/Urinary Tract: 12 mm left adrenal nodule, image 22 series 3
suspect small adenoma. Right adrenal gland appears normal. Kidneys
demonstrate mild diffuse cortical scarring/atrophy. Small
subcentimeter right renal cyst in the midpole laterally. No renal
obstruction or hydronephrosis. Ureters are symmetric and
decompressed. No obstructing urinary tract or ureteral calculus.
Bladder unremarkable.

Stomach/Bowel: Negative for bowel obstruction, significant
dilatation, ileus, or free air. Normal appendix demonstrated. Distal
colon collapsed. No acute inflammatory process, fluid collection, or
abscess.

Vascular/Lymphatic: Negative for aneurysm. No aortoiliac occlusive
process. Mesenteric and renal vasculature appear patent. Pelvic
iliac vessels are patent. No adenopathy.

Reproductive: Remote hysterectomy. No pelvic or adnexal mass. No
free fluid.

Other: No abdominal wall hernia or abnormality. No abdominopelvic
ascites.

Musculoskeletal: Minor degenerative changes of the lower lumbar
spine SI joints. Lower lumbar facet arthropathy noted. No acute
osseous finding.
IMPRESSION: Cholelithiasis

Suspect small left adrenal adenoma

Normal appendix

Remote hysterectomy

No acute intra-abdominal or pelvic finding by CT.

## 2019-08-28 ENCOUNTER — Other Ambulatory Visit: Payer: Self-pay | Admitting: Internal Medicine

## 2019-09-03 ENCOUNTER — Telehealth: Payer: Self-pay | Admitting: Neurology

## 2019-09-03 NOTE — Telephone Encounter (Signed)
I returned the call to the patient. She is going to contact her PCP office and get the covering provider to refill her blood pressure medication.

## 2019-09-03 NOTE — Telephone Encounter (Signed)
Pt states her primary(which is who prescribes her blood pressure medication ) is on vacation.  Pt states she is out of atenolol-chlorthalidone (TENORETIC) 100-25 MG tablet, [facility] [phone] she would like to know if Dr Krista Blue will fill it for her one time since she is out

## 2019-09-04 ENCOUNTER — Telehealth: Payer: Self-pay

## 2019-09-04 MED ORDER — ATENOLOL-CHLORTHALIDONE 100-25 MG PO TABS: 1.0000 | ORAL_TABLET | Freq: Every morning | ORAL | 0 refills | Status: DC

## 2019-09-04 MED ORDER — LOSARTAN POTASSIUM 100 MG PO TABS: ORAL_TABLET | ORAL | 0 refills | Status: DC

## 2019-09-04 NOTE — Telephone Encounter (Signed)
RX sent

## 2019-09-04 NOTE — Telephone Encounter (Signed)
Medication Requested:   losartan (COZAAR) 100 MG tablet  atenolol-chlorthalidone (TENORETIC) 100-25 MG tablet Is medication on med list:  (if no, inform pt they may need an appointment)  Is medication a controled: (yes = last OV with PCP)  -Controlled Substances: Adderall, Ritalin, oxycodone, hydrocodone, methadone, alprazolam, etc  Last visit with PCP: 1.25.2019   Is the OV > than 4 months: (yes = schedule an appt if one is not already made)  Pharmacy (Name, [place]): [facility] on [address] / [address]

## 2019-09-16 MED ORDER — LOSARTAN POTASSIUM 100 MG PO TABS: ORAL_TABLET | ORAL | 0 refills | Status: DC

## 2019-09-16 MED ORDER — ATENOLOL-CHLORTHALIDONE 100-25 MG PO TABS: 1.0000 | ORAL_TABLET | Freq: Every morning | ORAL | 0 refills | Status: DC

## 2019-09-16 NOTE — Addendum Note (Signed)
Addended by: Karren Cobble on: 09/16/2019 11:55 AM   Modules accepted: Orders

## 2019-09-16 NOTE — Telephone Encounter (Signed)
New Message:   Please re-send medication refills to [id number] - [place], [place] AT [address]. 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

## 2019-09-16 NOTE — Telephone Encounter (Signed)
RXs resent to [facility]

## 2019-09-23 ENCOUNTER — Other Ambulatory Visit: Payer: Self-pay

## 2019-09-23 ENCOUNTER — Ambulatory Visit (INDEPENDENT_AMBULATORY_CARE_PROVIDER_SITE_OTHER): Payer: Medicare Other | Admitting: Internal Medicine

## 2019-09-23 ENCOUNTER — Encounter: Payer: Self-pay | Admitting: Internal Medicine

## 2019-09-23 DIAGNOSIS — E785 Hyperlipidemia, unspecified: Secondary | ICD-10-CM | POA: Diagnosis not present

## 2019-09-23 DIAGNOSIS — I10 Essential (primary) hypertension: Secondary | ICD-10-CM

## 2019-09-23 DIAGNOSIS — G245 Blepharospasm: Secondary | ICD-10-CM

## 2019-09-23 DIAGNOSIS — F959 Tic disorder, unspecified: Secondary | ICD-10-CM | POA: Diagnosis not present

## 2019-09-23 MED ORDER — ATENOLOL-CHLORTHALIDONE 100-25 MG PO TABS: 1.0000 | ORAL_TABLET | Freq: Every morning | ORAL | 3 refills | Status: DC

## 2019-09-23 MED ORDER — LOSARTAN POTASSIUM 100 MG PO TABS: ORAL_TABLET | ORAL | 3 refills | Status: DC

## 2019-09-23 NOTE — Patient Instructions (Signed)

## 2019-09-23 NOTE — Progress Notes (Signed)
Subjective:  Patient ID: Laurie Pratt, female    DOB: July 30, 1940  Age: 79 y.o. MRN: [id number]  CC: No chief complaint on file.   HPI Laurie Pratt presents for HTN, tics  C/o blood in the stool x 3-4 times x 2 wks  Ran out of BP meds 10 d ago  Outpatient Medications Prior to Visit  Medication Sig Dispense Refill  . atenolol-chlorthalidone (TENORETIC) 100-25 MG tablet Take 1 tablet by mouth every morning. 30 tablet 0  . Cholecalciferol (VITAMIN D3) 1000 units CAPS Take 1 capsule by mouth every morning.    . latanoprost (XALATAN) 0.005 % ophthalmic solution Place 1 drop into both eyes at bedtime.    Marland Kitchen losartan (COZAAR) 100 MG tablet TAKE 1 TABLET(100 MG) BY MOUTH EVERY MORNING 30 tablet 0  . trihexyphenidyl (ARTANE) 2 MG tablet Take 1.5 tablets (3 mg total) by mouth 3 (three) times daily. 410 tablet 4   No facility-administered medications prior to visit.    ROS: Review of Systems  Constitutional: Negative for activity change, appetite change, chills, fatigue and unexpected weight change.  HENT: Negative for congestion, mouth sores and sinus pressure.   Eyes: Negative for visual disturbance.  Respiratory: Negative for cough and chest tightness.   Gastrointestinal: Positive for anal bleeding. Negative for abdominal pain, nausea and rectal pain.  Genitourinary: Negative for difficulty urinating, frequency and vaginal pain.  Musculoskeletal: Negative for back pain and gait problem.  Skin: Negative for pallor and rash.  Neurological: Positive for facial asymmetry. Negative for dizziness, tremors, seizures, weakness, numbness and headaches.  Psychiatric/Behavioral: Negative for confusion, sleep disturbance and suicidal ideas.    Objective:  BP (!) 166/82 (BP Location: Left Arm, Patient Position: Sitting, Cuff Size: Normal)   Pulse 65   Temp 98.5 F (36.9 C) (Oral)   Ht 5' 6.5" (1.689 m)   Wt 173 lb (78.5 kg)   SpO2 94%   BMI 27.50 kg/m   BP Readings from Last 3  Encounters:  09/23/19 (!) 166/82  02/13/19 (!) 155/78  01/09/18 140/84    Wt Readings from Last 3 Encounters:  09/23/19 173 lb (78.5 kg)  02/13/19 181 lb (82.1 kg)  12/20/17 182 lb 9.6 oz (82.8 kg)    Physical Exam Constitutional:      General: She is not in acute distress.    Appearance: She is well-developed.  HENT:     Head: Normocephalic.     Right Ear: External ear normal.     Left Ear: External ear normal.     Nose: Nose normal.  Eyes:     General:        Right eye: No discharge.        Left eye: No discharge.     Conjunctiva/sclera: Conjunctivae normal.     Pupils: Pupils are equal, round, and reactive to light.  Neck:     Thyroid: No thyromegaly.     Vascular: No JVD.     Trachea: No tracheal deviation.  Cardiovascular:     Rate and Rhythm: Normal rate and regular rhythm.     Heart sounds: Normal heart sounds.  Pulmonary:     Effort: No respiratory distress.     Breath sounds: No stridor. No wheezing.  Abdominal:     General: Bowel sounds are normal. There is no distension.     Palpations: Abdomen is soft. There is no mass.     Tenderness: There is no abdominal tenderness. There is no guarding or rebound.  Musculoskeletal:        General: No tenderness.     Cervical back: Normal range of motion and neck supple.  Lymphadenopathy:     Cervical: No cervical adenopathy.  Skin:    Findings: No erythema or rash.  Neurological:     Mental Status: She is oriented to person, place, and time.     Cranial Nerves: No cranial nerve deficit.     Motor: No abnormal muscle tone.     Coordination: Coordination normal.     Deep Tendon Reflexes: Reflexes normal.  Psychiatric:        Behavior: Behavior normal.        Thought Content: Thought content normal.        Judgment: Judgment normal.    Facial tics  Lab Results  Component Value Date   WBC 4.4 01/02/2018   HGB 13.6 01/02/2018   HCT 43.0 01/02/2018   PLT 279 01/02/2018   GLUCOSE 101 (H) 01/02/2018   CHOL  209 (H) 08/04/2017   TRIG 136.0 08/04/2017   HDL 51.90 08/04/2017   LDLDIRECT 134.0 07/08/2015   LDLCALC 130 (H) 08/04/2017   ALT 16 01/02/2018   AST 20 01/02/2018   NA 141 01/02/2018   K 3.5 01/02/2018   CL 105 01/02/2018   CREATININE 1.39 (H) 01/02/2018   BUN 17 01/02/2018   CO2 27 01/02/2018   TSH 4.08 08/04/2017   INR 1.0 06/09/2014    CT Abdomen Pelvis W Contrast  Result Date: 01/02/2018 CLINICAL DATA:  Abdominal pain with mild nausea EXAM: CT ABDOMEN AND PELVIS WITH CONTRAST TECHNIQUE: Multidetector CT imaging of the abdomen and pelvis was performed using the standard protocol following bolus administration of intravenous contrast. CONTRAST:  132mL OMNIPAQUE IOHEXOL 300 MG/ML  SOLN COMPARISON:  12/25/2017 FINDINGS: Lower chest: Minor dependent bibasilar atelectasis. No pericardial or pleural effusion. Normal heart size. No hiatal hernia. Degenerative changes of the thoracic spine with large osteophytes on the right. Hepatobiliary: No focal hepatic abnormality. No biliary dilatation. Hepatic and portal veins are patent. Cholelithiasis noted, gallstone measures 1.9 cm. No gallbladder distention or surrounding inflammatory process. Common bile duct not dilated. Pancreas: Unremarkable. No pancreatic ductal dilatation or surrounding inflammatory changes. Spleen: Normal in size without focal abnormality. Adrenals/Urinary Tract: 12 mm left adrenal nodule, image 22 series 3 suspect small adenoma. Right adrenal gland appears normal. Kidneys demonstrate mild diffuse cortical scarring/atrophy. Small subcentimeter right renal cyst in the midpole laterally. No renal obstruction or hydronephrosis. Ureters are symmetric and decompressed. No obstructing urinary tract or ureteral calculus. Bladder unremarkable. Stomach/Bowel: Negative for bowel obstruction, significant dilatation, ileus, or free air. Normal appendix demonstrated. Distal colon collapsed. No acute inflammatory process, fluid collection, or  abscess. Vascular/Lymphatic: Negative for aneurysm. No aortoiliac occlusive process. Mesenteric and renal vasculature appear patent. Pelvic iliac vessels are patent. No adenopathy. Reproductive: Remote hysterectomy. No pelvic or adnexal mass. No free fluid. Other: No abdominal wall hernia or abnormality. No abdominopelvic ascites. Musculoskeletal: Minor degenerative changes of the lower lumbar spine SI joints. Lower lumbar facet arthropathy noted. No acute osseous finding. IMPRESSION: Cholelithiasis Suspect small left adrenal adenoma Normal appendix Remote hysterectomy No acute intra-abdominal or pelvic finding by CT. Electronically Signed   By: Jerilynn Mages.  Shick M.D.   On: 01/02/2018 14:56    Assessment & Plan:   There are no diagnoses linked to this encounter.   No orders of the defined types were placed in this encounter.    Follow-up: No follow-ups on file.  Walker Kehr, MD

## 2019-09-23 NOTE — Assessment & Plan Note (Signed)
Artane po

## 2019-09-23 NOTE — Assessment & Plan Note (Signed)
Re-start Losartan, Tenoretic

## 2019-09-23 NOTE — Assessment & Plan Note (Signed)
A cardiac CT scan for calcium scoring offered 

## 2019-09-23 NOTE — Assessment & Plan Note (Signed)
Artane

## 2019-10-15 ENCOUNTER — Other Ambulatory Visit: Payer: Self-pay

## 2019-10-15 ENCOUNTER — Encounter (HOSPITAL_COMMUNITY): Payer: Self-pay

## 2019-10-15 ENCOUNTER — Ambulatory Visit (HOSPITAL_COMMUNITY)
Admission: EM | Admit: 2019-10-15 | Discharge: 2019-10-15 | Disposition: A | Discharge status: Home / Self Care | Payer: Medicare Other | Attending: Family Medicine | Admitting: Family Medicine

## 2019-10-15 DIAGNOSIS — M62838 Other muscle spasm: Secondary | ICD-10-CM

## 2019-10-15 DIAGNOSIS — I10 Essential (primary) hypertension: Secondary | ICD-10-CM

## 2019-10-15 MED ORDER — TIZANIDINE HCL 2 MG PO CAPS: 2.0000 mg | ORAL_CAPSULE | Freq: Two times a day (BID) | ORAL | 0 refills | Status: DC | PRN

## 2019-10-15 MED ORDER — KETOROLAC TROMETHAMINE 30 MG/ML IJ SOLN
30.0000 mg | Freq: Once | INTRAMUSCULAR | Status: AC
  Administered 2019-10-15: 30 mg via INTRAMUSCULAR

## 2019-10-15 MED ORDER — KETOROLAC TROMETHAMINE 30 MG/ML IJ SOLN
INTRAMUSCULAR | Status: AC
  Filled 2019-10-15: qty 1

## 2019-10-15 NOTE — ED Triage Notes (Signed)
Pt reports having neck pain since yesterday. Pt  States is painful to turn her neck to the side.

## 2019-10-15 NOTE — Discharge Instructions (Signed)
Your blood pressure was noted to be elevated during your visit today. If you are currently taking medication for high blood pressure, please ensure you are taking this as directed. If you do not have a history of high blood pressure and your blood pressure remains persistently elevated, you may need to begin taking a medication at some point. You may return here within the next few days to recheck if unable to see your primary care provider or if do not have a one.  BP (!) 161/66 (BP Location: Right Arm)   Pulse 62   Temp 98.2 F (36.8 C) (Oral)   Resp 20   SpO2 100%

## 2019-10-15 NOTE — ED Provider Notes (Signed)
Laurie Pratt   [id number] 10/15/19 Arrival Time: 7829  ASSESSMENT & PLAN:  1. Muscle spasms of neck   2. Elevated blood pressure reading in office with diagnosis of hypertension     No indications for neck imaging.  Meds ordered this encounter  Medications  . ketorolac (TORADOL) 30 MG/ML injection 30 mg  . tizanidine (ZANAFLEX) 2 MG capsule    Sig: Take 1 capsule (2 mg total) by mouth 2 (two) times daily as needed for muscle spasms.    Dispense:  20 capsule    Refill:  0    Encourage movement and neck ROM as she tolerates.   Recommend: Follow-up Information    Plotnikov, Evie Lacks, MD.   Specialty: Internal Medicine Why: If worsening or failing to improve as anticipated. Contact information: [address] [place] [address] [phone]             Discharge Instructions     Your blood pressure was noted to be elevated during your visit today. If you are currently taking medication for high blood pressure, please ensure you are taking this as directed. If you do not have a history of high blood pressure and your blood pressure remains persistently elevated, you may need to begin taking a medication at some point. You may return here within the next few days to recheck if unable to see your primary care provider or if do not have a one.  BP (!) 161/66 (BP Location: Right Arm)   Pulse 62   Temp 98.2 F (36.8 C) (Oral)   Resp 20   SpO2 100%       Reviewed expectations re: course of current medical issues. Questions answered. Outlined signs and symptoms indicating need for more acute intervention. Patient verbalized understanding. After Visit Summary given.  SUBJECTIVE: History from: patient. Laurie Pratt is a 79 y.o. female who reports fairly persistent marked pain of her left posterior neck; described as "stiff like a spasm"; with radiation to left upper back muscles. First noted: this morning. Injury/trama: no. Symptoms have  gradually worsened since beginning. Aggravating factors: certain movements. Alleviating factors: have not been identified. Associated symptoms: none reported. Extremity sensation changes or weakness: none. Self treatment: OTC analgesics without relief.  History of similar: yes, reports h/o muscle spasm of neck with same symptoms.  Past Surgical History:  Procedure Laterality Date  . ABDOMINAL HYSTERECTOMY N/A 07/22/2014   Procedure: HYSTERECTOMY ABDOMINAL;  Surgeon: Terrance Mass, MD;  Location: [facility];  Service: Gynecology;  Laterality: N/A;  . CATARACT EXTRACTION W/ INTRAOCULAR LENS IMPLANT Left 2014  . CERVICAL CONIZATION W/BX N/A 03/12/2013   Procedure: COLD KNIFE CONIZATION CERVIX WITH BIOPSY/EXAM UNDER ANESTHESIA, RECTAL EXAM;  Surgeon: Alvino Chapel, MD;  Location: [facility];  Service: Gynecology;  Laterality: N/A;  . CERVICAL CONIZATION W/BX N/A 03/12/2013   Procedure: CONIZATION CERVIX WITH BIOPSY;  Surgeon: Alvino Chapel, MD;  Location: [facility];  Service: Gynecology;  Laterality: N/A;  patient returned to OR for fulgeration to stop bleeding  . CO2 LASER APPLICATION N/A 2/95/2841   Procedure: CO2 LASER OF THE VAGINA;  Surgeon: [name], MD;  Location: [facility];  Service: Gynecology;  Laterality: N/A;  . COLONOSCOPY  02-23-2010  . DILATION AND CURETTAGE OF UTERUS    . SALPINGOOPHORECTOMY Bilateral 07/22/2014   Procedure: BILATERAL SALPINGO OOPHORECTOMY;  Surgeon: Terrance Mass, MD;  Location: [facility];  Service: Gynecology;  Laterality: Bilateral;  .  TUBAL LIGATION        OBJECTIVE:  Vitals:   10/15/19 1021  BP: (!) 161/66  Pulse: 62  Resp: 20  Temp: 98.2 F (36.8 C)  TempSrc: Oral  SpO2: 100%    General appearance: alert; no distress HEENT: [place]; AT Neck: left sided neck stiffness and TTP; no midline TTP; pain with turning head to left Resp: unlabored respirations Extremities: moves all  normally Skin: warm and dry; no visible rashes Neurologic: gait normal; normal sensation and strength of bilateral UE Psychological: alert and cooperative; normal mood and affect     Allergies  Allergen Reactions  . Ace Inhibitors Other (See Comments)    cough  . Doxazosin Mesylate     Unknown reaction  . Verapamil Swelling    REACTION: legs swelling, tired    Past Medical History:  Diagnosis Date  . Glaucoma   . History of cervical dysplasia    CIN III  w Severe dysplasia  s/p  cervical cone bx 09/ 2014  . History of colon polyps    hyperplastic 08/ 2011  . Hyperlipidemia    diet controlled - no meds  . Hypertension   . Neuromuscular disorder ([place])   . Osteopenia 12/2017   T score -1.7 FRAX 5.1% / 1.0%  . Tics of organic origin    Dr. Doy Mince  . VAIN II (vaginal intraepithelial neoplasia grade II)   . Wears glasses    Social History   Socioeconomic History  . Marital status: Widowed    Spouse name: Not on file  . Number of children: 2  . Years of education: 35  . Highest education level: Not on file  Occupational History  . Occupation: retired   Tobacco Use  . Smoking status: Never Smoker  . Smokeless tobacco: Never Used  Substance and Sexual Activity  . Alcohol use: Yes    Alcohol/week: 0.0 standard drinks    Comment: rare; 1 beer per year  . Drug use: No  . Sexual activity: Not Currently    Comment: 1st intercourse- 19, partners- 2, widow  Other Topics Concern  . Not on file  Social History Narrative   Patient is widowed with 2 children   Patient is right handed   Patient has a high school education   Patient drinks 1 cup daily   Social Determinants of Health   Financial Resource Strain:   . Difficulty of Paying Living Expenses:   Food Insecurity:   . Worried About Charity fundraiser in the Last Year:   . Arboriculturist in the Last Year:   Transportation Needs:   . Film/video editor (Medical):   Marland Kitchen Lack of Transportation  (Non-Medical):   Physical Activity:   . Days of Exercise per Week:   . Minutes of Exercise per Session:   Stress:   . Feeling of Stress :   Social Connections:   . Frequency of Communication with Friends and Family:   . Frequency of Social Gatherings with Friends and Family:   . Attends Religious Services:   . Active Member of Clubs or Organizations:   . Attends Archivist Meetings:   Marland Kitchen Marital Status:    Family History  Problem Relation Age of Onset  . Hypertension Mother   . Diabetes Father   . Hypertension Father   . Heart disease Maternal Aunt        CHF  . Ovarian cancer Sister   . Colon cancer Neg Hx   .  Esophageal cancer Neg Hx   . Pancreatic cancer Neg Hx   . Stomach cancer Neg Hx    Past Surgical History:  Procedure Laterality Date  . ABDOMINAL HYSTERECTOMY N/A 07/22/2014   Procedure: HYSTERECTOMY ABDOMINAL;  Surgeon: Terrance Mass, MD;  Location: [facility];  Service: Gynecology;  Laterality: N/A;  . CATARACT EXTRACTION W/ INTRAOCULAR LENS IMPLANT Left 2014  . CERVICAL CONIZATION W/BX N/A 03/12/2013   Procedure: COLD KNIFE CONIZATION CERVIX WITH BIOPSY/EXAM UNDER ANESTHESIA, RECTAL EXAM;  Surgeon: Alvino Chapel, MD;  Location: [facility];  Service: Gynecology;  Laterality: N/A;  . CERVICAL CONIZATION W/BX N/A 03/12/2013   Procedure: CONIZATION CERVIX WITH BIOPSY;  Surgeon: Alvino Chapel, MD;  Location: [facility];  Service: Gynecology;  Laterality: N/A;  patient returned to OR for fulgeration to stop bleeding  . CO2 LASER APPLICATION N/A 7/84/6962   Procedure: CO2 LASER OF THE VAGINA;  Surgeon: [name], MD;  Location: [facility];  Service: Gynecology;  Laterality: N/A;  . COLONOSCOPY  02-23-2010  . DILATION AND CURETTAGE OF UTERUS    . SALPINGOOPHORECTOMY Bilateral 07/22/2014   Procedure: BILATERAL SALPINGO OOPHORECTOMY;  Surgeon: Terrance Mass, MD;  Location: [facility];  Service: Gynecology;   Laterality: Bilateral;  . TUBAL LIGATION        Vanessa Kick, MD 10/19/19 [phone]

## 2020-03-23 ENCOUNTER — Ambulatory Visit: Payer: Medicare Other | Admitting: Internal Medicine

## 2020-03-23 DIAGNOSIS — Z0289 Encounter for other administrative examinations: Secondary | ICD-10-CM

## 2020-04-04 ENCOUNTER — Other Ambulatory Visit: Payer: Self-pay

## 2020-04-04 ENCOUNTER — Ambulatory Visit (HOSPITAL_COMMUNITY)
Admission: EM | Admit: 2020-04-04 | Discharge: 2020-04-04 | Disposition: A | Discharge status: Home / Self Care | Payer: Medicare Other | Attending: Family Medicine | Admitting: Family Medicine

## 2020-04-04 ENCOUNTER — Encounter (HOSPITAL_COMMUNITY): Payer: Self-pay

## 2020-04-04 DIAGNOSIS — R109 Unspecified abdominal pain: Secondary | ICD-10-CM | POA: Diagnosis not present

## 2020-04-04 DIAGNOSIS — S39012A Strain of muscle, fascia and tendon of lower back, initial encounter: Secondary | ICD-10-CM

## 2020-04-04 LAB — POCT URINALYSIS DIPSTICK, ED / UC
Bilirubin Urine: NEGATIVE
Glucose, UA: NEGATIVE mg/dL
Hgb urine dipstick: NEGATIVE
Nitrite: NEGATIVE
Protein, ur: 30 mg/dL — AB
Specific Gravity, Urine: 1.01 (ref 1.005–1.030)
Urobilinogen, UA: 1 mg/dL (ref 0.0–1.0)
pH: 7 (ref 5.0–8.0)

## 2020-04-04 MED ORDER — KETOROLAC TROMETHAMINE 30 MG/ML IJ SOLN
30.0000 mg | Freq: Once | INTRAMUSCULAR | Status: AC
  Administered 2020-04-04: 30 mg via INTRAMUSCULAR

## 2020-04-04 MED ORDER — KETOROLAC TROMETHAMINE 30 MG/ML IJ SOLN
INTRAMUSCULAR | Status: AC
  Filled 2020-04-04: qty 1

## 2020-04-04 MED ORDER — TIZANIDINE HCL 2 MG PO CAPS: 2.0000 mg | ORAL_CAPSULE | Freq: Two times a day (BID) | ORAL | 0 refills | Status: DC | PRN

## 2020-04-04 NOTE — ED Triage Notes (Signed)
Pt presents today with left sided pain that has been going on since this morning at 2-3 am. States it feels like muscle spasms due to the shooting pain. Does not have any urinary sxs. Does hurt when bending down.

## 2020-04-04 NOTE — ED Provider Notes (Signed)
[place]    CSN: [id number] Arrival date & time: 04/04/20  1350      History   Chief Complaint Chief Complaint  Patient presents with   Flank Pain    HPI Laurie Pratt is a 79 y.o. female.   Here today with sudden onset left flank pain that began around 2 am while she was sleeping in her bed. She describes the pain as sharp and constant and worse with any sort of movement or walking. The pain extends down into lateral hip. Denies radiation down leg, numbness tingling or weakness of leg, known injury, bowel or bladder incontinence, history of back issues, dysuria, hematuria. Has tried ibuprofen so far without any relief.      Past Medical History:  Diagnosis Date   Glaucoma    History of cervical dysplasia    CIN III  w Severe dysplasia  s/p  cervical cone bx 09/ 2014   History of colon polyps    hyperplastic 08/ 2011   Hyperlipidemia    diet controlled - no meds   Hypertension    Neuromuscular disorder ([place])    Osteopenia 12/2017   T score -1.7 FRAX 5.1% / 1.0%   Tics of organic origin    Dr. Doy Mince   VAIN II (vaginal intraepithelial neoplasia grade II)    Wears glasses     Patient Active Problem List   Diagnosis Date Noted   Edema 08/03/2016   Anxiety 06/23/2016   Blepharospasm 03/24/2016   Hemifacial spasm 03/24/2016   VAIN II (vaginal intraepithelial neoplasia grade II) 12/23/2015   Well adult exam 07/08/2015   Preop exam for internal medicine 06/09/2014   CIN III (cervical intraepithelial neoplasia grade III) with severe dysplasia 05/02/2014   History of colonic polyps 04/16/2014   Abnormal involuntary movement 01/30/2014   Severe cervical dysplasia, histologically confirmed 05/03/2013   Cervical cancer ([place]) 02/26/2013   Tic disorder 07/07/2009   EAR PAIN 04/18/2008   FATIGUE 01/15/2008   Dyslipidemia 07/04/2007   Essential hypertension 07/04/2007    Past Surgical History:  Procedure Laterality Date     ABDOMINAL HYSTERECTOMY N/A 07/22/2014   Procedure: HYSTERECTOMY ABDOMINAL;  Surgeon: Terrance Mass, MD;  Location: [facility];  Service: Gynecology;  Laterality: N/A;   CATARACT EXTRACTION W/ INTRAOCULAR LENS IMPLANT Left 2014   CERVICAL CONIZATION W/BX N/A 03/12/2013   Procedure: COLD KNIFE CONIZATION CERVIX WITH BIOPSY/EXAM UNDER ANESTHESIA, RECTAL EXAM;  Surgeon: Alvino Chapel, MD;  Location: [facility];  Service: Gynecology;  Laterality: N/A;   CERVICAL CONIZATION W/BX N/A 03/12/2013   Procedure: CONIZATION CERVIX WITH BIOPSY;  Surgeon: Alvino Chapel, MD;  [facility];  Service: Gynecology;  Laterality: N/A;  patient returned to OR for fulgeration to stop bleeding   CO2 LASER APPLICATION N/A 12/10/930   Procedure: CO2 LASER OF THE VAGINA;  Surgeon: [name], MD;  Location: [facility];  Service: Gynecology;  Laterality: N/A;   COLONOSCOPY  02-23-2010   DILATION AND CURETTAGE OF UTERUS     SALPINGOOPHORECTOMY Bilateral 07/22/2014   Procedure: BILATERAL SALPINGO OOPHORECTOMY;  Surgeon: Terrance Mass, MD;  Location: [facility];  Service: Gynecology;  Laterality: Bilateral;   TUBAL LIGATION      OB History    Gravida  4   Para  3   Term      Preterm      AB  1   Living  3     SAB  1   TAB      Ectopic      Multiple      Live Births               Home Medications    Prior to Admission medications   Medication Sig Start Date End Date Taking? Authorizing Provider  atenolol-chlorthalidone (TENORETIC) 100-25 MG tablet Take 1 tablet by mouth every morning. 09/23/19  Yes Plotnikov, Evie Lacks, MD  Cholecalciferol (VITAMIN D3) 1000 units CAPS Take 1 capsule by mouth every morning.   Yes [provider]  latanoprost (XALATAN) 0.005 % ophthalmic solution Place 1 drop into both eyes at bedtime.   Yes [provider]  losartan (COZAAR) 100 MG tablet TAKE 1 TABLET(100 MG) BY MOUTH  EVERY MORNING 09/23/19  Yes Plotnikov, Evie Lacks, MD  trihexyphenidyl (ARTANE) 2 MG tablet Take 1.5 tablets (3 mg total) by mouth 3 (three) times daily. 02/13/19  Yes Marcial Pacas, MD  tizanidine (ZANAFLEX) 2 MG capsule Take 1 capsule (2 mg total) by mouth 2 (two) times daily as needed for muscle spasms. DO NOT DRIVE OR DRINK ALCOHOL WITH THIS MEDICINE 04/04/20   Volney American, PA-C    Family History Family History  Problem Relation Age of Onset   Hypertension Mother    Diabetes Father    Hypertension Father    Heart disease Maternal Aunt        CHF   Ovarian cancer Sister    Colon cancer Neg Hx    Esophageal cancer Neg Hx    Pancreatic cancer Neg Hx    Stomach cancer Neg Hx     Social History Social History   Tobacco Use   Smoking status: Never Smoker   Smokeless tobacco: Never Used  Vaping Use   Vaping Use: Never used  Substance Use Topics   Alcohol use: Yes    Alcohol/week: 0.0 standard drinks    Comment: rare; 1 beer per year   Drug use: No     Allergies   Ace inhibitors, Doxazosin mesylate, and Verapamil   Review of Systems Review of Systems PER HPI    Physical Exam Triage Vital Signs ED Triage Vitals  Enc Vitals Group     BP 04/04/20 1437 (!) 154/69     Pulse Rate 04/04/20 1437 71     Resp 04/04/20 1437 16     Temp 04/04/20 1437 98.4 F (36.9 C)     Temp Source 04/04/20 1437 Oral     SpO2 04/04/20 1437 99 %     Weight --      Height --      Head Circumference --      Peak Flow --      Pain Score 04/04/20 1442 9     Pain Loc --      Pain Edu? --      Excl. in [place]? --    No data found.  Updated Vital Signs BP (!) 154/69 (BP Location: Right Arm)    Pulse 71    Temp 98.4 F (36.9 C) (Oral)    Resp 16    SpO2 99%   Visual Acuity Right Eye Distance:   Left Eye Distance:   Bilateral Distance:    Right Eye Near:   Left Eye Near:    Bilateral Near:     Physical Exam Vitals and nursing note reviewed.  Constitutional:       Appearance: Normal appearance.     Comments: Appears very uncomfortable  HENT:     Head: Atraumatic.     Mouth/Throat:     Mouth: Mucous membranes are moist.     Pharynx: Oropharynx is clear.  Eyes:     Extraocular Movements: Extraocular movements intact.     Conjunctiva/sclera: Conjunctivae normal.  Cardiovascular:     Rate and Rhythm: Normal rate and regular rhythm.     Pulses: Normal pulses.     Heart sounds: Normal heart sounds.  Pulmonary:     Effort: Pulmonary effort is normal.     Breath sounds: Normal breath sounds.  Abdominal:     General: Bowel sounds are normal. There is no distension.     Palpations: Abdomen is soft.     Tenderness: There is no abdominal tenderness. There is no right CVA tenderness, left CVA tenderness or guarding.  Musculoskeletal:        General: Tenderness (significant ttp left lumbar paraspinal muscles laterally extending down into lateral hip) present.     Cervical back: Normal range of motion and neck supple.     Comments: Antalgic gait - SLR but exacerbation of the localized low back pain with leg extension   Skin:    General: Skin is warm and dry.  Neurological:     Mental Status: She is alert and oriented to person, place, and time.     Sensory: No sensory deficit.     Motor: No weakness.  Psychiatric:        Mood and Affect: Mood normal.        Thought Content: Thought content normal.        Judgment: Judgment normal.      UC Treatments / Results  Labs (all labs ordered are listed, but only abnormal results are displayed) Labs Reviewed  POCT URINALYSIS DIPSTICK, ED / UC - Abnormal; Notable for the following components:      Result Value   Ketones, ur TRACE (*)    Protein, ur 30 (*)    Leukocytes,Ua SMALL (*)    All other components within normal limits    EKG   Radiology No results found.  Procedures Procedures (including critical care time)  Medications Ordered in UC Medications  ketorolac (TORADOL) 30 MG/ML  injection 30 mg (30 mg Intramuscular Given 04/04/20 1514)    Initial Impression / Assessment and Plan / UC Course  I have reviewed the triage vital signs and the nursing notes.  Pertinent labs & imaging results that were available during my care of the patient were reviewed by me and considered in my medical decision making (see chart for details).     Sxs and exam consistent with a lumbar strain, low suspicion for nephrolithiasis but discussed care and return precautions for this as well. U/A today without evidence of this or UTI, and pain exacerbated by movement and is reproducible on palpation. IM toradol given in clinic, discussed zanaflex prn, lidocaine patches, muscle rubs, heat. F/u if sxs worsening or not improving.    Final Clinical Impressions(s) / UC Diagnoses   Final diagnoses:  Strain of lumbar region, initial encounter   Discharge Instructions   None    ED Prescriptions    Medication Sig Dispense Auth. Provider   tizanidine (ZANAFLEX) 2 MG capsule Take 1 capsule (2 mg total) by mouth 2 (two) times daily as needed for muscle spasms. DO NOT DRIVE OR DRINK ALCOHOL WITH THIS MEDICINE 20 capsule Volney American, [place]     PDMP not reviewed this encounter.   Merrie Roof  [place], [place] 04/04/20 1559

## 2020-04-05 ENCOUNTER — Telehealth (HOSPITAL_COMMUNITY): Payer: Self-pay | Admitting: Family Medicine

## 2020-04-05 MED ORDER — CYCLOBENZAPRINE HCL 5 MG PO TABS: 2.5000 mg | ORAL_TABLET | Freq: Three times a day (TID) | ORAL | 0 refills | Status: DC | PRN

## 2020-04-05 NOTE — Telephone Encounter (Signed)
Zanaflex not covered by insurance, will try low dose flexeril instead per patient request due to cost

## 2020-04-08 ENCOUNTER — Ambulatory Visit (INDEPENDENT_AMBULATORY_CARE_PROVIDER_SITE_OTHER): Payer: Medicare Other

## 2020-04-08 ENCOUNTER — Ambulatory Visit (INDEPENDENT_AMBULATORY_CARE_PROVIDER_SITE_OTHER): Payer: Medicare Other | Admitting: Internal Medicine

## 2020-04-08 ENCOUNTER — Encounter: Payer: Self-pay | Admitting: Internal Medicine

## 2020-04-08 ENCOUNTER — Other Ambulatory Visit: Payer: Self-pay

## 2020-04-08 VITALS — BP 102/54 | HR 72 | Temp 99.7°F | Ht 66.5 in | Wt 159.0 lb

## 2020-04-08 DIAGNOSIS — M545 Low back pain, unspecified: Secondary | ICD-10-CM

## 2020-04-08 MED ORDER — METHYLPREDNISOLONE 4 MG PO TBPK: ORAL_TABLET | ORAL | 0 refills | Status: DC

## 2020-04-08 MED ORDER — HYDROCODONE-ACETAMINOPHEN 5-325 MG PO TABS: 0.5000 | ORAL_TABLET | Freq: Four times a day (QID) | ORAL | 0 refills | Status: DC | PRN

## 2020-04-08 MED ORDER — METHYLPREDNISOLONE ACETATE 80 MG/ML IJ SUSP
80.0000 mg | Freq: Once | INTRAMUSCULAR | Status: AC
  Administered 2020-04-08: 80 mg via INTRAMUSCULAR

## 2020-04-08 NOTE — Progress Notes (Signed)
Subjective:  Patient ID: Laurie Pratt, female    DOB: Jul 03, 1941  Age: 79 y.o. MRN: [id number]  CC: No chief complaint on file.   HPI Laurie Pratt presents for L side pain since Sat - severe, constant. Worse w/touch, moving Pt went to UC on 9/25 - dx LBP, given Tizanidine - no help. UA was ok  Mot better  Outpatient Medications Prior to Visit  Medication Sig Dispense Refill  . atenolol-chlorthalidone (TENORETIC) 100-25 MG tablet Take 1 tablet by mouth every morning. 90 tablet 3  . Cholecalciferol (VITAMIN D3) 1000 units CAPS Take 1 capsule by mouth every morning.    . cyclobenzaprine (FLEXERIL) 5 MG tablet Take 0.5-1 tablets (2.5-5 mg total) by mouth 3 (three) times daily as needed for muscle spasms. DO NOT DRIVE OR DRINK ALCOHOL WHILE TAKING THIS MEDICATION 30 tablet 0  . latanoprost (XALATAN) 0.005 % ophthalmic solution Place 1 drop into both eyes at bedtime.    Marland Kitchen losartan (COZAAR) 100 MG tablet TAKE 1 TABLET(100 MG) BY MOUTH EVERY MORNING 90 tablet 3  . trihexyphenidyl (ARTANE) 2 MG tablet Take 1.5 tablets (3 mg total) by mouth 3 (three) times daily. 410 tablet 4   No facility-administered medications prior to visit.    ROS: Review of Systems  Constitutional: Negative for activity change, appetite change, chills, fatigue and unexpected weight change.  HENT: Negative for congestion, mouth sores and sinus pressure.   Eyes: Negative for visual disturbance.  Respiratory: Negative for cough and chest tightness.   Gastrointestinal: Negative for abdominal pain and nausea.  Genitourinary: Negative for difficulty urinating, frequency and vaginal pain.  Musculoskeletal: Positive for back pain and gait problem.  Skin: Negative for pallor and rash.  Neurological: Negative for dizziness, tremors, weakness, numbness and headaches.  Psychiatric/Behavioral: Negative for confusion and sleep disturbance.    Objective:  BP (!) 102/54 (BP Location: Left Arm, Patient Position:  Sitting, Cuff Size: Large)   Pulse 72   Temp 99.7 F (37.6 C) (Oral)   Ht 5' 6.5" (1.689 m)   Wt 159 lb (72.1 kg)   SpO2 97%   BMI 25.28 kg/m   BP Readings from Last 3 Encounters:  04/08/20 (!) 102/54  04/04/20 (!) 154/69  10/15/19 (!) 161/66    Wt Readings from Last 3 Encounters:  04/08/20 159 lb (72.1 kg)  09/23/19 173 lb (78.5 kg)  02/13/19 181 lb (82.1 kg)    Physical Exam Constitutional:      General: She is not in acute distress.    Appearance: She is well-developed.  HENT:     Head: Normocephalic.     Right Ear: External ear normal.     Left Ear: External ear normal.     Nose: Nose normal.  Eyes:     General:        Right eye: No discharge.        Left eye: No discharge.     Conjunctiva/sclera: Conjunctivae normal.     Pupils: Pupils are equal, round, and reactive to light.  Neck:     Thyroid: No thyromegaly.     Vascular: No JVD.     Trachea: No tracheal deviation.  Cardiovascular:     Rate and Rhythm: Normal rate and regular rhythm.     Heart sounds: Normal heart sounds.  Pulmonary:     Effort: No respiratory distress.     Breath sounds: No stridor. No wheezing.  Abdominal:     General: Bowel sounds are normal. There  is no distension.     Palpations: Abdomen is soft. There is no mass.     Tenderness: There is no abdominal tenderness. There is no guarding or rebound.  Musculoskeletal:        General: Tenderness present.     Cervical back: Normal range of motion and neck supple.  Lymphadenopathy:     Cervical: No cervical adenopathy.  Skin:    Findings: No erythema or rash.  Neurological:     Mental Status: She is oriented to person, place, and time.     Cranial Nerves: No cranial nerve deficit.     Motor: No abnormal muscle tone.     Coordination: Coordination normal.     Gait: Gait abnormal.     Deep Tendon Reflexes: Reflexes normal.  Psychiatric:        Behavior: Behavior normal.        Thought Content: Thought content normal.         Judgment: Judgment normal.    Hips ok Str leg elev (-) B LS tender in the L SI area No rash    A total time of >25 minutes was spent preparing to see the patient, reviewing tests, x-rays, operative reports and outside records.  Also, obtaining history and performing comprehensive physical exam.  Additionally, counseling the patient regarding the above listed issues.   Finally, documenting clinical information in the health records, coordination of care, educating the patient on acute low back pain and sciatica pain.  Steroids were given and effects of steroids on the body were discussed.  Lab Results  Component Value Date   WBC 4.4 01/02/2018   HGB 13.6 01/02/2018   HCT 43.0 01/02/2018   PLT 279 01/02/2018   GLUCOSE 101 (H) 01/02/2018   CHOL 209 (H) 08/04/2017   TRIG 136.0 08/04/2017   HDL 51.90 08/04/2017   LDLDIRECT 134.0 07/08/2015   LDLCALC 130 (H) 08/04/2017   ALT 16 01/02/2018   AST 20 01/02/2018   NA 141 01/02/2018   K 3.5 01/02/2018   CL 105 01/02/2018   CREATININE 1.39 (H) 01/02/2018   BUN 17 01/02/2018   CO2 27 01/02/2018   TSH 4.08 08/04/2017   INR 1.0 06/09/2014    No results found.  Assessment & Plan:   There are no diagnoses linked to this encounter.   No orders of the defined types were placed in this encounter.    Follow-up: No follow-ups on file.  Walker Kehr, MD

## 2020-04-08 NOTE — Patient Instructions (Signed)
Acute Back Pain, Adult Acute back pain is sudden and usually short-lived. It is often caused by an injury to the muscles and tissues in the back. The injury may result from:  A muscle or ligament getting overstretched or torn (strained). Ligaments are tissues that connect bones to each other. Lifting something improperly can cause a back strain.  Wear and tear (degeneration) of the spinal disks. Spinal disks are circular tissue that provides cushioning between the bones of the spine (vertebrae).  Twisting motions, such as while playing sports or doing yard work.  A hit to the back.  Arthritis. You may have a physical exam, lab tests, and imaging tests to find the cause of your pain. Acute back pain usually goes away with rest and home care. Follow these instructions at home: Managing pain, stiffness, and swelling  Take over-the-counter and prescription medicines only as told by your health care provider.  Your health care provider may recommend applying ice during the first 24-48 hours after your pain starts. To do this: ? Put ice in a plastic bag. ? Place a towel between your skin and the bag. ? Leave the ice on for 20 minutes, 2-3 times a day.  If directed, apply heat to the affected area as often as told by your health care provider. Use the heat source that your health care provider recommends, such as a moist heat pack or a heating pad. ? Place a towel between your skin and the heat source. ? Leave the heat on for 20-30 minutes. ? Remove the heat if your skin turns bright red. This is especially important if you are unable to feel pain, heat, or cold. You have a greater risk of getting burned. Activity   Do not stay in bed. Staying in bed for more than 1-2 days can delay your recovery.  Sit up and stand up straight. Avoid leaning forward when you sit, or hunching over when you stand. ? If you work at a desk, sit close to it so you do not need to lean over. Keep your chin tucked  in. Keep your neck drawn back, and keep your elbows bent at a right angle. Your arms should look like the letter "L." ? Sit high and close to the steering wheel when you drive. Add lower back (lumbar) support to your car seat, if needed.  Take short walks on even surfaces as soon as you are able. Try to increase the length of time you walk each day.  Do not sit, drive, or stand in one place for more than 30 minutes at a time. Sitting or standing for long periods of time can put stress on your back.  Do not drive or use heavy machinery while taking prescription pain medicine.  Use proper lifting techniques. When you bend and lift, use positions that put less stress on your back: ? Bend your knees. ? Keep the load close to your body. ? Avoid twisting.  Exercise regularly as told by your health care provider. Exercising helps your back heal faster and helps prevent back injuries by keeping muscles strong and flexible.  Work with a physical therapist to make a safe exercise program, as recommended by your health care provider. Do any exercises as told by your physical therapist. Lifestyle  Maintain a healthy weight. Extra weight puts stress on your back and makes it difficult to have good posture.  Avoid activities or situations that make you feel anxious or stressed. Stress and anxiety increase muscle   tension and can make back pain worse. Learn ways to manage anxiety and stress, such as through exercise. General instructions  Sleep on a firm mattress in a comfortable position. Try lying on your side with your knees slightly bent. If you lie on your back, put a pillow under your knees.  Follow your treatment plan as told by your health care provider. This may include: ? Cognitive or behavioral therapy. ? Acupuncture or massage therapy. ? Meditation or yoga. Contact a health care provider if:  You have pain that is not relieved with rest or medicine.  You have increasing pain going down  into your legs or buttocks.  Your pain does not improve after 2 weeks.  You have pain at night.  You lose weight without trying.  You have a fever or chills. Get help right away if:  You develop new bowel or bladder control problems.  You have unusual weakness or numbness in your arms or legs.  You develop nausea or vomiting.  You develop abdominal pain.  You feel faint. Summary  Acute back pain is sudden and usually short-lived.  Use proper lifting techniques. When you bend and lift, use positions that put less stress on your back.  Take over-the-counter and prescription medicines and apply heat or ice as directed by your health care provider. This information is not intended to replace advice given to you by your health care provider. Make sure you discuss any questions you have with your health care provider. Document Revised: 10/16/2018 Document Reviewed: 02/08/2017 Elsevier Patient Education  2020 [employer]  

## 2020-04-10 ENCOUNTER — Ambulatory Visit: Payer: Medicare Other | Admitting: Family

## 2020-04-25 ENCOUNTER — Other Ambulatory Visit: Payer: Self-pay | Admitting: Neurology

## 2020-07-08 ENCOUNTER — Telehealth: Payer: Self-pay | Admitting: Internal Medicine

## 2020-07-08 NOTE — Telephone Encounter (Signed)
Error

## 2020-07-09 ENCOUNTER — Other Ambulatory Visit: Payer: Self-pay | Admitting: Neurology

## 2020-07-16 ENCOUNTER — Telehealth: Payer: Self-pay | Admitting: Internal Medicine

## 2020-07-16 MED ORDER — TRIHEXYPHENIDYL HCL 2 MG PO TABS: 3.0000 mg | ORAL_TABLET | Freq: Three times a day (TID) | ORAL | 1 refills | Status: DC

## 2020-07-16 NOTE — Telephone Encounter (Signed)
Okay.  Please schedule an appointment every 6 months for follow-up.  Thanks

## 2020-07-16 NOTE — Telephone Encounter (Signed)
trihexyphenidyl (ARTANE) 2 MG tablet [facility] [phone] - [place], [place] AT [address] Phone:  [phone]  Fax:  [phone]     Patient calling requesting a refill for this medication, informed her Dr. Alain Marion was not the one to prescribe her this but she wanted me to ask.  Last seen-09.29.21 Next apt-N/A

## 2020-07-17 NOTE — Telephone Encounter (Signed)
Notified pt w/MD response. Made appt for 11/16/20.Marland KitchenJohny Chess

## 2020-11-06 ENCOUNTER — Other Ambulatory Visit: Payer: Self-pay | Admitting: Internal Medicine

## 2020-11-16 ENCOUNTER — Encounter: Payer: Self-pay | Admitting: Internal Medicine

## 2020-11-16 ENCOUNTER — Ambulatory Visit (INDEPENDENT_AMBULATORY_CARE_PROVIDER_SITE_OTHER): Payer: Medicare Other | Admitting: Internal Medicine

## 2020-11-16 ENCOUNTER — Other Ambulatory Visit: Payer: Self-pay

## 2020-11-16 ENCOUNTER — Ambulatory Visit: Payer: Medicare Other

## 2020-11-16 VITALS — BP 132/70 | HR 61 | Temp 98.8°F | Ht 66.5 in | Wt 167.4 lb

## 2020-11-16 DIAGNOSIS — E785 Hyperlipidemia, unspecified: Secondary | ICD-10-CM | POA: Diagnosis not present

## 2020-11-16 DIAGNOSIS — G245 Blepharospasm: Secondary | ICD-10-CM | POA: Diagnosis not present

## 2020-11-16 DIAGNOSIS — Z0000 Encounter for general adult medical examination without abnormal findings: Secondary | ICD-10-CM

## 2020-11-16 DIAGNOSIS — F959 Tic disorder, unspecified: Secondary | ICD-10-CM

## 2020-11-16 DIAGNOSIS — Z86010 Personal history of colonic polyps: Secondary | ICD-10-CM

## 2020-11-16 DIAGNOSIS — I10 Essential (primary) hypertension: Secondary | ICD-10-CM | POA: Diagnosis not present

## 2020-11-16 MED ORDER — ATENOLOL-CHLORTHALIDONE 100-25 MG PO TABS: 1.0000 | ORAL_TABLET | Freq: Every morning | ORAL | 3 refills | Status: DC

## 2020-11-16 MED ORDER — LOSARTAN POTASSIUM 100 MG PO TABS: ORAL_TABLET | ORAL | 3 refills | Status: DC

## 2020-11-16 NOTE — Assessment & Plan Note (Signed)
A cardiac CT scan for calcium scoring offered before

## 2020-11-16 NOTE — Assessment & Plan Note (Signed)
Cont w/Losartan, Tenoretic

## 2020-11-16 NOTE — Assessment & Plan Note (Signed)
On Artane

## 2020-11-16 NOTE — Progress Notes (Signed)
Subjective:  Patient ID: Laurie Pratt, female    DOB: [birth year]  Age: 80 y.o. MRN: [id number]  CC: Follow-up (6 month f/u)   HPI LIOR CARTELLI presents for HTN, tics, dyslipidemia f/u  Outpatient Medications Prior to Visit  Medication Sig Dispense Refill  . atenolol-chlorthalidone (TENORETIC) 100-25 MG tablet Take 1 tablet by mouth every morning. 90 tablet 3  . Cholecalciferol (VITAMIN D3) 1000 units CAPS Take 1 capsule by mouth every morning.    . cyclobenzaprine (FLEXERIL) 5 MG tablet Take 0.5-1 tablets (2.5-5 mg total) by mouth 3 (three) times daily as needed for muscle spasms. DO NOT DRIVE OR DRINK ALCOHOL WHILE TAKING THIS MEDICATION 30 tablet 0  . HYDROcodone-acetaminophen (NORCO/VICODIN) 5-325 MG tablet Take 0.5-1 tablets by mouth every 6 (six) hours as needed for severe pain. 20 tablet 0  . latanoprost (XALATAN) 0.005 % ophthalmic solution Place 1 drop into both eyes at bedtime.    Marland Kitchen losartan (COZAAR) 100 MG tablet TAKE 1 TABLET(100 MG) BY MOUTH EVERY MORNING appt is due must see provider for refills 30 tablet 0  . trihexyphenidyl (ARTANE) 2 MG tablet Take 1.5 tablets (3 mg total) by mouth 3 (three) times daily. 410 tablet 1  . methylPREDNISolone (MEDROL DOSEPAK) 4 MG TBPK tablet As directed (Patient not taking: Reported on 11/16/2020) 21 tablet 0   No facility-administered medications prior to visit.    ROS: Review of Systems  Constitutional: Positive for unexpected weight change. Negative for activity change, appetite change, chills and fatigue.  HENT: Negative for congestion, mouth sores and sinus pressure.   Eyes: Negative for visual disturbance.  Respiratory: Negative for cough and chest tightness.   Gastrointestinal: Negative for abdominal pain and nausea.  Genitourinary: Negative for difficulty urinating, frequency and vaginal pain.  Musculoskeletal: Negative for back pain and gait problem.  Skin: Negative for pallor and rash.  Neurological: Negative for  dizziness, tremors, weakness, numbness and headaches.  Psychiatric/Behavioral: Negative for confusion and sleep disturbance.    Objective:  BP 132/70 (BP Location: Left Arm)   Pulse 61   Temp 98.8 F (37.1 C) (Oral)   Ht 5' 6.5" (1.689 m)   Wt 167 lb 6.4 oz (75.9 kg)   SpO2 96%   BMI 26.61 kg/m   BP Readings from Last 3 Encounters:  11/16/20 132/70  04/08/20 (!) 102/54  04/04/20 (!) 154/69    Wt Readings from Last 3 Encounters:  11/16/20 167 lb 6.4 oz (75.9 kg)  04/08/20 159 lb (72.1 kg)  09/23/19 173 lb (78.5 kg)    Physical Exam Constitutional:      General: She is not in acute distress.    Appearance: She is well-developed. She is obese.  HENT:     Head: Normocephalic.     Right Ear: External ear normal.     Left Ear: External ear normal.     Nose: Nose normal.  Eyes:     General:        Right eye: No discharge.        Left eye: No discharge.     Conjunctiva/sclera: Conjunctivae normal.     Pupils: Pupils are equal, round, and reactive to light.  Neck:     Thyroid: No thyromegaly.     Vascular: No JVD.     Trachea: No tracheal deviation.  Cardiovascular:     Rate and Rhythm: Normal rate and regular rhythm.     Heart sounds: Normal heart sounds.  Pulmonary:     Effort:  No respiratory distress.     Breath sounds: No stridor. No wheezing.  Abdominal:     General: Bowel sounds are normal. There is no distension.     Palpations: Abdomen is soft. There is no mass.     Tenderness: There is no abdominal tenderness. There is no guarding or rebound.  Musculoskeletal:        General: No tenderness.     Cervical back: Normal range of motion and neck supple.  Lymphadenopathy:     Cervical: No cervical adenopathy.  Skin:    Findings: No erythema or rash.  Neurological:     Cranial Nerves: No cranial nerve deficit.     Motor: No abnormal muscle tone.     Coordination: Coordination normal.     Deep Tendon Reflexes: Reflexes normal.  Psychiatric:         Behavior: Behavior normal.        Thought Content: Thought content normal.        Judgment: Judgment normal.    Facial tics   Lab Results  Component Value Date   WBC 4.4 01/02/2018   HGB 13.6 01/02/2018   HCT 43.0 01/02/2018   PLT 279 01/02/2018   GLUCOSE 101 (H) 01/02/2018   CHOL 209 (H) 08/04/2017   TRIG 136.0 08/04/2017   HDL 51.90 08/04/2017   LDLDIRECT 134.0 07/08/2015   LDLCALC 130 (H) 08/04/2017   ALT 16 01/02/2018   AST 20 01/02/2018   NA 141 01/02/2018   K 3.5 01/02/2018   CL 105 01/02/2018   CREATININE 1.39 (H) 01/02/2018   BUN 17 01/02/2018   CO2 27 01/02/2018   TSH 4.08 08/04/2017   INR 1.0 06/09/2014    No results found.  Assessment & Plan:   There are no diagnoses linked to this encounter.   No orders of the defined types were placed in this encounter.    Follow-up: No follow-ups on file.  Walker Kehr, MD

## 2020-11-16 NOTE — Assessment & Plan Note (Signed)
Will ref to GI - due colonoscopy. Last colon 2011

## 2020-11-30 ENCOUNTER — Ambulatory Visit: Payer: Medicare Other

## 2020-12-14 ENCOUNTER — Other Ambulatory Visit: Payer: Self-pay

## 2020-12-14 ENCOUNTER — Ambulatory Visit (INDEPENDENT_AMBULATORY_CARE_PROVIDER_SITE_OTHER): Payer: Medicare Other

## 2020-12-14 VITALS — BP 128/60 | HR 65 | Temp 98.2°F | Resp 16 | Ht 67.0 in | Wt 166.6 lb

## 2020-12-14 DIAGNOSIS — Z0000 Encounter for general adult medical examination without abnormal findings: Secondary | ICD-10-CM | POA: Diagnosis not present

## 2020-12-14 NOTE — Progress Notes (Addendum)
Subjective:   Laurie Pratt is a 80 y.o. female who presents for Medicare Annual (Subsequent) preventive examination.  Review of Systems    No ROS. Medicare Wellness Visit. Additional risk factors are reflected in social history. Cardiac Risk Factors include: advanced age (>72men, >26 women);dyslipidemia;family history of premature cardiovascular disease;hypertension Sleep Patterns: No sleep issues, feels rested on waking and sleeps 8 hours nightly. Home Safety/Smoke Alarms: Feels safe in home; uses home alarm. Smoke alarms in place. Living environment: 1-story home; Widowed and lives alone; no needs for DME; good support system. Seat Belt Safety/Bike Helmet: Wears seat belt.    Objective:    Today's Vitals   12/14/20 1225 12/14/20 1228  BP: 128/60   Pulse: 65   Resp: 16   Temp: 98.2 F (36.8 C)   SpO2: 98%   Weight: 166 lb 9.6 oz (75.6 kg)   Height: 5\' 7"  (1.702 m)   PainSc: 0-No pain 0-No pain   Body mass index is 26.09 kg/m.  Advanced Directives 12/14/2020 12/31/2015 07/22/2014 07/18/2014 03/12/2013 03/07/2013  Does Patient Have a Medical Advance Directive? No Yes No No Patient has advance directive, copy not in chart Patient has advance directive, copy not in chart  Type of Advance Directive - Living will - - Living will Living will  Does patient want to make changes to medical advance directive? No - Patient declined No - Patient declined - - - -  Copy of [place] in Chart? - - - - Copy requested from family Copy requested from family  Would patient like information on creating a medical advance directive? - - No - patient declined information No - patient declined information - -  Pre-existing out of facility DNR order (yellow form or pink MOST form) - - - - No No    Current Medications (verified) Outpatient Encounter Medications as of 12/14/2020  Medication Sig   atenolol-chlorthalidone (TENORETIC) 100-25 MG tablet Take 1 tablet by mouth every  morning.   Cholecalciferol (VITAMIN D3) 1000 units CAPS Take 1 capsule by mouth every morning.   latanoprost (XALATAN) 0.005 % ophthalmic solution Place 1 drop into both eyes at bedtime.   losartan (COZAAR) 100 MG tablet TAKE 1 TABLET(100 MG) BY MOUTH EVERY MORNING appt is due must see provider for refills   trihexyphenidyl (ARTANE) 2 MG tablet Take 1.5 tablets (3 mg total) by mouth 3 (three) times daily.   No facility-administered encounter medications on file as of 12/14/2020.    Allergies (verified) Ace inhibitors, Doxazosin mesylate, and Verapamil   History: Past Medical History:  Diagnosis Date   Glaucoma    History of cervical dysplasia    CIN III  w Severe dysplasia  s/p  cervical cone bx 09/ 2014   History of colon polyps    hyperplastic 08/ 2011   Hyperlipidemia    diet controlled - no meds   Hypertension    Neuromuscular disorder ([place])    Osteopenia 12/2017   T score -1.7 FRAX 5.1% / 1.0%   Tics of organic origin    Dr. Doy Mince   VAIN II (vaginal intraepithelial neoplasia grade II)    Wears glasses    Past Surgical History:  Procedure Laterality Date   ABDOMINAL HYSTERECTOMY N/A 07/22/2014   Procedure: HYSTERECTOMY ABDOMINAL;  Surgeon: Terrance Mass, MD;  Location: [facility];  Service: Gynecology;  Laterality: N/A;   CATARACT EXTRACTION W/ INTRAOCULAR LENS IMPLANT Left 2014   CERVICAL CONIZATION W/BX N/A 03/12/2013  Procedure: COLD KNIFE CONIZATION CERVIX WITH BIOPSY/EXAM UNDER ANESTHESIA, RECTAL EXAM;  Surgeon: Alvino Chapel, MD;  Location: [facility];  Service: Gynecology;  Laterality: N/A;   CERVICAL CONIZATION W/BX N/A 03/12/2013   Procedure: CONIZATION CERVIX WITH BIOPSY;  Surgeon: Alvino Chapel, MD;  Location: [facility];  Service: Gynecology;  Laterality: N/A;  patient returned to OR for fulgeration to stop bleeding   CO2 LASER APPLICATION N/A 2/70/3500   Procedure: CO2 LASER OF THE VAGINA;  Surgeon: [name],  MD;  Location: [facility];  Service: Gynecology;  Laterality: N/A;   COLONOSCOPY  02-23-2010   DILATION AND CURETTAGE OF UTERUS     SALPINGOOPHORECTOMY Bilateral 07/22/2014   Procedure: BILATERAL SALPINGO OOPHORECTOMY;  Surgeon: Terrance Mass, MD;  Location: [facility];  Service: Gynecology;  Laterality: Bilateral;   TUBAL LIGATION     Family History  Problem Relation Age of Onset   Hypertension Mother    Diabetes Father    Hypertension Father    Heart disease Maternal Aunt        CHF   Ovarian cancer Sister    Colon cancer Neg Hx    Esophageal cancer Neg Hx    Pancreatic cancer Neg Hx    Stomach cancer Neg Hx    Social History   Socioeconomic History   Marital status: Widowed    Spouse name: Not on file   Number of children: 2   Years of education: 62   Highest education level: Not on file  Occupational History   Occupation: retired   Tobacco Use   Smoking status: Never Smoker   Smokeless tobacco: Never Used  Scientific laboratory technician Use: Never used  Substance and Sexual Activity   Alcohol use: Yes    Alcohol/week: 0.0 standard drinks    Comment: rare; 1 beer per year   Drug use: No   Sexual activity: Not Currently    Comment: 1st intercourse- 19, partners- 2, widow  Other Topics Concern   Not on file  Social History Narrative   Patient is widowed with 2 children   Patient is right handed   Patient has a high school education   Patient drinks 1 cup daily   Social Determinants of Health   Financial Resource Strain: Low Risk    Difficulty of Paying Living Expenses: Not hard at all  Food Insecurity: No Food Insecurity   Worried About Charity fundraiser in the Last Year: Never true   Arboriculturist in the Last Year: Never true  Transportation Needs: No Transportation Needs   Lack of Transportation (Medical): No   Lack of Transportation (Non-Medical): No  Physical Activity: Inactive   Days of Exercise per Week: 0 days   Minutes of Exercise per  Session: 0 min  Stress: No Stress Concern Present   Feeling of Stress : Not at all  Social Connections: Moderately Integrated   Frequency of Communication with Friends and Family: More than three times a week   Frequency of Social Gatherings with Friends and Family: More than three times a week   Attends Religious Services: More than 4 times per year   Active Member of [employer] or Organizations: Yes   Attends Archivist Meetings: More than 4 times per year   Marital Status: Widowed    Tobacco Counseling Counseling given: Not Answered   Clinical Intake:  Pre-visit preparation completed: Yes  Pain : No/denies pain Pain Score:  0-No pain     BMI - recorded: 26.09 Nutritional Status: BMI 25 -29 Overweight Nutritional Risks: None Diabetes: No  How often do you need to have someone help you when you read instructions, pamphlets, or other written materials from your doctor or pharmacy?: 1 - Never What is the last grade level you completed in school?: High School Graduate  Diabetic? no  Interpreter Needed?: No  Information entered by :: Lisette Abu, LPN   Activities of Daily Living In your present state of health, do you have any difficulty performing the following activities: 12/14/2020  Hearing? N  Vision? N  Difficulty concentrating or making decisions? Y  Walking or climbing stairs? N  Dressing or bathing? N  Doing errands, shopping? N  Preparing Food and eating ? N  Using the Toilet? N  In the past six months, have you accidently leaked urine? N  Do you have problems with loss of bowel control? N  Managing your Medications? N  Managing your Finances? N  Housekeeping or managing your Housekeeping? N  Some recent data might be hidden    Patient Care Team: Plotnikov, Evie Lacks, MD as PCP - General Lafayette Dragon, MD (Inactive) (Gastroenterology)  Indicate any recent Medical Services you may have received from other than [facility] providers in the past year  (date may be approximate).     Assessment:   This is a routine wellness examination for [place].  Hearing/Vision screen No exam data present  Dietary issues and exercise activities discussed: Current Exercise Habits: The patient does not participate in regular exercise at present, Exercise limited by: None identified  Goals Addressed   None    Depression Screen PHQ 2/9 Scores 12/14/2020 11/16/2020 09/23/2019 08/04/2017 08/03/2016 07/08/2015  PHQ - 2 Score 0 0 0 0 0 0  PHQ- 9 Score 0 0 - - - -    Fall Risk Fall Risk  12/14/2020 11/16/2020 09/23/2019 08/04/2017 08/03/2016  Falls in the past year? 1 1 0 No No  Comment - - - - -  Number falls in past yr: 0 0 - - -  Injury with Fall? 0 0 - - -  Risk for fall due to : History of fall(s) History of fall(s) - - -  Follow up Falls evaluation completed - Falls evaluation completed - -    FALL RISK PREVENTION PERTAINING TO THE HOME:  Any stairs in or around the home? No  If so, are there any without handrails? No  Home free of loose throw rugs in walkways, pet beds, electrical cords, etc? Yes  Adequate lighting in your home to reduce risk of falls? Yes   ASSISTIVE DEVICES UTILIZED TO PREVENT FALLS:  Life alert? No  Use of a cane, walker or w/c? No  Grab bars in the bathroom? No  Shower chair or bench in shower? No  Elevated toilet seat or a handicapped toilet? No   TIMED UP AND GO:  Was the test performed? No .  Length of time to ambulate 10 feet: 0 sec.   Gait steady and fast without use of assistive device  Cognitive Function: Normal cognitive status assessed by direct observation by this Nurse Health Advisor. No abnormalities found.          Immunizations Immunization History  Administered Date(s) Administered   PFIZER(Purple Top)SARS-COV-2 Vaccination 02/17/2020, 03/09/2020    TDAP status: Due, Education has been provided regarding the importance of this vaccine. Advised may receive this vaccine at local pharmacy or Health  Dept. Aware to provide a copy of the vaccination record if obtained from local pharmacy or Health Dept. Verbalized acceptance and understanding.  Flu Vaccine status: Declined, Education has been provided regarding the importance of this vaccine but patient still declined. Advised may receive this vaccine at local pharmacy or Health Dept. Aware to provide a copy of the vaccination record if obtained from local pharmacy or Health Dept. Verbalized acceptance and understanding.  Pneumococcal vaccine status: Declined,  Education has been provided regarding the importance of this vaccine but patient still declined. Advised may receive this vaccine at local pharmacy or Health Dept. Aware to provide a copy of the vaccination record if obtained from local pharmacy or Health Dept. Verbalized acceptance and understanding.   Covid-19 vaccine status: Declined, Education has been provided regarding the importance of this vaccine but patient still declined. Advised may receive this vaccine at local pharmacy or Health Dept.or vaccine clinic. Aware to provide a copy of the vaccination record if obtained from local pharmacy or Health Dept. Verbalized acceptance and understanding.  Qualifies for Shingles Vaccine? Yes   Zostavax completed No   Shingrix Completed?: No.    Education has been provided regarding the importance of this vaccine. Patient has been advised to call insurance company to determine out of pocket expense if they have not yet received this vaccine. Advised may also receive vaccine at local pharmacy or Health Dept. Verbalized acceptance and understanding.  Screening Tests Health Maintenance  Topic Date Due   Pneumococcal Vaccine 74-54 Years old (1 of 4 - PCV13) Never done   Zoster Vaccines- Shingrix (1 of 2) Never done   PNA vac Low Risk Adult (1 of 2 - PCV13) Never done   TETANUS/TDAP  07/12/2015   COVID-19 Vaccine (3 - Pfizer risk 4-dose series) 04/06/2020   INFLUENZA VACCINE  02/08/2021   DEXA  SCAN  Completed   Hepatitis C Screening  Completed   HPV VACCINES  Aged Out    Health Maintenance  Health Maintenance Due  Topic Date Due   Pneumococcal Vaccine 110-44 Years old (1 of 4 - PCV13) Never done   Zoster Vaccines- Shingrix (1 of 2) Never done   PNA vac Low Risk Adult (1 of 2 - PCV13) Never done   TETANUS/TDAP  07/12/2015   COVID-19 Vaccine (3 - Pfizer risk 4-dose series) 04/06/2020    Colorectal cancer screening: No longer required.   Mammogram status: No longer required due to patient refusal.  Bone Density status: Completed 12/12/2017. Results reflect: Bone density results: OSTEOPENIA. Repeat every 0 years. (completed or no longer recommended)  Lung Cancer Screening: (Low Dose CT Chest recommended if Age 26-80 years, 30 pack-year currently smoking OR have quit w/in 15years.) does not qualify.   Lung Cancer Screening Referral: no  Additional Screening:  Hepatitis C Screening: does qualify; Completed yes  Vision Screening: Recommended annual ophthalmology exams for early detection of glaucoma and other disorders of the eye. Is the patient up to date with their annual eye exam?  Yes  Who is the provider or what is the name of the office in which the patient attends annual eye exams? Clent Jacks, MD. If pt is not established with a provider, would they like to be referred to a provider to establish care? No .   Dental Screening: Recommended annual dental exams for proper oral hygiene  Community Resource Referral / Chronic Care Management: CRR required this visit?  No   CCM required this visit?  No  Plan:     I have personally reviewed and noted the following in the patient's chart:   Medical and social history Use of alcohol, tobacco or illicit drugs  Current medications and supplements including opioid prescriptions.  Functional ability and status Nutritional status Physical activity Advanced directives List of other physicians Hospitalizations,  surgeries, and ER visits in previous 12 months Vitals Screenings to include cognitive, depression, and falls Referrals and appointments  In addition, I have reviewed and discussed with patient certain preventive protocols, quality metrics, and best practice recommendations. A written personalized care plan for preventive services as well as general preventive health recommendations were provided to patient.     Sheral Flow, LPN   02/11/8183   Nurse Notes:  Medications reviewed with patient; no opioid use noted.  Medical screening examination/treatment/procedure(s) were performed by non-physician practitioner and as supervising physician I was immediately available for consultation/collaboration.  I agree with above. Lew Dawes, MD

## 2020-12-14 NOTE — Patient Instructions (Signed)
Laurie Pratt , Thank you for taking time to come for your Medicare Wellness Visit. I appreciate your ongoing commitment to your health goals. Please review the following plan we discussed and let me know if I can assist you in the future.   Screening recommendations/referrals: Colonoscopy: not a candidate for colon cancer screening due to age. Mammogram: last done 03/06/2013 Bone Density: last done 12/12/2017 Recommended yearly ophthalmology/optometry visit for glaucoma screening and checkup Recommended yearly dental visit for hygiene and checkup  Vaccinations: Influenza vaccine: declined Pneumococcal vaccine: declined Tdap vaccine: declined Shingles vaccine: declined   Covid-19: [place] 8/9/201, 03/09/2020  Advanced directives: Advance directive discussed with you today. Even though you declined this today please call our office should you change your mind and we can give you the proper paperwork for you to fill out.  Conditions/risks identified: Yes; Reviewed health maintenance screenings with patient today and relevant education, vaccines, and/or referrals were provided. Please continue to do your personal lifestyle choices by: daily care of teeth and gums, regular physical activity (goal should be 5 days a week for 30 minutes), eat a healthy diet, avoid tobacco and drug use, limiting any alcohol intake, taking a low-dose aspirin (if not allergic or have been advised by your provider otherwise) and taking vitamins and minerals as recommended by your provider. Continue doing brain stimulating activities (puzzles, reading, adult coloring books, staying active) to keep memory sharp. Continue to eat heart healthy diet (full of fruits, vegetables, whole grains, lean protein, water--limit salt, fat, and sugar intake) and increase physical activity as tolerated.  Next appointment: Please schedule your next Medicare Wellness Visit with your Nurse Health Advisor in 1 year by calling  [phone].   Preventive Care 50 Years and Older, Female Preventive care refers to lifestyle choices and visits with your health care provider that can promote health and wellness. What does preventive care include?  A yearly physical exam. This is also called an annual well check.  Dental exams once or twice a year.  Routine eye exams. Ask your health care provider how often you should have your eyes checked.  Personal lifestyle choices, including:  Daily care of your teeth and gums.  Regular physical activity.  Eating a healthy diet.  Avoiding tobacco and drug use.  Limiting alcohol use.  Practicing safe sex.  Taking low-dose aspirin every day.  Taking vitamin and mineral supplements as recommended by your health care provider. What happens during an annual well check? The services and screenings done by your health care provider during your annual well check will depend on your age, overall health, lifestyle risk factors, and family history of disease. Counseling  Your health care provider may ask you questions about your:  Alcohol use.  Tobacco use.  Drug use.  Emotional well-being.  Home and relationship well-being.  Sexual activity.  Eating habits.  History of falls.  Memory and ability to understand (cognition).  Work and work Statistician.  Reproductive health. Screening  You may have the following tests or measurements:  Height, weight, and BMI.  Blood pressure.  Lipid and cholesterol levels. These may be checked every 5 years, or more frequently if you are over 45 years old.  Skin check.  Lung cancer screening. You may have this screening every year starting at age 76 if you have a 30-pack-year history of smoking and currently smoke or have quit within the past 15 years.  Fecal occult blood test (FOBT) of the stool. You may have this test every year  starting at age 69.  Flexible sigmoidoscopy or colonoscopy. You may have a  sigmoidoscopy every 5 years or a colonoscopy every 10 years starting at age 66.  Hepatitis C blood test.  Hepatitis B blood test.  Sexually transmitted disease (STD) testing.  Diabetes screening. This is done by checking your blood sugar (glucose) after you have not eaten for a while (fasting). You may have this done every 1-3 years.  Bone density scan. This is done to screen for osteoporosis. You may have this done starting at age 37.  Mammogram. This may be done every 1-2 years. Talk to your health care provider about how often you should have regular mammograms. Talk with your health care provider about your test results, treatment options, and if necessary, the need for more tests. Vaccines  Your health care provider may recommend certain vaccines, such as:  Influenza vaccine. This is recommended every year.  Tetanus, diphtheria, and acellular pertussis (Tdap, Td) vaccine. You may need a Td booster every 10 years.  Zoster vaccine. You may need this after age 54.  Pneumococcal 13-valent conjugate (PCV13) vaccine. One dose is recommended after age 63.  Pneumococcal polysaccharide (PPSV23) vaccine. One dose is recommended after age 61. Talk to your health care provider about which screenings and vaccines you need and how often you need them. This information is not intended to replace advice given to you by your health care provider. Make sure you discuss any questions you have with your health care provider. Document Released: 07/24/2015 Document Revised: 03/16/2016 Document Reviewed: 04/28/2015 Elsevier Interactive Patient Education  2017 [place] Prevention in the Home Falls can cause injuries. They can happen to people of all ages. There are many things you can do to make your home safe and to help prevent falls. What can I do on the outside of my home?  Regularly fix the edges of walkways and driveways and fix any cracks.  Remove anything that might make you  trip as you walk through a door, such as a raised step or threshold.  Trim any bushes or trees on the path to your home.  Use bright outdoor lighting.  Clear any walking paths of anything that might make someone trip, such as rocks or tools.  Regularly check to see if handrails are loose or broken. Make sure that both sides of any steps have handrails.  Any raised decks and porches should have guardrails on the edges.  Have any leaves, snow, or ice cleared regularly.  Use sand or salt on walking paths during winter.  Clean up any spills in your garage right away. This includes oil or grease spills. What can I do in the bathroom?  Use night lights.  Install grab bars by the toilet and in the tub and shower. Do not use towel bars as grab bars.  Use non-skid mats or decals in the tub or shower.  If you need to sit down in the shower, use a plastic, non-slip stool.  Keep the floor dry. Clean up any water that spills on the floor as soon as it happens.  Remove soap buildup in the tub or shower regularly.  Attach bath mats securely with double-sided non-slip rug tape.  Do not have throw rugs and other things on the floor that can make you trip. What can I do in the bedroom?  Use night lights.  Make sure that you have a light by your bed that is easy to reach.  Do not  use any sheets or blankets that are too big for your bed. They should not hang down onto the floor.  Have a firm chair that has side arms. You can use this for support while you get dressed.  Do not have throw rugs and other things on the floor that can make you trip. What can I do in the kitchen?  Clean up any spills right away.  Avoid walking on wet floors.  Keep items that you use a lot in easy-to-reach places.  If you need to reach something above you, use a strong step stool that has a grab bar.  Keep electrical cords out of the way.  Do not use floor polish or wax that makes floors slippery. If  you must use wax, use non-skid floor wax.  Do not have throw rugs and other things on the floor that can make you trip. What can I do with my stairs?  Do not leave any items on the stairs.  Make sure that there are handrails on both sides of the stairs and use them. Fix handrails that are broken or loose. Make sure that handrails are as long as the stairways.  Check any carpeting to make sure that it is firmly attached to the stairs. Fix any carpet that is loose or worn.  Avoid having throw rugs at the top or bottom of the stairs. If you do have throw rugs, attach them to the floor with carpet tape.  Make sure that you have a light switch at the top of the stairs and the bottom of the stairs. If you do not have them, ask someone to add them for you. What else can I do to help prevent falls?  Wear shoes that:  Do not have high heels.  Have rubber bottoms.  Are comfortable and fit you well.  Are closed at the toe. Do not wear sandals.  If you use a stepladder:  Make sure that it is fully opened. Do not climb a closed stepladder.  Make sure that both sides of the stepladder are locked into place.  Ask someone to hold it for you, if possible.  Clearly mark and make sure that you can see:  Any grab bars or handrails.  First and last steps.  Where the edge of each step is.  Use tools that help you move around (mobility aids) if they are needed. These include:  Canes.  Walkers.  Scooters.  Crutches.  Turn on the lights when you go into a dark area. Replace any light bulbs as soon as they burn out.  Set up your furniture so you have a clear path. Avoid moving your furniture around.  If any of your floors are uneven, fix them.  If there are any pets around you, be aware of where they are.  Review your medicines with your doctor. Some medicines can make you feel dizzy. This can increase your chance of falling. Ask your doctor what other things that you can do to  help prevent falls. This information is not intended to replace advice given to you by your health care provider. Make sure you discuss any questions you have with your health care provider. Document Released: 04/23/2009 Document Revised: 12/03/2015 Document Reviewed: 08/01/2014 Elsevier Interactive Patient Education  2017 [employer].

## 2021-02-11 ENCOUNTER — Other Ambulatory Visit: Payer: Self-pay | Admitting: Internal Medicine

## 2021-05-18 ENCOUNTER — Encounter: Payer: Self-pay | Admitting: Internal Medicine

## 2021-05-18 ENCOUNTER — Other Ambulatory Visit: Payer: Self-pay

## 2021-05-18 ENCOUNTER — Ambulatory Visit (INDEPENDENT_AMBULATORY_CARE_PROVIDER_SITE_OTHER): Payer: Medicare Other | Admitting: Internal Medicine

## 2021-05-18 VITALS — BP 142/78 | HR 56 | Temp 98.2°F | Ht 65.5 in | Wt 171.2 lb

## 2021-05-18 DIAGNOSIS — K8020 Calculus of gallbladder without cholecystitis without obstruction: Secondary | ICD-10-CM

## 2021-05-18 DIAGNOSIS — Z86010 Personal history of colonic polyps: Secondary | ICD-10-CM

## 2021-05-18 DIAGNOSIS — E876 Hypokalemia: Secondary | ICD-10-CM | POA: Diagnosis not present

## 2021-05-18 DIAGNOSIS — N183 Chronic kidney disease, stage 3 unspecified: Secondary | ICD-10-CM | POA: Diagnosis not present

## 2021-05-18 DIAGNOSIS — Z8541 Personal history of malignant neoplasm of cervix uteri: Secondary | ICD-10-CM | POA: Insufficient documentation

## 2021-05-18 DIAGNOSIS — Z0000 Encounter for general adult medical examination without abnormal findings: Secondary | ICD-10-CM | POA: Diagnosis not present

## 2021-05-18 DIAGNOSIS — I10 Essential (primary) hypertension: Secondary | ICD-10-CM | POA: Diagnosis not present

## 2021-05-18 DIAGNOSIS — R198 Other specified symptoms and signs involving the digestive system and abdomen: Secondary | ICD-10-CM | POA: Insufficient documentation

## 2021-05-18 LAB — COMPREHENSIVE METABOLIC PANEL
ALT: 13 U/L (ref 0–35)
AST: 22 U/L (ref 0–37)
Albumin: 4 g/dL (ref 3.5–5.2)
Alkaline Phosphatase: 85 U/L (ref 39–117)
BUN: 22 mg/dL (ref 6–23)
CO2: 24 mEq/L (ref 19–32)
Calcium: 9.5 mg/dL (ref 8.4–10.5)
Chloride: 101 mEq/L (ref 96–112)
Creatinine, Ser: 1.22 mg/dL — ABNORMAL HIGH (ref 0.40–1.20)
GFR: 42.69 mL/min — ABNORMAL LOW (ref >60.00)
Glucose, Bld: 87 mg/dL (ref 70–99)
Potassium: 3.4 mEq/L — ABNORMAL LOW (ref 3.5–5.1)
Sodium: 135 mEq/L (ref 135–145)
Total Bilirubin: 0.4 mg/dL (ref 0.2–1.2)
Total Protein: 7.6 g/dL (ref 6.0–8.3)

## 2021-05-18 LAB — LIPID PANEL
Cholesterol: 233 mg/dL — ABNORMAL HIGH (ref 0–200)
HDL: 55.3 mg/dL (ref >39.00)
LDL Cholesterol: 155 mg/dL — ABNORMAL HIGH (ref 0–99)
NonHDL: 178.14
Total CHOL/HDL Ratio: 4
Triglycerides: 114 mg/dL (ref 0.0–149.0)
VLDL: 22.8 mg/dL (ref 0.0–40.0)

## 2021-05-18 LAB — URINALYSIS, ROUTINE W REFLEX MICROSCOPIC
Bilirubin Urine: NEGATIVE
Hgb urine dipstick: NEGATIVE
Ketones, ur: NEGATIVE
Nitrite: NEGATIVE
RBC / HPF: NONE SEEN (ref 0–?)
Specific Gravity, Urine: <=1.005 — AB (ref 1.000–1.030)
Total Protein, Urine: NEGATIVE
Urine Glucose: NEGATIVE
Urobilinogen, UA: 0.2 (ref 0.0–1.0)
pH: 7 (ref 5.0–8.0)

## 2021-05-18 LAB — TSH: TSH: 3.8 u[IU]/mL (ref 0.35–5.50)

## 2021-05-18 NOTE — Progress Notes (Signed)
Subjective:  Patient ID: Laurie Pratt, female    DOB: [birth year]  Age: 80 y.o. MRN: [id number]  CC: Annual Exam   HPI Laurie Pratt presents for a well exam F/u on blepharospasm, GB stones, HTN C/o abdomen getting bigger...  Outpatient Medications Prior to Visit  Medication Sig Dispense Refill   atenolol-chlorthalidone (TENORETIC) 100-25 MG tablet Take 1 tablet by mouth every morning. 90 tablet 3   Cholecalciferol (VITAMIN D3) 1000 units CAPS Take 1 capsule by mouth every morning.     latanoprost (XALATAN) 0.005 % ophthalmic solution Place 1 drop into both eyes at bedtime.     losartan (COZAAR) 100 MG tablet TAKE 1 TABLET(100 MG) BY MOUTH EVERY MORNING appt is due must see provider for refills 90 tablet 3   trihexyphenidyl (ARTANE) 2 MG tablet TAKE 1 AND 1/2 TABLETS BY MOUTH THREE TIMES DAILY. 410 tablet 1   No facility-administered medications prior to visit.    ROS: Review of Systems  Constitutional:  Negative for activity change, appetite change, chills, fatigue and unexpected weight change.  HENT:  Negative for congestion, mouth sores, rhinorrhea and sinus pressure.   Eyes:  Negative for visual disturbance.  Respiratory:  Negative for cough and chest tightness.   Gastrointestinal:  Positive for abdominal distention. Negative for abdominal pain, blood in stool, nausea, rectal pain and vomiting.  Genitourinary:  Negative for difficulty urinating, frequency, urgency and vaginal pain.  Musculoskeletal:  Negative for back pain and gait problem.  Skin:  Negative for pallor and rash.  Neurological:  Positive for facial asymmetry. Negative for dizziness, weakness, numbness and headaches.  Hematological:  Does not bruise/bleed easily.  Psychiatric/Behavioral:  Negative for confusion and sleep disturbance.    Objective:  BP (!) 142/78 (BP Location: Left Arm)   Pulse (!) 56   Temp 98.2 F (36.8 C) (Oral)   Ht 5' 5.5" (1.664 m)   Wt 171 lb 3.2 oz (77.7 kg)   SpO2 97%    BMI 28.06 kg/m   BP Readings from Last 3 Encounters:  05/18/21 (!) 142/78  12/14/20 128/60  11/16/20 132/70    Wt Readings from Last 3 Encounters:  05/18/21 171 lb 3.2 oz (77.7 kg)  12/14/20 166 lb 9.6 oz (75.6 kg)  11/16/20 167 lb 6.4 oz (75.9 kg)    Physical Exam Constitutional:      General: She is not in acute distress.    Appearance: She is well-developed. She is obese.  HENT:     Head: Normocephalic.     Right Ear: External ear normal.     Left Ear: External ear normal.     Nose: Nose normal.  Eyes:     General:        Right eye: No discharge.        Left eye: No discharge.     Conjunctiva/sclera: Conjunctivae normal.     Pupils: Pupils are equal, round, and reactive to light.  Neck:     Thyroid: No thyromegaly.     Vascular: No JVD.     Trachea: No tracheal deviation.  Cardiovascular:     Rate and Rhythm: Normal rate and regular rhythm.     Heart sounds: Normal heart sounds.  Pulmonary:     Effort: No respiratory distress.     Breath sounds: No stridor. No wheezing.  Abdominal:     General: Bowel sounds are normal. There is no distension.     Palpations: Abdomen is soft. There is no mass.  Tenderness: There is no abdominal tenderness. There is no right CVA tenderness, left CVA tenderness, guarding or rebound.  Musculoskeletal:        General: No tenderness.     Cervical back: Normal range of motion and neck supple. No rigidity.     Right lower leg: No edema.     Left lower leg: No edema.  Lymphadenopathy:     Cervical: No cervical adenopathy.  Skin:    Findings: No erythema or rash.  Neurological:     Mental Status: She is oriented to person, place, and time.     Cranial Nerves: No cranial nerve deficit.     Motor: No abnormal muscle tone.     Coordination: Coordination normal.     Gait: Gait normal.     Deep Tendon Reflexes: Reflexes normal.  Psychiatric:        Behavior: Behavior normal.        Thought Content: Thought content normal.         Judgment: Judgment normal.  Facial tics  I spent 22 minutes in addition to time for CPX wellness examination in preparing to see the patient by review of recent labs, imaging and procedures, obtaining and reviewing separately obtained history, communicating with the patient, ordering medications, tests or procedures, and documenting clinical information in the EHR including the differential diagnosis, treatment, and any further evaluation and other management of GB stones, polyps, abd enlargement          Lab Results  Component Value Date   WBC 4.4 01/02/2018   HGB 13.6 01/02/2018   HCT 43.0 01/02/2018   PLT 279 01/02/2018   GLUCOSE 101 (H) 01/02/2018   CHOL 209 (H) 08/04/2017   TRIG 136.0 08/04/2017   HDL 51.90 08/04/2017   LDLDIRECT 134.0 07/08/2015   LDLCALC 130 (H) 08/04/2017   ALT 16 01/02/2018   AST 20 01/02/2018   NA 141 01/02/2018   K 3.5 01/02/2018   CL 105 01/02/2018   CREATININE 1.39 (H) 01/02/2018   BUN 17 01/02/2018   CO2 27 01/02/2018   TSH 4.08 08/04/2017   INR 1.0 06/09/2014    No results found.  Assessment & Plan:   Problem List Items Addressed This Visit     Abdominal enlargement    Per pt - subjective, likely due to wt gain Obtain US, LFTs      Relevant Orders   US Abdomen Limited RUQ (LIVER/GB)   Ambulatory referral to Gastroenterology   Cholelithiasis    Chronic - seen on abd CT/US; no symptoms Check [place], LFTs      Relevant Orders   US Abdomen Limited RUQ (LIVER/GB)   Essential hypertension    Cont on Losartan, Tenoretic      History of cervical cancer   History of colonic polyps    Colonoscopy adviced      Relevant Orders   Ambulatory referral to Gastroenterology   Well adult exam - Primary    We discussed age appropriate health related issues, including available/recomended screening tests and vaccinations. Labs were ordered to be later reviewed . All questions were answered. We discussed one or more of the following - seat belt  use, use of sunscreen/sun exposure exercise, fall risk reduction, second hand smoke exposure, firearm use and storage, seat belt use, a need for adhering to healthy diet and exercise. Labs were ordered.  All questions were answered. Colon is past due 2020 - will refer to GI Declined all shots  Relevant Orders   TSH   Urinalysis   CBC with Differential/Platelet   Lipid panel   Comprehensive metabolic panel      No orders of the defined types were placed in this encounter.     Follow-up: Return in about 6 weeks (around 06/29/2021) for a follow-up visit.  Walker Kehr, MD

## 2021-05-18 NOTE — Assessment & Plan Note (Addendum)
Per pt - subjective, likely due to wt gain Obtain abdominal US, LFTs

## 2021-05-18 NOTE — Assessment & Plan Note (Addendum)
Chronic - seen on abd CT/US; no symptoms Check [place], LFTs

## 2021-05-18 NOTE — Assessment & Plan Note (Signed)
We discussed age appropriate health related issues, including available/recomended screening tests and vaccinations. Labs were ordered to be later reviewed . All questions were answered. We discussed one or more of the following - seat belt use, use of sunscreen/sun exposure exercise, fall risk reduction, second hand smoke exposure, firearm use and storage, seat belt use, a need for adhering to healthy diet and exercise. Labs were ordered.  All questions were answered. Colon is past due 2020 - will refer to GI Declined all shots

## 2021-05-18 NOTE — Assessment & Plan Note (Signed)
Colonoscopy adviced

## 2021-05-18 NOTE — Assessment & Plan Note (Signed)
Cont on Losartan, Tenoretic

## 2021-05-19 ENCOUNTER — Telehealth: Payer: Self-pay | Admitting: *Deleted

## 2021-05-19 DIAGNOSIS — Z0000 Encounter for general adult medical examination without abnormal findings: Secondary | ICD-10-CM

## 2021-05-19 LAB — CBC WITH DIFFERENTIAL/PLATELET
Basophils Absolute: 0.1 10*3/uL (ref 0.0–0.1)
Basophils Relative: 1.2 % (ref 0.0–3.0)
Eosinophils Absolute: 0.2 10*3/uL (ref 0.0–0.7)
Eosinophils Relative: 2.8 % (ref 0.0–5.0)
HCT: 41 % (ref 36.0–46.0)
Hemoglobin: 13.6 g/dL (ref 12.0–15.0)
Lymphocytes Relative: 38.2 % (ref 12.0–46.0)
Lymphs Abs: 2.3 10*3/uL (ref 0.7–4.0)
MCHC: 33.3 g/dL (ref 30.0–36.0)
MCV: 87.3 fl (ref 78.0–100.0)
Monocytes Absolute: 0.9 10*3/uL (ref 0.1–1.0)
Monocytes Relative: 14.2 % — ABNORMAL HIGH (ref 3.0–12.0)
Neutro Abs: 2.6 10*3/uL (ref 1.4–7.7)
Neutrophils Relative %: 43.6 % (ref 43.0–77.0)
Platelets: 272 10*3/uL (ref 150.0–400.0)
RBC: 4.69 Mil/uL (ref 3.87–5.11)
RDW: 14.1 % (ref 11.5–15.5)
WBC: 6 10*3/uL (ref 4.0–10.5)

## 2021-05-19 NOTE — Addendum Note (Signed)
Addended by: Jacobo Forest on: 05/19/2021 04:09 PM   Modules accepted: Orders

## 2021-05-19 NOTE — Telephone Encounter (Signed)
RAEC'D MSG FROM LABS STATING " [3:07 PM] Ewell Poe Hey beautiful! Laurie Pratt's blood clotted and I tried to reach out to get her to come back in for a redraw and I got the VM. I just wanted to let you know and maybe you know a better way to reach out. MRN: [id number] thank you so much!  [3:10 PM] Neita Garnet.. No she is not on mychart. You can call her daughter Amisha Pospisil @ [phone] She was able to reach daughter,and the pt is coming back to have cbc done. Re-enter CBC

## 2021-05-23 ENCOUNTER — Encounter: Payer: Self-pay | Admitting: Internal Medicine

## 2021-05-23 DIAGNOSIS — N183 Chronic kidney disease, stage 3 unspecified: Secondary | ICD-10-CM | POA: Insufficient documentation

## 2021-05-23 DIAGNOSIS — E876 Hypokalemia: Secondary | ICD-10-CM | POA: Insufficient documentation

## 2021-05-23 MED ORDER — POTASSIUM CHLORIDE CRYS ER 10 MEQ PO TBCR: 10.0000 meq | EXTENDED_RELEASE_TABLET | Freq: Two times a day (BID) | ORAL | 3 refills | Status: DC

## 2021-05-23 NOTE — Assessment & Plan Note (Signed)
GFR is 44 Needs to hydrate well.  Obtain renal ultrasound

## 2021-05-23 NOTE — Assessment & Plan Note (Signed)
Start potassium

## 2021-05-23 NOTE — Addendum Note (Signed)
Addended by: Cassandria Anger on: 05/23/2021 08:04 PM   Modules accepted: Orders

## 2021-06-02 ENCOUNTER — Other Ambulatory Visit: Payer: Self-pay | Admitting: Internal Medicine

## 2021-06-14 ENCOUNTER — Ambulatory Visit
Admission: RE | Admit: 2021-06-14 | Discharge: 2021-06-14 | Disposition: A | Discharge status: Home / Self Care | Payer: Medicare Other | Source: Ambulatory Visit | Attending: Internal Medicine | Admitting: Internal Medicine

## 2021-06-14 DIAGNOSIS — R140 Abdominal distension (gaseous): Secondary | ICD-10-CM | POA: Diagnosis not present

## 2021-06-14 DIAGNOSIS — K8020 Calculus of gallbladder without cholecystitis without obstruction: Secondary | ICD-10-CM | POA: Diagnosis not present

## 2021-06-14 DIAGNOSIS — K828 Other specified diseases of gallbladder: Secondary | ICD-10-CM | POA: Diagnosis not present

## 2021-06-14 DIAGNOSIS — N281 Cyst of kidney, acquired: Secondary | ICD-10-CM | POA: Diagnosis not present

## 2021-06-28 ENCOUNTER — Encounter: Payer: Self-pay | Admitting: Internal Medicine

## 2021-06-28 ENCOUNTER — Ambulatory Visit (INDEPENDENT_AMBULATORY_CARE_PROVIDER_SITE_OTHER): Payer: Medicare Other | Admitting: Internal Medicine

## 2021-06-28 ENCOUNTER — Other Ambulatory Visit: Payer: Self-pay

## 2021-06-28 DIAGNOSIS — N183 Chronic kidney disease, stage 3 unspecified: Secondary | ICD-10-CM | POA: Diagnosis not present

## 2021-06-28 DIAGNOSIS — R198 Other specified symptoms and signs involving the digestive system and abdomen: Secondary | ICD-10-CM

## 2021-06-28 DIAGNOSIS — E876 Hypokalemia: Secondary | ICD-10-CM

## 2021-06-28 DIAGNOSIS — K8020 Calculus of gallbladder without cholecystitis without obstruction: Secondary | ICD-10-CM | POA: Diagnosis not present

## 2021-06-28 NOTE — Patient Instructions (Signed)
Cholelithiasis °Cholelithiasis is a disease in which gallstones form in the gallbladder. The gallbladder is an organ that stores bile. Bile is a fluid that helps to digest fats. Gallstones begin as small crystals and can slowly grow into stones. They may cause no symptoms until they block the gallbladder duct, or cystic duct, when the gallbladder tightens (contracts) after food is eaten. This can cause pain and is known as a gallbladder attack, or biliary colic. °There are two main types of gallstones: °Cholesterol stones. These are the most common type of gallstone. These stones are made of hardened cholesterol and are usually yellow-green in color. Cholesterol is a fat-like substance that is made in the liver. °Pigment stones. These are dark in color and are made of a red-yellow substance, called bilirubin,that forms when hemoglobin from red blood cells breaks down. °What are the causes? °This condition may be caused by an imbalance in the different parts that make bile. This can happen if the bile: °Has too much bilirubin. This can happen in certain blood diseases, such as sickle cell anemia. °Has too much cholesterol. °Does not have enough bile salts. These salts help the body absorb and digest fats. °In some cases, this condition can also be caused by the gallbladder not emptying completely or often enough. This is common during pregnancy. °What increases the risk? °The following factors may make you more likely to develop this condition: °Being female. °Having multiple pregnancies. Health care providers sometimes advise removing diseased gallbladders before future pregnancies. °Eating a diet that is heavy in fried foods, fat, and refined carbohydrates, such as white bread and white rice. °Being obese. °Being older than age 40. °Using medicines that contain female hormones (estrogen) for a long time. °Losing weight quickly. °Having a family history of gallstones. °Having certain medical problems, such  as: °Diabetes mellitus. °Cystic fibrosis. °Crohn's disease. °Cirrhosis or other long-term (chronic) liver disease. °Certain blood diseases, such as sickle cell anemia or leukemia. °What are the signs or symptoms? °In many cases, having gallstones causes no symptoms. When you have gallstones but do not have symptoms, you have silent gallstones. If a gallstone blocks your bile duct, it can cause a gallbladder attack. The main symptom of a gallbladder attack is sudden pain in the upper right part of the abdomen. The pain: °Usually comes at night or after eating. °Can last for one hour or more. °Can spread to your right shoulder, back, or chest. °Can feel like indigestion. This is discomfort, burning, or fullness in your upper abdomen. °If the bile duct is blocked for more than a few hours, it can cause an infection or inflammation of your gallbladder (cholecystitis), liver, or pancreas. This can cause: °Nausea or vomiting. °Bloating. °Pain in your abdomen that lasts for 5 hours or longer. °Tenderness in your upper abdomen, often in the upper right section and under your rib cage. °Fever or chills. °Skin or the white parts of your eyes turning yellow (jaundice). This usually happens when a stone has blocked bile from passing through the common bile duct. °Dark urine or light-colored stools. °How is this diagnosed? °This condition may be diagnosed based on: °A physical exam. °Your medical history. °Ultrasound. °CT scan. °MRI. °You may also have other tests, including: °Blood tests to check for signs of an infection or inflammation. °Cholescintigraphy, or HIDA scan. This is a scan of your gallbladder and bile ducts (biliary system) using non-harmful radioactive material and special cameras that can see the radioactive material. °Endoscopic retrograde cholangiopancreatogram. This involves   inserting a small tube with a camera on the end (endoscope) through your mouth to look at bile ducts and check for blockages. °How is  this treated? °Treatment for this condition depends on the severity of the condition. Silent gallstones do not need treatment. Treatment may be needed if a blockage causes a gallbladder attack or other symptoms. Treatment may include: °Home care, if symptoms are not severe. °During a simple gallbladder attack, stop eating and drinking for 12-24 hours (except for water and clear liquids). This helps to "cool down" your gallbladder. After 1 or 2 days, you can start to eat a diet of simple or clear foods, such as broths and crackers. °You may also need medicines for pain or nausea or both. °If you have cholecystitis and an infection, you will need antibiotics. °A hospital stay, if needed for pain control or for cholecystitis with severe infection. °Cholecystectomy, or surgery to remove your gallbladder. This is the most common treatment if all other treatments have not worked. °Medicines to break up gallstones. These are most effective at treating small gallstones. Medicines may be used for up to 6-12 months. °Endoscopic retrograde cholangiopancreatogram. A small basket can be attached to the endoscope and used to capture and remove gallstones, mainly those that are in the common bile duct. °Follow these instructions at home: °Medicines °Take over-the-counter and prescription medicines only as told by your health care provider. °If you were prescribed an antibiotic medicine, take it as told by your health care provider. Do not stop taking the antibiotic even if you start to feel better. °Ask your health care provider if the medicine prescribed to you requires you to avoid driving or using machinery. °Eating and drinking °Drink enough fluid to keep your urine pale yellow. This is important during a gallbladder attack. Water and clear liquids are preferred. °Follow a healthy diet. This includes: °Reducing fatty foods, such as fried food and foods high in cholesterol. °Reducing refined carbohydrates, such as white bread  and white rice. °Eating more fiber. Aim for foods such as almonds, fruit, and beans. °Alcohol use °If you drink alcohol: °Limit how much you use to: °0-1 drink a day for nonpregnant women. °0-2 drinks a day for men. °Be aware of how much alcohol is in your drink. In the [place] one drink equals one 12 oz bottle of beer (355 mL), one 5 oz glass of wine (148 mL), or one 1½ oz glass of hard liquor (44 mL). °General instructions °Do not use any products that contain nicotine or tobacco, such as cigarettes, e-cigarettes, and chewing tobacco. If you need help quitting, ask your health care provider. °Maintain a healthy weight. °Keep all follow-up visits as told by your health care provider. These may include consultations with a surgeon or specialist. This is important. °Where to find more information °National Institute of Diabetes and Digestive and Kidney Diseases: www.niddk.nih.gov °Contact a health care provider if: °You think you have had a gallbladder attack. °You have been diagnosed with silent gallstones and you develop pain in your abdomen or indigestion. °You begin to have attacks more often. °You have dark urine or light-colored stools. °Get help right away if: °You have pain from a gallbladder attack that lasts for more than 2 hours. °You have pain in your abdomen that lasts for more than 5 hours or is getting worse. °You have a fever or chills. °You have nausea and vomiting that do not go away. °You develop jaundice. °Summary °Cholelithiasis is a disease in which gallstones   form in the gallbladder. °This condition may be caused by an imbalance in the different parts that make bile. This can happen if your bile has too much bilirubin or cholesterol, or does not have enough bile salts. °Treatment for gallstones depends on the severity of the condition. Silent gallstones do not need treatment. °If gallstones cause a gallbladder attack or other symptoms, treatment usually involves not eating or drinking anything.  Treatment may also include pain medicines and antibiotics, and it sometimes includes a hospital stay. °Surgery to remove the gallbladder is common if all other treatments have not worked. °This information is not intended to replace advice given to you by your health care provider. Make sure you discuss any questions you have with your health care provider. °Document Revised: 05/20/2019 Document Reviewed: 05/20/2019 °Elsevier Patient Education © 2022 [employer] ° °

## 2021-06-28 NOTE — Progress Notes (Signed)
Subjective:  Patient ID: Laurie Pratt, female    DOB: 1940/10/20  Age: 80 y.o. MRN: [id number]  CC: Follow-up (6 week f/u)   HPI LAIKEN SANDY presents for gallstones, CRI, HTN  Outpatient Medications Prior to Visit  Medication Sig Dispense Refill   atenolol-chlorthalidone (TENORETIC) 100-25 MG tablet Take 1 tablet by mouth every morning. 90 tablet 3   Cholecalciferol (VITAMIN D3) 1000 units CAPS Take 1 capsule by mouth every morning.     latanoprost (XALATAN) 0.005 % ophthalmic solution Place 1 drop into both eyes at bedtime.     losartan (COZAAR) 100 MG tablet TAKE 1 TABLET(100 MG) BY MOUTH EVERY MORNING 90 tablet 3   potassium chloride (KLOR-CON) 10 MEQ tablet Take 1 tablet (10 mEq total) by mouth 2 (two) times daily. 90 tablet 3   trihexyphenidyl (ARTANE) 2 MG tablet TAKE 1 AND 1/2 TABLETS BY MOUTH THREE TIMES DAILY. 410 tablet 1   No facility-administered medications prior to visit.    ROS: Review of Systems  Constitutional:  Negative for activity change, appetite change, chills, fatigue and unexpected weight change.  HENT:  Negative for congestion, mouth sores and sinus pressure.   Eyes:  Negative for visual disturbance.  Respiratory:  Negative for cough and chest tightness.   Gastrointestinal:  Negative for abdominal distention, abdominal pain, nausea and vomiting.  Genitourinary:  Negative for difficulty urinating, enuresis, frequency, urgency and vaginal pain.  Musculoskeletal:  Negative for back pain and gait problem.  Skin:  Negative for pallor and rash.  Neurological:  Negative for dizziness, tremors, weakness, numbness and headaches.  Psychiatric/Behavioral:  Negative for confusion and sleep disturbance.    Objective:  BP (!) 152/72 (BP Location: Left Arm)    Pulse 64    Temp 98.4 F (36.9 C) (Oral)    Ht 5' 5.5" (1.664 m)    Wt 174 lb 9.6 oz (79.2 kg)    SpO2 94%    BMI 28.61 kg/m   BP Readings from Last 3 Encounters:  06/28/21 (!) 152/72  05/18/21 (!)  142/78  12/14/20 128/60    Wt Readings from Last 3 Encounters:  06/28/21 174 lb 9.6 oz (79.2 kg)  05/18/21 171 lb 3.2 oz (77.7 kg)  12/14/20 166 lb 9.6 oz (75.6 kg)    Physical Exam Constitutional:      General: She is not in acute distress.    Appearance: She is well-developed.  HENT:     Head: Normocephalic.     Right Ear: External ear normal.     Left Ear: External ear normal.     Nose: Nose normal.  Eyes:     General:        Right eye: No discharge.        Left eye: No discharge.     Conjunctiva/sclera: Conjunctivae normal.     Pupils: Pupils are equal, round, and reactive to light.  Neck:     Thyroid: No thyromegaly.     Vascular: No JVD.     Trachea: No tracheal deviation.  Cardiovascular:     Rate and Rhythm: Normal rate and regular rhythm.     Heart sounds: Normal heart sounds.  Pulmonary:     Effort: No respiratory distress.     Breath sounds: No stridor. No wheezing.  Abdominal:     General: Bowel sounds are normal. There is no distension.     Palpations: Abdomen is soft. There is no mass.     Tenderness: There is no  abdominal tenderness. There is no guarding or rebound.  Musculoskeletal:        General: No tenderness.     Cervical back: Normal range of motion and neck supple. No rigidity.  Lymphadenopathy:     Cervical: No cervical adenopathy.  Skin:    Findings: No erythema or rash.  Neurological:     Cranial Nerves: No cranial nerve deficit.     Motor: No abnormal muscle tone.     Coordination: Coordination normal.     Deep Tendon Reflexes: Reflexes normal.  Psychiatric:        Behavior: Behavior normal.        Thought Content: Thought content normal.        Judgment: Judgment normal.  tics  Lab Results  Component Value Date   WBC 6.0 05/19/2021   HGB 13.6 05/19/2021   HCT 41.0 05/19/2021   PLT 272.0 05/19/2021   GLUCOSE 87 05/18/2021   CHOL 233 (H) 05/18/2021   TRIG 114.0 05/18/2021   HDL 55.30 05/18/2021   LDLDIRECT 134.0 07/08/2015    LDLCALC 155 (H) 05/18/2021   ALT 13 05/18/2021   AST 22 05/18/2021   NA 135 05/18/2021   K 3.4 (L) 05/18/2021   CL 101 05/18/2021   CREATININE 1.22 (H) 05/18/2021   BUN 22 05/18/2021   CO2 24 05/18/2021   TSH 3.80 05/18/2021   INR 1.0 06/09/2014    US Abdomen Complete  Result Date: 06/15/2021 CLINICAL DATA:  Abdominal distension and renal insufficiency. EXAM: ABDOMEN ULTRASOUND COMPLETE COMPARISON:  12/25/2017 FINDINGS: Gallbladder: Gallbladder is well distended. Gallstones are seen. No wall thickening or pericholecystic fluid is noted. Common bile duct: Diameter: 3.7 mm. Liver: Diffuse heterogeneity is noted without focal mass. Mild increased echogenicity is noted consistent with fatty infiltration. Portal vein is patent on color Doppler imaging with normal direction of blood flow towards the liver. IVC: No abnormality visualized. Pancreas: Visualized portion unremarkable. Spleen: Size and appearance within normal limits. Right Kidney: Length: 9 cm. 1.5 cm cyst is noted in the midportion of the right kidney. No mass or hydronephrosis is noted. Left Kidney: Length: 8.2 cm. Echogenicity within normal limits. No mass or hydronephrosis visualized. Abdominal aorta: No aneurysm visualized. Other findings: None. IMPRESSION: Fatty infiltration of the liver. Cholelithiasis without complicating factors. Right renal cyst. Electronically Signed   By: Inez Catalina M.D.   On: 06/15/2021 02:51    Assessment & Plan:   Problem List Items Addressed This Visit     Abdominal enlargement    Nl exam. Obtain CT if not well      Cholelithiasis    No sx's per pt; she refused further w/u and surgical ref Low fat diet      CRI (chronic renal insufficiency), stage 3 (moderate) (HCC)    Monitor GFR Needs to hydrate well.  Renal ultrasound - ok in 2023      Hypokalemia    Monitor K OTC KCl         No orders of the defined types were placed in this encounter.     Follow-up: No follow-ups on  file.  Walker Kehr, MD

## 2021-06-28 NOTE — Assessment & Plan Note (Signed)
Nl exam. Obtain CT if not well

## 2021-06-28 NOTE — Assessment & Plan Note (Signed)
Monitor K OTC KCl

## 2021-06-28 NOTE — Assessment & Plan Note (Signed)
No sx's per pt; she refused further w/u and surgical ref Low fat diet

## 2021-06-28 NOTE — Assessment & Plan Note (Signed)
Monitor GFR Needs to hydrate well.  Renal ultrasound - ok in 2023

## 2021-08-19 ENCOUNTER — Encounter: Payer: Self-pay | Admitting: Internal Medicine

## 2021-08-26 ENCOUNTER — Encounter: Payer: Self-pay | Admitting: Nurse Practitioner

## 2021-09-13 ENCOUNTER — Encounter: Payer: Self-pay | Admitting: Nurse Practitioner

## 2021-09-13 ENCOUNTER — Ambulatory Visit: Payer: Medicare Other | Admitting: Nurse Practitioner

## 2021-09-13 VITALS — BP 150/60 | HR 65 | Wt 178.8 lb

## 2021-09-13 DIAGNOSIS — R140 Abdominal distension (gaseous): Secondary | ICD-10-CM

## 2021-09-13 DIAGNOSIS — R143 Flatulence: Secondary | ICD-10-CM

## 2021-09-13 NOTE — Progress Notes (Deleted)
? ? ? ?ASSESSMENT AND PLAN   ? ?# Colon cancer screening ? ?HISTORY OF PRESENT ILLNESS   ? ? ?Chief Complaint : ? ?Laurie Pratt is a 81 y.o. female with a past medical history significant for glaucome, HLD, HTN. See PMH below for any additional history.  ? ?Patient last seen in June 2019 for bloating, periumbiilcal pain ? ?Data Reviewed: ? ?CBC Latest Ref Rng & Units 05/19/2021 01/02/2018 12/20/2017  ?WBC 4.0 - 10.5 K/uL 6.0 4.4 5.1  ?Hemoglobin 12.0 - 15.0 g/dL 13.6 13.6 13.6  ?Hematocrit 36.0 - 46.0 % 41.0 43.0 39.8  ?Platelets 150.0 - 400.0 K/uL 272.0 279 319.0  ? ? ? ?CMP Latest Ref Rng & Units 05/18/2021 01/02/2018 08/04/2017  ?Glucose 70 - 99 mg/dL 87 101(H) 97  ?BUN 6 - 23 mg/dL '22 17 15  '$ ?Creatinine 0.40 - 1.20 mg/dL 1.22(H) 1.39(H) 1.18  ?Sodium 135 - 145 mEq/L 135 141 140  ?Potassium 3.5 - 5.1 mEq/L 3.4(L) 3.5 4.1  ?Chloride 96 - 112 mEq/L 101 105 101  ?CO2 19 - 32 mEq/L '24 27 29  '$ ?Calcium 8.4 - 10.5 mg/dL 9.5 9.7 9.8  ?Total Protein 6.0 - 8.3 g/dL 7.6 7.5 7.6  ?Total Bilirubin 0.2 - 1.2 mg/dL 0.4 0.7 0.5  ?Alkaline Phos 39 - 117 U/L 85 83 86  ?AST 0 - 37 U/L '22 20 20  '$ ?ALT 0 - 35 U/L '13 16 16  '$ ? ? ? ?PREVIOUS GI EVALUATIONS:  ? ? ?Imaging ? ? ?US Abdomen Complete ?CLINICAL DATA:  Abdominal distension and renal insufficiency. ? ?EXAM: ?ABDOMEN ULTRASOUND COMPLETE ? ?COMPARISON:  12/25/2017 ? ?FINDINGS: ?Gallbladder: Gallbladder is well distended. Gallstones are seen. No ?wall thickening or pericholecystic fluid is noted. ? ?Common bile duct: Diameter: 3.7 mm. ? ?Liver: Diffuse heterogeneity is noted without focal mass. Mild ?increased echogenicity is noted consistent with fatty infiltration. ?Portal vein is patent on color Doppler imaging with normal direction ?of blood flow towards the liver. ? ?IVC: No abnormality visualized. ? ?Pancreas: Visualized portion unremarkable. ? ?Spleen: Size and appearance within normal limits. ? ?Right Kidney: Length: 9 cm. 1.5 cm cyst is noted in the midportion ?of the right  kidney. No mass or hydronephrosis is noted. ? ?Left Kidney: Length: 8.2 cm. Echogenicity within normal limits. No ?mass or hydronephrosis visualized. ? ?Abdominal aorta: No aneurysm visualized. ? ?Other findings: None. ? ?IMPRESSION: ?Fatty infiltration of the liver. ? ?Cholelithiasis without complicating factors. ? ?Right renal cyst. ? ?Electronically Signed ?  By: Inez Catalina M.D. ?  On: 06/15/2021 02:51 ? ? ?Endoscopies:  ? ? ?Labs:  ? ?Past Medical History:  ?Diagnosis Date  ? Glaucoma   ? History of cervical dysplasia   ? CIN III  w Severe dysplasia  s/p  cervical cone bx 09/ 2014  ? History of colon polyps   ? hyperplastic 08/ 2011  ? Hyperlipidemia   ? diet controlled - no meds  ? Hypertension   ? Neuromuscular disorder ([place])   ? Osteopenia 12/2017  ? T score -1.7 FRAX 5.1% / 1.0%  ? Tics of organic origin   ? Dr. Doy Mince  ? VAIN II (vaginal intraepithelial neoplasia grade II)   ? Wears glasses   ? ? ? ?Past Surgical History:  ?Procedure Laterality Date  ? ABDOMINAL HYSTERECTOMY N/A 07/22/2014  ? Procedure: HYSTERECTOMY ABDOMINAL;  Surgeon: Terrance Mass, MD;  Location: [facility];  Service: Gynecology;  Laterality: N/A;  ? CATARACT EXTRACTION W/ INTRAOCULAR LENS IMPLANT Left 2014  ?  CERVICAL CONIZATION W/BX N/A 03/12/2013  ? Procedure: COLD KNIFE CONIZATION CERVIX WITH BIOPSY/EXAM UNDER ANESTHESIA, RECTAL EXAM;  Surgeon: Alvino Chapel, MD;  Location: [facility];  Service: Gynecology;  Laterality: N/A;  ? CERVICAL CONIZATION W/BX N/A 03/12/2013  ? Procedure: CONIZATION CERVIX WITH BIOPSY;  Surgeon: Alvino Chapel, MD;  Location: [facility];  Service: Gynecology;  Laterality: N/A;  patient returned to OR for fulgeration to stop bleeding  ? CO2 LASER APPLICATION N/A 1/61/0960  ? Procedure: CO2 LASER OF THE VAGINA;  Surgeon: [name], MD;  Location: [facility];  Service: Gynecology;  Laterality: N/A;  ? COLONOSCOPY  02-23-2010  ? DILATION AND  CURETTAGE OF UTERUS    ? SALPINGOOPHORECTOMY Bilateral 07/22/2014  ? Procedure: BILATERAL SALPINGO OOPHORECTOMY;  Surgeon: Terrance Mass, MD;  Location: [facility];  Service: Gynecology;  Laterality: Bilateral;  ? TUBAL LIGATION    ? ?Family History  ?Problem Relation Age of Onset  ? Hypertension Mother   ? Diabetes Father   ? Hypertension Father   ? Heart disease Maternal Aunt   ?     CHF  ? Ovarian cancer Sister   ? Colon cancer Neg Hx   ? Esophageal cancer Neg Hx   ? Pancreatic cancer Neg Hx   ? Stomach cancer Neg Hx   ? ?Social History  ? ?Tobacco Use  ? Smoking status: Never  ? Smokeless tobacco: Never  ?Vaping Use  ? Vaping Use: Never used  ?Substance Use Topics  ? Alcohol use: Yes  ?  Alcohol/week: 0.0 standard drinks  ?  Comment: rare; 1 beer per year  ? Drug use: No  ? ?Current Outpatient Medications  ?Medication Sig Dispense Refill  ? atenolol-chlorthalidone (TENORETIC) 100-25 MG tablet Take 1 tablet by mouth every morning. 90 tablet 3  ? Cholecalciferol (VITAMIN D3) 1000 units CAPS Take 1 capsule by mouth every morning.    ? losartan (COZAAR) 100 MG tablet TAKE 1 TABLET(100 MG) BY MOUTH EVERY MORNING 90 tablet 3  ? potassium chloride (KLOR-CON) 10 MEQ tablet Take 1 tablet (10 mEq total) by mouth 2 (two) times daily. 90 tablet 3  ? trihexyphenidyl (ARTANE) 2 MG tablet TAKE 1 AND 1/2 TABLETS BY MOUTH THREE TIMES DAILY. 410 tablet 1  ? latanoprost (XALATAN) 0.005 % ophthalmic solution Place 1 drop into both eyes at bedtime. (Patient not taking: Reported on 09/13/2021)    ? ?No current facility-administered medications for this visit.  ? ?Allergies  ?Allergen Reactions  ? Ace Inhibitors Other (See Comments)  ?  cough  ? Doxazosin Mesylate   ?  Unknown reaction  ? Verapamil Swelling  ?  REACTION: legs swelling, tired  ? ? ? ?Review of Systems: ?Positive for ***.  All other systems reviewed and negative except where noted in HPI.  ? ? ?PHYSICAL EXAM :   ? ?Wt Readings from Last 3 Encounters:  ?09/13/21 178 lb 12.8  oz (81.1 kg)  ?06/28/21 174 lb 9.6 oz (79.2 kg)  ?05/18/21 171 lb 3.2 oz (77.7 kg)  ? ? ?BP (!) 150/60   Pulse 65   Wt 178 lb 12.8 oz (81.1 kg)   SpO2 95%   BMI 29.30 kg/m?  ?Constitutional:  Generally well appearing ***female in no acute distress. ?Psychiatric: Pleasant. Normal mood and affect. Behavior is normal. ?EENT: Pupils normal.  Conjunctivae are normal. No scleral icterus. ?Neck supple.  ?Cardiovascular: Normal rate, regular rhythm. No edema ?Pulmonary/chest: Effort normal and breath sounds  normal. No wheezing, rales or rhonchi. ?Abdominal: Soft, nondistended, nontender. Bowel sounds active throughout. There are no masses palpable. No hepatomegaly. ?Neurological: Alert and oriented to person place and time. ?Skin: Skin is warm and dry. No rashes noted. ? ?Tye Savoy, NP  09/13/2021, 9:30 AM ? ?Cc:  ?Referring Provider ?Plotnikov, Evie Lacks, MD ? ? ? ? ? ? ? ?

## 2021-09-13 NOTE — Progress Notes (Signed)
? ? ? ?ASSESSMENT AND PLAN   ? ?# 81 yo female referred for history of colon polyps. Her last colonoscopy in 2011 was normal. A hyperplastic polyp was removed. She has no alarm symptoms. Given her age she doesn't require screening colonoscopies anymore and she is pleased to her that. ?--No plans for screening colonoscopy. She understands that changes in bowel habits or blood in stool would require her to get back in touch with [place].  ? ?# Chronic abdominal bloating.  ?--Two abdominal ultrasounds ( 2019, 2022) for abdominal distention were unrevealing.   ?--We discussed pathophysiology of bloating and that it is generally benign, not a sign of underlying pathology.  ?--A list of foods commonly associated with bloating was provided to patient.  Advised to omit one food item at a time for several days to see if there is any improvement in symptoms. If no improvement with omission of these food items ?--If symptoms do not improve will consider testing for SIBO.   ? ? ?HISTORY OF PRESENT ILLNESS   ? ? ?Chief Complaint : bloating.  ? ?ACCALIA RIGDON is a 81 y.o. female with a past medical history significant for cholelithiasis, CKD3, glaucoma, anxiety, HTN, TAH / BSO.  See PMH below for any additional history.  ? ?Ms Rhudy is referred by PCP for bloating and also history of colon polyps.  She had a polyp removed at time of colonoscopy in 2021 but it was hyperplastic. No [place] of colon cancer. No bowel changes, blood in stool, or abdominal pain. Hgb 13.6.  Patient has no complaints other than that of frequent bloating which is a chronic problem. Generally not bloated in am but gets bloated / distended as day progresses.  Feels like she looks pregnant at times. GasX sometimes helps. Drinking something hot helps pass flatus which helps. Her weight is up 12 pounds since June 2022.  ? ?She had an abdominal US in 2019 and another in Dec 2022 for abdominal distention. Findings included renal cysts and cholelithiasis. No  ascites.  ? ?CTAP w/ contrast June 2019 for abdominal pain showed cholelithiasis, probably small left adrenal adenoma. No acute findings.  ? ?Data Reviewed: ? ?CBC Latest Ref Rng & Units 05/19/2021 01/02/2018 12/20/2017  ?WBC 4.0 - 10.5 K/uL 6.0 4.4 5.1  ?Hemoglobin 12.0 - 15.0 g/dL 13.6 13.6 13.6  ?Hematocrit 36.0 - 46.0 % 41.0 43.0 39.8  ?Platelets 150.0 - 400.0 K/uL 272.0 279 319.0  ? ? ?Lab Results  ?Component Value Date  ? LIPASE 40 01/02/2018  ? ?CMP Latest Ref Rng & Units 05/18/2021 01/02/2018 08/04/2017  ?Glucose 70 - 99 mg/dL 87 101(H) 97  ?BUN 6 - 23 mg/dL '22 17 15  '$ ?Creatinine 0.40 - 1.20 mg/dL 1.22(H) 1.39(H) 1.18  ?Sodium 135 - 145 mEq/L 135 141 140  ?Potassium 3.5 - 5.1 mEq/L 3.4(L) 3.5 4.1  ?Chloride 96 - 112 mEq/L 101 105 101  ?CO2 19 - 32 mEq/L '24 27 29  '$ ?Calcium 8.4 - 10.5 mg/dL 9.5 9.7 9.8  ?Total Protein 6.0 - 8.3 g/dL 7.6 7.5 7.6  ?Total Bilirubin 0.2 - 1.2 mg/dL 0.4 0.7 0.5  ?Alkaline Phos 39 - 117 U/L 85 83 86  ?AST 0 - 37 U/L '22 20 20  '$ ?ALT 0 - 35 U/L '13 16 16  '$ ? ? ? ? ?PREVIOUS GI EVALUATIONS:  ? ?Imaging:  ?Abdominal US in 2019 and Dec 2022 for abdominal distention.  ?--renal cysts and cholelithiasis. No ascites.  ? ?CTAP w/ contrast June 2019  for abdominal pain ?--cholelithiasis, probably small left adrenal adenoma. No acute findings.  ? ?Endoscopies:  ?Aug 2011 Colonoscopy  ?--5 mm polyp which was hyperplastic ? ? ? ?Past Medical History:  ?Diagnosis Date  ? Glaucoma   ? History of cervical dysplasia   ? CIN III  w Severe dysplasia  s/p  cervical cone bx 09/ 2014  ? History of colon polyps   ? hyperplastic 08/ 2011  ? Hyperlipidemia   ? diet controlled - no meds  ? Hypertension   ? Neuromuscular disorder ([place])   ? Osteopenia 12/2017  ? T score -1.7 FRAX 5.1% / 1.0%  ? Tics of organic origin   ? Dr. Doy Mince  ? VAIN II (vaginal intraepithelial neoplasia grade II)   ? Wears glasses   ? ? ? ?Past Surgical History:  ?Procedure Laterality Date  ? ABDOMINAL HYSTERECTOMY N/A 07/22/2014  ? Procedure:  HYSTERECTOMY ABDOMINAL;  Surgeon: Terrance Mass, MD;  Location: [facility];  Service: Gynecology;  Laterality: N/A;  ? CATARACT EXTRACTION W/ INTRAOCULAR LENS IMPLANT Left 2014  ? CERVICAL CONIZATION W/BX N/A 03/12/2013  ? Procedure: COLD KNIFE CONIZATION CERVIX WITH BIOPSY/EXAM UNDER ANESTHESIA, RECTAL EXAM;  Surgeon: Alvino Chapel, MD;  Location: [facility];  Service: Gynecology;  Laterality: N/A;  ? CERVICAL CONIZATION W/BX N/A 03/12/2013  ? Procedure: CONIZATION CERVIX WITH BIOPSY;  Surgeon: Alvino Chapel, MD;  Location: [facility];  Service: Gynecology;  Laterality: N/A;  patient returned to OR for fulgeration to stop bleeding  ? CO2 LASER APPLICATION N/A 10/17/8117  ? Procedure: CO2 LASER OF THE VAGINA;  Surgeon: [name], MD;  Location: [facility];  Service: Gynecology;  Laterality: N/A;  ? COLONOSCOPY  02-23-2010  ? DILATION AND CURETTAGE OF UTERUS    ? SALPINGOOPHORECTOMY Bilateral 07/22/2014  ? Procedure: BILATERAL SALPINGO OOPHORECTOMY;  Surgeon: Terrance Mass, MD;  Location: [facility];  Service: Gynecology;  Laterality: Bilateral;  ? TUBAL LIGATION    ? ?Family History  ?Problem Relation Age of Onset  ? Hypertension Mother   ? Diabetes Father   ? Hypertension Father   ? Heart disease Maternal Aunt   ?     CHF  ? Ovarian cancer Sister   ? Colon cancer Neg Hx   ? Esophageal cancer Neg Hx   ? Pancreatic cancer Neg Hx   ? Stomach cancer Neg Hx   ? ?Social History  ? ?Tobacco Use  ? Smoking status: Never  ? Smokeless tobacco: Never  ?Vaping Use  ? Vaping Use: Never used  ?Substance Use Topics  ? Alcohol use: Yes  ?  Alcohol/week: 0.0 standard drinks  ?  Comment: rare; 1 beer per year  ? Drug use: No  ? ?Current Outpatient Medications  ?Medication Sig Dispense Refill  ? atenolol-chlorthalidone (TENORETIC) 100-25 MG tablet Take 1 tablet by mouth every morning. 90 tablet 3  ? Cholecalciferol (VITAMIN D3) 1000 units CAPS Take 1 capsule by mouth every  morning.    ? losartan (COZAAR) 100 MG tablet TAKE 1 TABLET(100 MG) BY MOUTH EVERY MORNING 90 tablet 3  ? potassium chloride (KLOR-CON) 10 MEQ tablet Take 1 tablet (10 mEq total) by mouth 2 (two) times daily. 90 tablet 3  ? trihexyphenidyl (ARTANE) 2 MG tablet TAKE 1 AND 1/2 TABLETS BY MOUTH THREE TIMES DAILY. 410 tablet 1  ? latanoprost (XALATAN) 0.005 % ophthalmic solution Place 1 drop into both eyes at bedtime. (Patient not taking: Reported on 09/13/2021)    ? ?  No current facility-administered medications for this visit.  ? ?Allergies  ?Allergen Reactions  ? Ace Inhibitors Other (See Comments)  ?  cough  ? Doxazosin Mesylate   ?  Unknown reaction  ? Verapamil Swelling  ?  REACTION: legs swelling, tired  ? ? ? ?Review of Systems: ?Positive for anxiety, vision changes.  All other systems reviewed and negative except where noted in HPI.  ? ? ?PHYSICAL EXAM :   ? ?Wt Readings from Last 3 Encounters:  ?09/13/21 178 lb 12.8 oz (81.1 kg)  ?06/28/21 174 lb 9.6 oz (79.2 kg)  ?05/18/21 171 lb 3.2 oz (77.7 kg)  ? ? ?BP (!) 150/60   Pulse 65   Wt 178 lb 12.8 oz (81.1 kg)   SpO2 95%   BMI 29.30 kg/m?  ?Constitutional:  Generally well appearing female in no acute distress. ?Psychiatric: Pleasant. Normal mood and affect. Behavior is normal. ?EENT: Pupils normal.  Conjunctivae are normal. No scleral icterus. ?Neck supple.  ?Cardiovascular: Normal rate, regular rhythm. No edema ?Pulmonary/chest: Effort normal and breath sounds normal. No wheezing, rales or rhonchi. ?Abdominal: Soft, nondistended, nontender. Bowel sounds active throughout. There are no masses palpable. No hepatomegaly. ?Neurological: Alert and oriented to person place and time. ?Skin: Skin is warm and dry. No rashes noted. ? ?Tye Savoy, NP  09/13/2021, 9:37 AM ? ?Cc:  ?Referring Provider ?Plotnikov, Evie Lacks, MD ? ? ? ? ? ? ? ?

## 2021-09-13 NOTE — Patient Instructions (Addendum)
Bloating ? ?It can take time to figure out which foods may be contributing to your symptoms. Will provide a list of the more common problematic foods. Review the list.  If you consume any of these foods on a regular basis then try omitting then one by one for a several days to see if bloating / gas improve.  ? ?  ?Please see low fodmap handout.  ? ?Follow up as needed. ? ?Thank you for choosing me and [facility]. ? ? ? ?

## 2021-10-05 NOTE — Progress Notes (Signed)
Reviewed and agree with documentation and assessment and plan. K. Veena Rayford Williamsen , MD   

## 2021-11-08 ENCOUNTER — Other Ambulatory Visit: Payer: Self-pay | Admitting: Internal Medicine

## 2021-12-16 ENCOUNTER — Ambulatory Visit (INDEPENDENT_AMBULATORY_CARE_PROVIDER_SITE_OTHER): Payer: Medicare HMO

## 2021-12-16 DIAGNOSIS — Z0000 Encounter for general adult medical examination without abnormal findings: Secondary | ICD-10-CM | POA: Diagnosis not present

## 2021-12-16 NOTE — Patient Instructions (Signed)
Laurie Pratt , Thank you for taking time to come for your Medicare Wellness Visit. I appreciate your ongoing commitment to your health goals. Please review the following plan we discussed and let me know if I can assist you in the future.   Screening recommendations/referrals: Colonoscopy: Not a candidate for screening due to age 81: Not a candidate for screening due to age Bone Density: Not a candidate for screening due to age Recommended yearly ophthalmology/optometry visit for glaucoma screening and checkup Recommended yearly dental visit for hygiene and checkup  Vaccinations: Influenza vaccine: declined Pneumococcal vaccine: declined Tdap vaccine: declined Shingles vaccine: declined   Covid-19: 02/17/2020, 03/09/2020  Advanced directives: Yes; Please bring a copy of your health care power of attorney and living will to the office at your convenience.  Conditions/risks identified: Yes  Next appointment: 12/19/2022 at 8:45 a.m. telephone visit with Mignon Pine, Nurse Health Advisor.  If you need to reschedule or cancel, please call [phone].   Preventive Care 14 Years and Older, Female Preventive care refers to lifestyle choices and visits with your health care provider that can promote health and wellness. What does preventive care include? A yearly physical exam. This is also called an annual well check. Dental exams once or twice a year. Routine eye exams. Ask your health care provider how often you should have your eyes checked. Personal lifestyle choices, including: Daily care of your teeth and gums. Regular physical activity. Eating a healthy diet. Avoiding tobacco and drug use. Limiting alcohol use. Practicing safe sex. Taking low-dose aspirin every day. Taking vitamin and mineral supplements as recommended by your health care provider. What happens during an annual well check? The services and screenings done by your health care provider during your annual well  check will depend on your age, overall health, lifestyle risk factors, and family history of disease. Counseling  Your health care provider may ask you questions about your: Alcohol use. Tobacco use. Drug use. Emotional well-being. Home and relationship well-being. Sexual activity. Eating habits. History of falls. Memory and ability to understand (cognition). Work and work Statistician. Reproductive health. Screening  You may have the following tests or measurements: Height, weight, and BMI. Blood pressure. Lipid and cholesterol levels. These may be checked every 5 years, or more frequently if you are over 82 years old. Skin check. Lung cancer screening. You may have this screening every year starting at age 59 if you have a 30-pack-year history of smoking and currently smoke or have quit within the past 15 years. Fecal occult blood test (FOBT) of the stool. You may have this test every year starting at age 69. Flexible sigmoidoscopy or colonoscopy. You may have a sigmoidoscopy every 5 years or a colonoscopy every 10 years starting at age 34. Hepatitis C blood test. Hepatitis B blood test. Sexually transmitted disease (STD) testing. Diabetes screening. This is done by checking your blood sugar (glucose) after you have not eaten for a while (fasting). You may have this done every 1-3 years. Bone density scan. This is done to screen for osteoporosis. You may have this done starting at age 37. Mammogram. This may be done every 1-2 years. Talk to your health care provider about how often you should have regular mammograms. Talk with your health care provider about your test results, treatment options, and if necessary, the need for more tests. Vaccines  Your health care provider may recommend certain vaccines, such as: Influenza vaccine. This is recommended every year. Tetanus, diphtheria, and acellular pertussis (Tdap, Td)  vaccine. You may need a Td booster every 10 years. Zoster  vaccine. You may need this after age 68. Pneumococcal 13-valent conjugate (PCV13) vaccine. One dose is recommended after age 65. Pneumococcal polysaccharide (PPSV23) vaccine. One dose is recommended after age 31. Talk to your health care provider about which screenings and vaccines you need and how often you need them. This information is not intended to replace advice given to you by your health care provider. Make sure you discuss any questions you have with your health care provider. Document Released: 07/24/2015 Document Revised: 03/16/2016 Document Reviewed: 04/28/2015 Elsevier Interactive Patient Education  2017 [place] Prevention in the Home Falls can cause injuries. They can happen to people of all ages. There are many things you can do to make your home safe and to help prevent falls. What can I do on the outside of my home? Regularly fix the edges of walkways and driveways and fix any cracks. Remove anything that might make you trip as you walk through a door, such as a raised step or threshold. Trim any bushes or trees on the path to your home. Use bright outdoor lighting. Clear any walking paths of anything that might make someone trip, such as rocks or tools. Regularly check to see if handrails are loose or broken. Make sure that both sides of any steps have handrails. Any raised decks and porches should have guardrails on the edges. Have any leaves, snow, or ice cleared regularly. Use sand or salt on walking paths during winter. Clean up any spills in your garage right away. This includes oil or grease spills. What can I do in the bathroom? Use night lights. Install grab bars by the toilet and in the tub and shower. Do not use towel bars as grab bars. Use non-skid mats or decals in the tub or shower. If you need to sit down in the shower, use a plastic, non-slip stool. Keep the floor dry. Clean up any water that spills on the floor as soon as it happens. Remove  soap buildup in the tub or shower regularly. Attach bath mats securely with double-sided non-slip rug tape. Do not have throw rugs and other things on the floor that can make you trip. What can I do in the bedroom? Use night lights. Make sure that you have a light by your bed that is easy to reach. Do not use any sheets or blankets that are too big for your bed. They should not hang down onto the floor. Have a firm chair that has side arms. You can use this for support while you get dressed. Do not have throw rugs and other things on the floor that can make you trip. What can I do in the kitchen? Clean up any spills right away. Avoid walking on wet floors. Keep items that you use a lot in easy-to-reach places. If you need to reach something above you, use a strong step stool that has a grab bar. Keep electrical cords out of the way. Do not use floor polish or wax that makes floors slippery. If you must use wax, use non-skid floor wax. Do not have throw rugs and other things on the floor that can make you trip. What can I do with my stairs? Do not leave any items on the stairs. Make sure that there are handrails on both sides of the stairs and use them. Fix handrails that are broken or loose. Make sure that handrails are as long  as the stairways. Check any carpeting to make sure that it is firmly attached to the stairs. Fix any carpet that is loose or worn. Avoid having throw rugs at the top or bottom of the stairs. If you do have throw rugs, attach them to the floor with carpet tape. Make sure that you have a light switch at the top of the stairs and the bottom of the stairs. If you do not have them, ask someone to add them for you. What else can I do to help prevent falls? Wear shoes that: Do not have high heels. Have rubber bottoms. Are comfortable and fit you well. Are closed at the toe. Do not wear sandals. If you use a stepladder: Make sure that it is fully opened. Do not climb a  closed stepladder. Make sure that both sides of the stepladder are locked into place. Ask someone to hold it for you, if possible. Clearly mark and make sure that you can see: Any grab bars or handrails. First and last steps. Where the edge of each step is. Use tools that help you move around (mobility aids) if they are needed. These include: Canes. Walkers. Scooters. Crutches. Turn on the lights when you go into a dark area. Replace any light bulbs as soon as they burn out. Set up your furniture so you have a clear path. Avoid moving your furniture around. If any of your floors are uneven, fix them. If there are any pets around you, be aware of where they are. Review your medicines with your doctor. Some medicines can make you feel dizzy. This can increase your chance of falling. Ask your doctor what other things that you can do to help prevent falls. This information is not intended to replace advice given to you by your health care provider. Make sure you discuss any questions you have with your health care provider. Document Released: 04/23/2009 Document Revised: 12/03/2015 Document Reviewed: 08/01/2014 Elsevier Interactive Patient Education  2017 [employer].

## 2021-12-16 NOTE — Progress Notes (Cosign Needed Addendum)
I connected with Laurie Pratt today by telephone and verified that I am speaking with the correct person using two identifiers. Location patient: home Location provider: work Persons participating in the virtual visit: patient, provider.   I discussed the limitations, risks, security and privacy concerns of performing an evaluation and management service by telephone and the availability of in person appointments. I also discussed with the patient that there may be a patient responsible charge related to this service. The patient expressed understanding and verbally consented to this telephonic visit.    Interactive audio and video telecommunications were attempted between this provider and patient, however failed, due to patient having technical difficulties OR patient did not have access to video capability.  We continued and completed visit with audio only.  Some vital signs may be absent or patient reported.   Time Spent with patient on telephone encounter: 30 minutes  Subjective:   Laurie Pratt is a 81 y.o. female who presents for Medicare Annual (Subsequent) preventive examination.  Review of Systems     Cardiac Risk Factors include: advanced age (>3mn, >>12women);dyslipidemia;family history of premature cardiovascular disease;hypertension     Objective:    There were no vitals filed for this visit. There is no height or weight on file to calculate BMI.     12/16/2021    2:19 PM 12/14/2020   12:46 PM 12/31/2015   11:05 AM 07/22/2014    5:21 PM 07/18/2014    9:43 AM 03/12/2013    7:09 AM 03/07/2013   11:03 AM  Advanced Directives  Does Patient Have a Medical Advance Directive? Yes No Yes No No Patient has advance directive, copy not in chart Patient has advance directive, copy not in chart  Type of Advance Directive Living will;[place]care [place]   Living will Living will  Does patient want to make changes to medical advance directive? No - Patient  declined No - Patient declined No - Patient declined      Copy of [place] Chart? No - copy requested     Copy requested from family Copy requested from family  Would patient like information on creating a medical advance directive?    No - patient declined information No - patient declined information    Pre-existing out of facility DNR order (yellow form or pink MOST form)      No No    Current Medications (verified) Outpatient Encounter Medications as of 12/16/2021  Medication Sig   atenolol-chlorthalidone (TENORETIC) 100-25 MG tablet TAKE 1 TABLET BY MOUTH EVERY MORNING   Cholecalciferol (VITAMIN D3) 1000 units CAPS Take 1 capsule by mouth every morning.   losartan (COZAAR) 100 MG tablet TAKE 1 TABLET(100 MG) BY MOUTH EVERY MORNING   potassium chloride (KLOR-CON) 10 MEQ tablet Take 1 tablet (10 mEq total) by mouth 2 (two) times daily.   trihexyphenidyl (ARTANE) 2 MG tablet TAKE 1 AND 1/2 TABLETS BY MOUTH THREE TIMES DAILY.   latanoprost (XALATAN) 0.005 % ophthalmic solution Place 1 drop into both eyes at bedtime. (Patient not taking: Reported on 09/13/2021)   No facility-administered encounter medications on file as of 12/16/2021.    Allergies (verified) Ace inhibitors, Doxazosin mesylate, and Verapamil   History: Past Medical History:  Diagnosis Date   Glaucoma    History of cervical dysplasia    CIN III  w Severe dysplasia  s/p  cervical cone bx 09/ 2014   History of colon polyps  hyperplastic 08/ 2011   Hyperlipidemia    diet controlled - no meds   Hypertension    Neuromuscular disorder ([place])    Osteopenia 12/2017   T score -1.7 FRAX 5.1% / 1.0%   Tics of organic origin    Dr. Doy Mince   VAIN II (vaginal intraepithelial neoplasia grade II)    Wears glasses    Past Surgical History:  Procedure Laterality Date   ABDOMINAL HYSTERECTOMY N/A 07/22/2014   Procedure: HYSTERECTOMY ABDOMINAL;  Surgeon: Terrance Mass, MD;  Location: [facility];  Service:  Gynecology;  Laterality: N/A;   CATARACT EXTRACTION W/ INTRAOCULAR LENS IMPLANT Left 2014   CERVICAL CONIZATION W/BX N/A 03/12/2013   Procedure: COLD KNIFE CONIZATION CERVIX WITH BIOPSY/EXAM UNDER ANESTHESIA, RECTAL EXAM;  Surgeon: Alvino Chapel, MD;  Location: [facility];  Service: Gynecology;  Laterality: N/A;   CERVICAL CONIZATION W/BX N/A 03/12/2013   Procedure: CONIZATION CERVIX WITH BIOPSY;  Surgeon: Alvino Chapel, MD;  Location: [facility];  Service: Gynecology;  Laterality: N/A;  patient returned to OR for fulgeration to stop bleeding   CO2 LASER APPLICATION N/A 11/10/7739   Procedure: CO2 LASER OF THE VAGINA;  Surgeon: [name], MD;  Location: [facility];  Service: Gynecology;  Laterality: N/A;   COLONOSCOPY  02-23-2010   DILATION AND CURETTAGE OF UTERUS     SALPINGOOPHORECTOMY Bilateral 07/22/2014   Procedure: BILATERAL SALPINGO OOPHORECTOMY;  Surgeon: Terrance Mass, MD;  Location: [facility];  Service: Gynecology;  Laterality: Bilateral;   TUBAL LIGATION     Family History  Problem Relation Age of Onset   Hypertension Mother    Diabetes Father    Hypertension Father    Heart disease Maternal Aunt        CHF   Ovarian cancer Sister    Colon cancer Neg Hx    Esophageal cancer Neg Hx    Pancreatic cancer Neg Hx    Stomach cancer Neg Hx    Social History   Socioeconomic History   Marital status: Widowed    Spouse name: Not on file   Number of children: 2   Years of education: 1   Highest education level: Not on file  Occupational History   Occupation: retired   Tobacco Use   Smoking status: Never   Smokeless tobacco: Never  Vaping Use   Vaping Use: Never used  Substance and Sexual Activity   Alcohol use: Yes    Alcohol/week: 0.0 standard drinks of alcohol    Comment: rare; 1 beer per year   Drug use: No   Sexual activity: Not Currently    Comment: 1st intercourse- 19, partners- 2, widow  Other  Topics Concern   Not on file  Social History Narrative   Patient is widowed with 2 children   Patient is right handed   Patient has a high school education   Patient drinks 1 cup daily   Social Determinants of Health   Financial Resource Strain: Low Risk  (12/16/2021)   Overall Financial Resource Strain (CARDIA)    Difficulty of Paying Living Expenses: Not hard at all  Food Insecurity: No Food Insecurity (12/16/2021)   Hunger Vital Sign    Worried About Running Out of Food in the Last Year: Never true    Ran Out of Food in the Last Year: Never true  Transportation Needs: No Transportation Needs (12/16/2021)   PRAPARE - Hydrologist (Medical): No  Lack of Transportation (Non-Medical): No  Physical Activity: Sufficiently Active (12/16/2021)   Exercise Vital Sign    Days of Exercise per Week: 5 days    Minutes of Exercise per Session: 30 min  Stress: No Stress Concern Present (12/16/2021)   [place]    Feeling of Stress : Only a little  Social Connections: Moderately Integrated (12/16/2021)   Social Connection and Isolation Panel [NHANES]    Frequency of Communication with Friends and Family: More than three times a week    Frequency of Social Gatherings with Friends and Family: More than three times a week    Attends Religious Services: More than 4 times per year    Active Member of [employer] or Organizations: Yes    Attends Archivist Meetings: More than 4 times per year    Marital Status: Widowed    Tobacco Counseling Counseling given: Not Answered   Clinical Intake:  Pre-visit preparation completed: Yes  Pain : No/denies pain     BMI - recorded: 29.3 Nutritional Status: BMI 25 -29 Overweight Nutritional Risks: None Diabetes: No  How often do you need to have someone help you when you read instructions, pamphlets, or other written materials from your doctor or pharmacy?:  1 - Never What is the last grade level you completed in school?: HSG  Diabetic? no  Interpreter Needed?: No  Information entered by :: Lisette Abu, LPN.   Activities of Daily Living    12/16/2021    2:27 PM  In your present state of health, do you have any difficulty performing the following activities:  Hearing? 0  Vision? 0  Difficulty concentrating or making decisions? 0  Walking or climbing stairs? 0  Dressing or bathing? 0  Doing errands, shopping? 0  Preparing Food and eating ? N  Using the Toilet? N  In the past six months, have you accidently leaked urine? N  Do you have problems with loss of bowel control? N  Managing your Medications? N  Managing your Finances? N  Housekeeping or managing your Housekeeping? N    Patient Care Team: Plotnikov, Evie Lacks, MD as PCP - General Olevia Perches Lowella Bandy, MD (Inactive) (Gastroenterology) Clent Jacks, MD as Consulting Physician (Ophthalmology)  Indicate any recent Medical Services you may have received from other than [facility] providers in the past year (date may be approximate).     Assessment:   This is a routine wellness examination for [place].  Hearing/Vision screen Hearing Screening - Comments:: Patient denied any hearing difficulty.   No hearing aids.  Vision Screening - Comments:: Patient does wear corrective lenses/contacts.  Eye exam done by: Dr. Clent Jacks   Dietary issues and exercise activities discussed: Current Exercise Habits: Home exercise routine, Type of exercise: walking, Time (Minutes): 30, Frequency (Times/Week): 5, Weekly Exercise (Minutes/Week): 150, Intensity: Mild, Exercise limited by: neurologic condition(s)   Goals Addressed             This Visit's Progress    To maintain my health, stay independent, physically and socially active.        Depression Screen    12/16/2021    2:22 PM 05/18/2021    9:53 AM 12/14/2020   12:45 PM 11/16/2020    1:27 PM 09/23/2019    3:11 PM 08/04/2017    9:51  AM 08/03/2016   10:16 AM  PHQ 2/9 Scores  PHQ - 2 Score 0 0 0 0 0 0 0  PHQ-  9 Score  0 0 0       Fall Risk    12/16/2021    2:20 PM 05/18/2021    9:53 AM 12/14/2020   12:46 PM 11/16/2020    1:27 PM 09/23/2019    3:11 PM  Fall Risk   Falls in the past year? 0 0 1 1 0  Number falls in past yr: 0 0 0 0   Injury with Fall? 0 0 0 0   Risk for fall due to : No Fall Risks No Fall Risks History of fall(s) History of fall(s)   Follow up Falls evaluation completed  Falls evaluation completed  Falls evaluation completed    [place]:  Any stairs in or around the home? No  If so, are there any without handrails? No  Home free of loose throw rugs in walkways, pet beds, electrical cords, etc? Yes  Adequate lighting in your home to reduce risk of falls? Yes   ASSISTIVE DEVICES UTILIZED TO PREVENT FALLS:  Life alert? No  Use of a cane, walker or w/c? No  Grab bars in the bathroom? No  Shower chair or bench in shower? No  Elevated toilet seat or a handicapped toilet? No   TIMED UP AND GO:  Was the test performed? No .  Length of time to ambulate 10 feet: n/a sec.   Appearance of gait: Patient not evaluated for gait during this visit.  Cognitive Function:        12/16/2021    2:28 PM  6CIT Screen  What Year? 0 points  What month? 0 points  What time? 0 points  Count back from 20 0 points  Months in reverse 0 points  Repeat phrase 0 points  Total Score 0 points    Immunizations Immunization History  Administered Date(s) Administered   PFIZER(Purple Top)SARS-COV-2 Vaccination 02/17/2020, 03/09/2020    TDAP status: Due, Education has been provided regarding the importance of this vaccine. Advised may receive this vaccine at local pharmacy or Health Dept. Aware to provide a copy of the vaccination record if obtained from local pharmacy or Health Dept. Verbalized acceptance and understanding.  Flu Vaccine status: Declined, Education has been  provided regarding the importance of this vaccine but patient still declined. Advised may receive this vaccine at local pharmacy or Health Dept. Aware to provide a copy of the vaccination record if obtained from local pharmacy or Health Dept. Verbalized acceptance and understanding.  Pneumococcal vaccine status: Declined,  Education has been provided regarding the importance of this vaccine but patient still declined. Advised may receive this vaccine at local pharmacy or Health Dept. Aware to provide a copy of the vaccination record if obtained from local pharmacy or Health Dept. Verbalized acceptance and understanding.   Covid-19 vaccine status: Completed vaccines  Qualifies for Shingles Vaccine? Yes   Zostavax completed No   Shingrix Completed?: No.    Education has been provided regarding the importance of this vaccine. Patient has been advised to call insurance company to determine out of pocket expense if they have not yet received this vaccine. Advised may also receive vaccine at local pharmacy or Health Dept. Verbalized acceptance and understanding.  Screening Tests Health Maintenance  Topic Date Due   Zoster Vaccines- Shingrix (1 of 2) Never done   Pneumonia Vaccine 34+ Years old (1 - PCV) Never done   TETANUS/TDAP  07/12/2015   COVID-19 Vaccine (3 - Pfizer series) 05/04/2020   DEXA SCAN  Completed   Hepatitis C Screening  Completed   HPV VACCINES  Aged Out   INFLUENZA VACCINE  Discontinued   COLONOSCOPY (Pts 45-81yr Insurance coverage will need to be confirmed)  Discontinued    Health Maintenance  Health Maintenance Due  Topic Date Due   Zoster Vaccines- Shingrix (1 of 2) Never done   Pneumonia Vaccine 79 Years old (1 - PCV) Never done   TETANUS/TDAP  07/12/2015   COVID-19 Vaccine (3 - Pfizer series) 05/04/2020    Colorectal cancer screening: No longer required.   Mammogram status: No longer required due to refusal.  Bone Density status: Completed 12/12/2017. Results  reflect: Bone density results: OSTEOPENIA. Repeat every 2-3 years.  Lung Cancer Screening: (Low Dose CT Chest recommended if Age 175-80years, 30 pack-year currently smoking OR have quit w/in 15years.) does not qualify.   Lung Cancer Screening Referral: no  Additional Screening:  Hepatitis C Screening: does qualify; Completed 07/08/2015  Vision Screening: Recommended annual ophthalmology exams for early detection of glaucoma and other disorders of the eye. Is the patient up to date with their annual eye exam?  Yes  Who is the provider or what is the name of the office in which the patient attends annual eye exams? RClent Jacks MD. If pt is not established with a provider, would they like to be referred to a provider to establish care? No .   Dental Screening: Recommended annual dental exams for proper oral hygiene  Community Resource Referral / Chronic Care Management: CRR required this visit?  No   CCM required this visit?  No      Plan:     I have personally reviewed and noted the following in the patient's chart:   Medical and social history Use of alcohol, tobacco or illicit drugs  Current medications and supplements including opioid prescriptions.  Functional ability and status Nutritional status Physical activity Advanced directives List of other physicians Hospitalizations, surgeries, and ER visits in previous 12 months Vitals Screenings to include cognitive, depression, and falls Referrals and appointments  In addition, I have reviewed and discussed with patient certain preventive protocols, quality metrics, and best practice recommendations. A written personalized care plan for preventive services as well as general preventive health recommendations were provided to patient.     SSheral Flow LPN   65/0/2774  Nurse Notes:  There were no vitals filed for this visit. There is no height or weight on file to calculate BMI. Patient stated that she has no  issues with gait or balance; does not use any assistive devices. Medications reviewed with patient; no opioid use noted.  Medical screening examination/treatment/procedure(s) were performed by non-physician practitioner and as supervising physician I was immediately available for consultation/collaboration.  I agree with above. ALew Dawes MD

## 2021-12-27 ENCOUNTER — Ambulatory Visit (INDEPENDENT_AMBULATORY_CARE_PROVIDER_SITE_OTHER): Payer: Medicare HMO | Admitting: Internal Medicine

## 2021-12-27 ENCOUNTER — Encounter: Payer: Self-pay | Admitting: Internal Medicine

## 2021-12-27 VITALS — BP 140/82 | HR 67 | Temp 98.0°F | Ht 65.5 in | Wt 173.0 lb

## 2021-12-27 DIAGNOSIS — E785 Hyperlipidemia, unspecified: Secondary | ICD-10-CM | POA: Diagnosis not present

## 2021-12-27 DIAGNOSIS — N183 Chronic kidney disease, stage 3 unspecified: Secondary | ICD-10-CM

## 2021-12-27 DIAGNOSIS — K8020 Calculus of gallbladder without cholecystitis without obstruction: Secondary | ICD-10-CM

## 2021-12-27 DIAGNOSIS — Z860100 Personal history of colon polyps, unspecified: Secondary | ICD-10-CM

## 2021-12-27 DIAGNOSIS — I10 Essential (primary) hypertension: Secondary | ICD-10-CM

## 2021-12-27 DIAGNOSIS — Z1211 Encounter for screening for malignant neoplasm of colon: Secondary | ICD-10-CM | POA: Diagnosis not present

## 2021-12-27 DIAGNOSIS — G245 Blepharospasm: Secondary | ICD-10-CM

## 2021-12-27 LAB — COMPREHENSIVE METABOLIC PANEL
ALT: 12 U/L (ref 0–35)
AST: 19 U/L (ref 0–37)
Albumin: 3.9 g/dL (ref 3.5–5.2)
Alkaline Phosphatase: 76 U/L (ref 39–117)
BUN: 24 mg/dL — ABNORMAL HIGH (ref 6–23)
CO2: 27 mEq/L (ref 19–32)
Calcium: 9.9 mg/dL (ref 8.4–10.5)
Chloride: 104 mEq/L (ref 96–112)
Creatinine, Ser: 1.73 mg/dL — ABNORMAL HIGH (ref 0.40–1.20)
GFR: 27.96 mL/min — ABNORMAL LOW (ref >60.00)
Glucose, Bld: 97 mg/dL (ref 70–99)
Potassium: 3.9 mEq/L (ref 3.5–5.1)
Sodium: 138 mEq/L (ref 135–145)
Total Bilirubin: 0.5 mg/dL (ref 0.2–1.2)
Total Protein: 7.4 g/dL (ref 6.0–8.3)

## 2021-12-27 MED ORDER — TRIHEXYPHENIDYL HCL 2 MG PO TABS: ORAL_TABLET | ORAL | 1 refills | Status: DC

## 2021-12-27 MED ORDER — POTASSIUM CHLORIDE CRYS ER 10 MEQ PO TBCR: 10.0000 meq | EXTENDED_RELEASE_TABLET | Freq: Two times a day (BID) | ORAL | 3 refills | Status: DC

## 2021-12-27 MED ORDER — ATENOLOL-CHLORTHALIDONE 100-25 MG PO TABS: 1.0000 | ORAL_TABLET | Freq: Every morning | ORAL | 3 refills | Status: DC

## 2021-12-27 MED ORDER — LOSARTAN POTASSIUM 100 MG PO TABS: ORAL_TABLET | ORAL | 3 refills | Status: DC

## 2021-12-27 NOTE — Assessment & Plan Note (Signed)
Stage 3-4 - worse GFR is 44 2022.  Renal ultrasound - ok in 2023. GFR 27 in 6/23 GFR - monitoring Needs to hydrate well. D/c HCTZ Nephrology consultation

## 2021-12-27 NOTE — Assessment & Plan Note (Signed)
F/u w/Dr Yan 

## 2021-12-27 NOTE — Assessment & Plan Note (Signed)
Last colon 2011 - Dr Olevia Perches - one hyperplastic polyp, rec. repeat in 7-10 years We can order Cologuard

## 2021-12-27 NOTE — Progress Notes (Signed)
Subjective:  Patient ID: Laurie Pratt, female    DOB: 01-Sep-1940  Age: 81 y.o. MRN: [id number]  CC: No chief complaint on file.   HPI Laurie Pratt presents for HTN, blepharospasm, dyslipidemia  Outpatient Medications Prior to Visit  Medication Sig Dispense Refill   Cholecalciferol (VITAMIN D3) 1000 units CAPS Take 1 capsule by mouth every morning.     atenolol-chlorthalidone (TENORETIC) 100-25 MG tablet TAKE 1 TABLET BY MOUTH EVERY MORNING 90 tablet 2   losartan (COZAAR) 100 MG tablet TAKE 1 TABLET(100 MG) BY MOUTH EVERY MORNING 90 tablet 3   potassium chloride (KLOR-CON) 10 MEQ tablet Take 1 tablet (10 mEq total) by mouth 2 (two) times daily. 90 tablet 3   trihexyphenidyl (ARTANE) 2 MG tablet TAKE 1 AND 1/2 TABLETS BY MOUTH THREE TIMES DAILY. 410 tablet 1   latanoprost (XALATAN) 0.005 % ophthalmic solution Place 1 drop into both eyes at bedtime. (Patient not taking: Reported on 09/13/2021)     No facility-administered medications prior to visit.    ROS: Review of Systems  Constitutional:  Negative for activity change, appetite change, chills, fatigue and unexpected weight change.  HENT:  Negative for congestion, mouth sores and sinus pressure.   Eyes:  Negative for visual disturbance.  Respiratory:  Negative for cough and chest tightness.   Gastrointestinal:  Negative for abdominal pain and nausea.  Genitourinary:  Negative for difficulty urinating, frequency and vaginal pain.  Musculoskeletal:  Negative for back pain and gait problem.  Skin:  Negative for pallor and rash.  Neurological:  Positive for tremors. Negative for dizziness, weakness, numbness and headaches.  Psychiatric/Behavioral:  Negative for confusion and sleep disturbance.   Face twitching  Objective:  BP 140/82 (BP Location: Left Arm, Patient Position: Sitting, Cuff Size: Normal)   Pulse 67   Temp 98 F (36.7 C) (Oral)   Ht 5' 5.5" (1.664 m)   Wt 173 lb (78.5 kg)   SpO2 96%   BMI 28.35 kg/m   BP  Readings from Last 3 Encounters:  12/27/21 140/82  09/13/21 (!) 150/60  06/28/21 (!) 152/72    Wt Readings from Last 3 Encounters:  12/27/21 173 lb (78.5 kg)  09/13/21 178 lb 12.8 oz (81.1 kg)  06/28/21 174 lb 9.6 oz (79.2 kg)    Physical Exam Constitutional:      General: She is not in acute distress.    Appearance: She is well-developed.  HENT:     Head: Normocephalic.     Right Ear: External ear normal.     Left Ear: External ear normal.     Nose: Nose normal.  Eyes:     General:        Right eye: No discharge.        Left eye: No discharge.     Conjunctiva/sclera: Conjunctivae normal.     Pupils: Pupils are equal, round, and reactive to light.  Neck:     Thyroid: No thyromegaly.     Vascular: No JVD.     Trachea: No tracheal deviation.  Cardiovascular:     Rate and Rhythm: Normal rate and regular rhythm.     Heart sounds: Normal heart sounds.  Pulmonary:     Effort: No respiratory distress.     Breath sounds: No stridor. No wheezing.  Abdominal:     General: Bowel sounds are normal. There is no distension.     Palpations: Abdomen is soft. There is no mass.     Tenderness: There is  no abdominal tenderness. There is no guarding or rebound.  Musculoskeletal:        General: No tenderness.     Cervical back: Normal range of motion and neck supple. No rigidity.  Lymphadenopathy:     Cervical: No cervical adenopathy.  Skin:    Findings: No erythema or rash.  Neurological:     Cranial Nerves: No cranial nerve deficit.     Motor: No abnormal muscle tone.     Coordination: Coordination normal.     Deep Tendon Reflexes: Reflexes normal.  Psychiatric:        Behavior: Behavior normal.        Thought Content: Thought content normal.        Judgment: Judgment normal.     Lab Results  Component Value Date   WBC 6.0 05/19/2021   HGB 13.6 05/19/2021   HCT 41.0 05/19/2021   PLT 272.0 05/19/2021   GLUCOSE 87 05/18/2021   CHOL 233 (H) 05/18/2021   TRIG 114.0  05/18/2021   HDL 55.30 05/18/2021   LDLDIRECT 134.0 07/08/2015   LDLCALC 155 (H) 05/18/2021   ALT 13 05/18/2021   AST 22 05/18/2021   NA 135 05/18/2021   K 3.4 (L) 05/18/2021   CL 101 05/18/2021   CREATININE 1.22 (H) 05/18/2021   BUN 22 05/18/2021   CO2 24 05/18/2021   TSH 3.80 05/18/2021   INR 1.0 06/09/2014    US Abdomen Complete  Result Date: 06/15/2021 CLINICAL DATA:  Abdominal distension and renal insufficiency. EXAM: ABDOMEN ULTRASOUND COMPLETE COMPARISON:  12/25/2017 FINDINGS: Gallbladder: Gallbladder is well distended. Gallstones are seen. No wall thickening or pericholecystic fluid is noted. Common bile duct: Diameter: 3.7 mm. Liver: Diffuse heterogeneity is noted without focal mass. Mild increased echogenicity is noted consistent with fatty infiltration. Portal vein is patent on color Doppler imaging with normal direction of blood flow towards the liver. IVC: No abnormality visualized. Pancreas: Visualized portion unremarkable. Spleen: Size and appearance within normal limits. Right Kidney: Length: 9 cm. 1.5 cm cyst is noted in the midportion of the right kidney. No mass or hydronephrosis is noted. Left Kidney: Length: 8.2 cm. Echogenicity within normal limits. No mass or hydronephrosis visualized. Abdominal aorta: No aneurysm visualized. Other findings: None. IMPRESSION: Fatty infiltration of the liver. Cholelithiasis without complicating factors. Right renal cyst. Electronically Signed   By: Inez Catalina M.D.   On: 06/15/2021 02:51    Assessment & Plan:   Problem List Items Addressed This Visit     Blepharospasm (Chronic)    F/u w/Dr Krista Blue      Cholelithiasis   CRI (chronic renal insufficiency), stage 3 (moderate) (HCC)    GFR - monitoring Needs to hydrate well      Dyslipidemia    Check CMET      Essential hypertension    Cont on Losartan, Tenoretic      Relevant Medications   atenolol-chlorthalidone (TENORETIC) 100-25 MG tablet   losartan (COZAAR) 100 MG  tablet   History of colonic polyps     Last colon 2011 - Dr Olevia Perches - one hyperplastic polyp, rec. repeat in 7-10 years We can order Cologuard      Other Visit Diagnoses     Colon cancer screening    -  Primary   Relevant Orders   Cologuard         Meds ordered this encounter  Medications   atenolol-chlorthalidone (TENORETIC) 100-25 MG tablet    Sig: Take 1 tablet by mouth every morning.  Dispense:  90 tablet    Refill:  3   losartan (COZAAR) 100 MG tablet    Sig: TAKE 1 TABLET(100 MG) BY MOUTH EVERY MORNING    Dispense:  90 tablet    Refill:  3   potassium chloride (KLOR-CON M) 10 MEQ tablet    Sig: Take 1 tablet (10 mEq total) by mouth 2 (two) times daily.    Dispense:  90 tablet    Refill:  3   trihexyphenidyl (ARTANE) 2 MG tablet    Sig: TAKE 1 AND 1/2 TABLETS BY MOUTH THREE TIMES DAILY.    Dispense:  410 tablet    Refill:  1      Follow-up: Return in about 6 months (around 06/28/2022) for Wellness Exam.  Walker Kehr, MD

## 2021-12-27 NOTE — Assessment & Plan Note (Signed)
Check CMET. 

## 2021-12-27 NOTE — Assessment & Plan Note (Signed)
Cont on Losartan, Tenoretic

## 2021-12-28 ENCOUNTER — Other Ambulatory Visit: Payer: Self-pay | Admitting: Internal Medicine

## 2021-12-28 DIAGNOSIS — N183 Chronic kidney disease, stage 3 unspecified: Secondary | ICD-10-CM

## 2021-12-28 DIAGNOSIS — I10 Essential (primary) hypertension: Secondary | ICD-10-CM

## 2021-12-28 MED ORDER — ATENOLOL 100 MG PO TABS: 100.0000 mg | ORAL_TABLET | Freq: Every day | ORAL | 3 refills | Status: DC

## 2022-03-28 DIAGNOSIS — N184 Chronic kidney disease, stage 4 (severe): Secondary | ICD-10-CM | POA: Diagnosis not present

## 2022-03-28 DIAGNOSIS — I129 Hypertensive chronic kidney disease with stage 1 through stage 4 chronic kidney disease, or unspecified chronic kidney disease: Secondary | ICD-10-CM | POA: Diagnosis not present

## 2022-03-28 DIAGNOSIS — N189 Chronic kidney disease, unspecified: Secondary | ICD-10-CM | POA: Diagnosis not present

## 2022-03-28 DIAGNOSIS — N2581 Secondary hyperparathyroidism of renal origin: Secondary | ICD-10-CM | POA: Diagnosis not present

## 2022-03-28 DIAGNOSIS — D631 Anemia in chronic kidney disease: Secondary | ICD-10-CM | POA: Diagnosis not present

## 2022-03-29 ENCOUNTER — Other Ambulatory Visit: Payer: Self-pay | Admitting: Nephrology

## 2022-03-29 DIAGNOSIS — N184 Chronic kidney disease, stage 4 (severe): Secondary | ICD-10-CM

## 2022-03-30 ENCOUNTER — Ambulatory Visit (INDEPENDENT_AMBULATORY_CARE_PROVIDER_SITE_OTHER): Payer: Medicare HMO

## 2022-03-30 ENCOUNTER — Ambulatory Visit (INDEPENDENT_AMBULATORY_CARE_PROVIDER_SITE_OTHER): Payer: Medicare HMO | Admitting: Internal Medicine

## 2022-03-30 ENCOUNTER — Other Ambulatory Visit: Payer: Self-pay | Admitting: Internal Medicine

## 2022-03-30 VITALS — BP 156/78 | HR 68 | Temp 99.3°F | Ht 65.0 in | Wt 179.0 lb

## 2022-03-30 DIAGNOSIS — M545 Low back pain, unspecified: Secondary | ICD-10-CM | POA: Diagnosis not present

## 2022-03-30 DIAGNOSIS — M47816 Spondylosis without myelopathy or radiculopathy, lumbar region: Secondary | ICD-10-CM | POA: Diagnosis not present

## 2022-03-30 DIAGNOSIS — M25552 Pain in left hip: Secondary | ICD-10-CM | POA: Diagnosis not present

## 2022-03-30 DIAGNOSIS — F419 Anxiety disorder, unspecified: Secondary | ICD-10-CM | POA: Diagnosis not present

## 2022-03-30 DIAGNOSIS — I10 Essential (primary) hypertension: Secondary | ICD-10-CM | POA: Diagnosis not present

## 2022-03-30 DIAGNOSIS — M25551 Pain in right hip: Secondary | ICD-10-CM | POA: Diagnosis not present

## 2022-03-30 MED ORDER — CYCLOBENZAPRINE HCL 5 MG PO TABS: 5.0000 mg | ORAL_TABLET | Freq: Three times a day (TID) | ORAL | 1 refills | Status: AC | PRN

## 2022-03-30 MED ORDER — KETOROLAC TROMETHAMINE 30 MG/ML IJ SOLN
30.0000 mg | Freq: Once | INTRAMUSCULAR | Status: AC
  Administered 2022-03-30: 30 mg via INTRAVENOUS

## 2022-03-30 MED ORDER — TRAMADOL HCL 50 MG PO TABS: 50.0000 mg | ORAL_TABLET | Freq: Four times a day (QID) | ORAL | 0 refills | Status: AC | PRN

## 2022-03-30 NOTE — Patient Instructions (Signed)
You had the pain shot today (toradol 30 mg)  Please take all new medication as prescribed - the tramadol, and the muscle relaxer as needed  Please continue all other medications as before, and refills have been done if requested.  Please have the pharmacy call with any other refills you may need.  Please keep your appointments with your specialists as you may have planned  Please go to the XRAY Department in the first floor for the x-ray testing  You will be contacted by phone if any changes need to be made immediately.  Otherwise, you will receive a letter about your results with an explanation, but please check with MyChart first.  Please remember to sign up for MyChart if you have not done so, as this will be important to you in the future with finding out test results, communicating by private email, and scheduling acute appointments online when needed.

## 2022-04-01 ENCOUNTER — Telehealth: Payer: Self-pay | Admitting: Internal Medicine

## 2022-04-01 MED ORDER — PREDNISONE 10 MG PO TABS: ORAL_TABLET | ORAL | 0 refills | Status: AC

## 2022-04-01 NOTE — Telephone Encounter (Signed)
Patient said that the tramadol did not relieve the pain - daughter said something needs to be done.

## 2022-04-01 NOTE — Telephone Encounter (Signed)
I dont prescribe higher narcotics, but I can add prednisone taper - I will order, thanks

## 2022-04-01 NOTE — Telephone Encounter (Signed)
See beloiw

## 2022-04-01 NOTE — Telephone Encounter (Signed)
Hip xrays were normal, and the lumbar xray shows mild to mod arthritis and mild disc deterioration, but o/w looked ok - no fracture or other unusual problems  I had prescribed tramadol at the visit sept 20, so I am not able to prescribe stronger for now

## 2022-04-01 NOTE — Telephone Encounter (Signed)
Patient's daughter states that she is still in a lot of pain and they want to know if Dr. Jenny Reichmann can read the xrays and prescribe something based on them

## 2022-04-02 ENCOUNTER — Encounter: Payer: Self-pay | Admitting: Internal Medicine

## 2022-04-02 NOTE — Progress Notes (Signed)
Patient ID: Laurie Pratt, female   DOB: 1940-12-29, 81 y.o.   MRN: [id number]        Chief Complaint: follow up left low back pain, htn, anxiety       HPI:  Laurie Pratt is a 81 y.o. female here with granddaughter, c/o acute onset 3 days left lower back and side pain, sharp, mod to severe, no LE radicular symptoms, Denies urinary symptoms such as dysuria, frequency, urgency, flank pain, hematuria or n/v, fever, chills.  Denies worsening reflux, abd pain, dysphagia, n/v, bowel change or blood.   Pt denies fever, wt loss, night sweats, loss of appetite, or other constitutional symptoms  Denies worsening depressive symptoms, suicidal ideation, or panic; has ongoing anxiety   Did have a fall 3 wks ago but no injury at the time       Wt Readings from Last 3 Encounters:  03/30/22 179 lb (81.2 kg)  12/27/21 173 lb (78.5 kg)  09/13/21 178 lb 12.8 oz (81.1 kg)   BP Readings from Last 3 Encounters:  03/30/22 (!) 156/78  12/27/21 140/82  09/13/21 (!) 150/60         Past Medical History:  Diagnosis Date   Glaucoma    History of cervical dysplasia    CIN III  w Severe dysplasia  s/p  cervical cone bx 09/ 2014   History of colon polyps    hyperplastic 08/ 2011   Hyperlipidemia    diet controlled - no meds   Hypertension    Neuromuscular disorder ([place])    Osteopenia 12/2017   T score -1.7 FRAX 5.1% / 1.0%   Tics of organic origin    Dr. Doy Mince   VAIN II (vaginal intraepithelial neoplasia grade II)    Wears glasses    Past Surgical History:  Procedure Laterality Date   ABDOMINAL HYSTERECTOMY N/A 07/22/2014   Procedure: HYSTERECTOMY ABDOMINAL;  Surgeon: Terrance Mass, MD;  Location: [facility];  Service: Gynecology;  Laterality: N/A;   CATARACT EXTRACTION W/ INTRAOCULAR LENS IMPLANT Left 2014   CERVICAL CONIZATION W/BX N/A 03/12/2013   Procedure: COLD KNIFE CONIZATION CERVIX WITH BIOPSY/EXAM UNDER ANESTHESIA, RECTAL EXAM;  Surgeon: Alvino Chapel, MD;  Location: [facility];  Service: Gynecology;  Laterality: N/A;   CERVICAL CONIZATION W/BX N/A 03/12/2013   Procedure: CONIZATION CERVIX WITH BIOPSY;  Surgeon: Alvino Chapel, MD;  Location: [facility];  Service: Gynecology;  Laterality: N/A;  patient returned to OR for fulgeration to stop bleeding   CO2 LASER APPLICATION N/A 9/67/5916   Procedure: CO2 LASER OF THE VAGINA;  Surgeon: [name], MD;  Location: [facility];  Service: Gynecology;  Laterality: N/A;   COLONOSCOPY  02-23-2010   DILATION AND CURETTAGE OF UTERUS     SALPINGOOPHORECTOMY Bilateral 07/22/2014   Procedure: BILATERAL SALPINGO OOPHORECTOMY;  Surgeon: Terrance Mass, MD;  Location: [facility];  Service: Gynecology;  Laterality: Bilateral;   TUBAL LIGATION      reports that she has never smoked. She has never used smokeless tobacco. She reports current alcohol use. She reports that she does not use drugs. family history includes Diabetes in her father; Heart disease in her maternal aunt; Hypertension in her father and mother; Ovarian cancer in her sister. Allergies  Allergen Reactions   Ace Inhibitors Other (See Comments)    cough   Doxazosin Mesylate     Unknown reaction   Verapamil Swelling    REACTION: legs swelling, tired  Current Outpatient Medications on File Prior to Visit  Medication Sig Dispense Refill   atenolol (TENORMIN) 100 MG tablet Take 1 tablet (100 mg total) by mouth daily. 90 tablet 3   Cholecalciferol (VITAMIN D3) 1000 units CAPS Take 1 capsule by mouth every morning.     losartan (COZAAR) 100 MG tablet TAKE 1 TABLET(100 MG) BY MOUTH EVERY MORNING 90 tablet 3   potassium chloride (KLOR-CON M) 10 MEQ tablet Take 1 tablet (10 mEq total) by mouth 2 (two) times daily. 90 tablet 3   trihexyphenidyl (ARTANE) 2 MG tablet TAKE 1 AND 1/2 TABLETS BY MOUTH THREE TIMES DAILY. 410 tablet 1   No current facility-administered medications on file prior to visit.        ROS:  All others  reviewed and negative.  Objective        PE:  BP (!) 156/78 (BP Location: Right Arm, Patient Position: Sitting, Cuff Size: Large)   Pulse 68   Temp 99.3 F (37.4 C) (Oral)   Ht '5\' 5"'$  (1.651 m)   Wt 179 lb (81.2 kg)   SpO2 97%   BMI 29.79 kg/m                 Constitutional: Pt appears in NAD               HENT: Head: NCAT.                Right Ear: External ear normal.                 Left Ear: External ear normal.                Eyes: . Pupils are equal, round, and reactive to light. Conjunctivae and EOM are normal               Nose: without d/c or deformity               Neck: Neck supple. Gross normal ROM               Cardiovascular: Normal rate and regular rhythm.                 Pulmonary/Chest: Effort normal and breath sounds without rales or wheezing.                Abd:  Soft, NT, ND, + BS, no organomegaly; spine nontender in midline, but has svere msk tender spasm left lumbar para vertebral               Neurological: Pt is alert. At baseline orientation, motor grossly intact               Skin: Skin is warm. No rashes, no other new lesions, LE edema - none               Psychiatric: Pt behavior is normal without agitation but animated, pressure speech  Micro: none  Cardiac tracings I have personally interpreted today:  none  Pertinent Radiological findings (summarize): none   Lab Results  Component Value Date   WBC 6.0 05/19/2021   HGB 13.6 05/19/2021   HCT 41.0 05/19/2021   PLT 272.0 05/19/2021   GLUCOSE 97 12/27/2021   CHOL 233 (H) 05/18/2021   TRIG 114.0 05/18/2021   HDL 55.30 05/18/2021   LDLDIRECT 134.0 07/08/2015   LDLCALC 155 (H) 05/18/2021   ALT 12 12/27/2021   AST 19 12/27/2021   NA 138 12/27/2021  K 3.9 12/27/2021   CL 104 12/27/2021   CREATININE 1.73 (H) 12/27/2021   BUN 24 (H) 12/27/2021   CO2 27 12/27/2021   TSH 3.80 05/18/2021   INR 1.0 06/09/2014   Assessment/Plan:  Laurie Pratt is a 81 y.o. Black or African American [2] female  with  has a past medical history of Glaucoma, History of cervical dysplasia, History of colon polyps, Hyperlipidemia, Hypertension, Neuromuscular disorder ([place]), Osteopenia (12/2017), Tics of organic origin, VAIN II (vaginal intraepithelial neoplasia grade II), and Wears glasses.  Anxiety Mild worsening situational, no panic, declines change in tx for now or referral, cont artane  Essential hypertension BP Readings from Last 3 Encounters:  03/30/22 (!) 156/78  12/27/21 140/82  09/13/21 (!) 150/60   Uncontrolled, likely reactive,, pt to continue medical treatment tenormin 100 mg qd, losartan 100 mg qd as declines change for now   Lower back pain Localized left lumbar paravertebral with spasm on exam c/w msk strain, for toradol 30 mg IM x 1 now, then tramadol prn, flexeril 5 mg prn, for lumbar films though the fall was 3 wks ago,  to f/u any worsening symptoms or concerns  Followup: Return if symptoms worsen or fail to improve.  Cathlean Cower, MD 04/02/2022 11:38 AM [facility]

## 2022-04-02 NOTE — Assessment & Plan Note (Signed)
BP Readings from Last 3 Encounters:  03/30/22 (!) 156/78  12/27/21 140/82  09/13/21 (!) 150/60   Uncontrolled, likely reactive,, pt to continue medical treatment tenormin 100 mg qd, losartan 100 mg qd as declines change for now

## 2022-04-02 NOTE — Assessment & Plan Note (Addendum)
Localized left lumbar paravertebral with spasm on exam c/w msk strain, for toradol 30 mg IM x 1 now, then tramadol prn, flexeril 5 mg prn, for lumbar films though the fall was 3 wks ago,  to f/u any worsening symptoms or concerns

## 2022-04-02 NOTE — Assessment & Plan Note (Signed)
Mild worsening situational, no panic, declines change in tx for now or referral, cont artane

## 2022-04-05 NOTE — Telephone Encounter (Signed)
Patient's daughter said that the tramadol made her drousy but did not take her pain away. I advised that new Rx for prednisone was sent to the pharmacy.

## 2022-04-18 ENCOUNTER — Ambulatory Visit
Admission: RE | Admit: 2022-04-18 | Discharge: 2022-04-18 | Disposition: A | Discharge status: Home / Self Care | Payer: Medicare HMO | Source: Ambulatory Visit | Attending: Nephrology | Admitting: Nephrology

## 2022-04-18 DIAGNOSIS — N184 Chronic kidney disease, stage 4 (severe): Secondary | ICD-10-CM | POA: Diagnosis not present

## 2022-07-18 ENCOUNTER — Ambulatory Visit: Payer: Medicare HMO | Admitting: Internal Medicine

## 2022-08-08 DIAGNOSIS — N2581 Secondary hyperparathyroidism of renal origin: Secondary | ICD-10-CM | POA: Diagnosis not present

## 2022-08-08 DIAGNOSIS — I129 Hypertensive chronic kidney disease with stage 1 through stage 4 chronic kidney disease, or unspecified chronic kidney disease: Secondary | ICD-10-CM | POA: Diagnosis not present

## 2022-08-08 DIAGNOSIS — N189 Chronic kidney disease, unspecified: Secondary | ICD-10-CM | POA: Diagnosis not present

## 2022-08-08 DIAGNOSIS — D631 Anemia in chronic kidney disease: Secondary | ICD-10-CM | POA: Diagnosis not present

## 2022-08-08 DIAGNOSIS — N183 Chronic kidney disease, stage 3 unspecified: Secondary | ICD-10-CM | POA: Diagnosis not present

## 2022-08-19 DIAGNOSIS — I129 Hypertensive chronic kidney disease with stage 1 through stage 4 chronic kidney disease, or unspecified chronic kidney disease: Secondary | ICD-10-CM | POA: Diagnosis not present

## 2022-12-19 ENCOUNTER — Ambulatory Visit: Payer: Medicare Other

## 2022-12-19 ENCOUNTER — Telehealth: Payer: Self-pay

## 2022-12-19 NOTE — Telephone Encounter (Signed)
Unsuccessful attempt to reach patient on preferred number listed in notes for scheduled AWV, Left message on voicemail okay to reschedule.  Jemina Scahill N. Annabeth Tortora, LPN. [facility] AWV Team Direct Dial: [phone]

## 2023-01-25 ENCOUNTER — Other Ambulatory Visit: Payer: Self-pay | Admitting: Internal Medicine

## 2023-03-22 ENCOUNTER — Ambulatory Visit (INDEPENDENT_AMBULATORY_CARE_PROVIDER_SITE_OTHER): Payer: Medicare PPO

## 2023-03-22 VITALS — Ht 65.0 in | Wt 177.0 lb

## 2023-03-22 DIAGNOSIS — Z0000 Encounter for general adult medical examination without abnormal findings: Secondary | ICD-10-CM

## 2023-03-22 NOTE — Patient Instructions (Addendum)
Ms. Lamberth , Thank you for taking time to come for your Medicare Wellness Visit. I appreciate your ongoing commitment to your health goals. Please review the following plan we discussed and let me know if I can assist you in the future.   Referrals/Orders/Follow-Ups/Clinician Recommendations: No  This is a list of the screening recommended for you and due dates:  Health Maintenance  Topic Date Due   DTaP/Tdap/Td vaccine (1 - Tdap) Never done   Pneumonia Vaccine (1 of 1 - PCV) Never done   COVID-19 Vaccine (3 - 2023-24 season) 03/12/2023   Medicare Annual Wellness Visit  03/21/2024   DEXA scan (bone density measurement)  Completed   Hepatitis C Screening  Completed   HPV Vaccine  Aged Out   Flu Shot  Discontinued   Colon Cancer Screening  Discontinued   Zoster (Shingles) Vaccine  Discontinued    Advanced directives: (Copy Requested) Please bring a copy of your health care power of attorney and living will to the office to be added to your chart at your convenience.  Next Medicare Annual Wellness Visit scheduled for next year: Yes; It was nice speaking with you today!  Your next scheduled Medicare Wellness Visit is scheduled with Nurse Percell Miller, via telephone on 03/25/2024 at 2:00 p.m. If you need to cancel or reschedule please call [phone].  *Preventive Care attachment

## 2023-03-22 NOTE — Progress Notes (Addendum)
Subjective:   Laurie Pratt is a 82 y.o. female who presents for Medicare Annual (Subsequent) preventive examination.  Visit Complete: Virtual  I connected with  Laurie Pratt on 03/22/23 by a audio enabled telemedicine application and verified that I am speaking with the correct person using two identifiers.  Patient Location: Home  Provider Location: Office/Clinic  I discussed the limitations of evaluation and management by telemedicine. The patient expressed understanding and agreed to proceed.  Vital Signs: Because this visit was a virtual/telehealth visit, some criteria may be missing or patient reported. Any vitals not documented were not able to be obtained and vitals that have been documented are patient reported.   Patient provided his/her weight during this virtual phone visit.  Review of Systems     Cardiac Risk Factors include: advanced age (>98men, >74 women);dyslipidemia;family history of premature cardiovascular disease;hypertension     Objective:    Today's Vitals   03/22/23 1402  Weight: 177 lb (80.3 kg)  Height: 5\' 5"  (1.651 m)  PainSc: 0-No pain   Body mass index is 29.45 kg/m.     03/22/2023    2:04 PM 12/16/2021    2:19 PM 12/14/2020   12:46 PM 12/31/2015   11:05 AM 07/22/2014    5:21 PM 07/18/2014    9:43 AM 03/12/2013    7:09 AM  Advanced Directives  Does Patient Have a Medical Advance Directive? Yes Yes No Yes No No Patient has advance directive, copy not in chart  Type of Advance Directive Healthcare Power of [place];Living will Living will;Healthcare Power of Attorney  Living will   Living will  Does patient want to make changes to medical advance directive?  No - Patient declined No - Patient declined No - Patient declined     Copy of Healthcare Power of Attorney in Chart? No - copy requested No - copy requested     Copy requested from family  Would patient like information on creating a medical advance directive?     No - patient declined  information No - patient declined information   Pre-existing out of facility DNR order (yellow form or pink MOST form)       No    Current Medications (verified) Outpatient Encounter Medications as of 03/22/2023  Medication Sig   atenolol (TENORMIN) 100 MG tablet Take 1 tablet (100 mg total) by mouth daily.   Cholecalciferol (VITAMIN D3) 1000 units CAPS Take 1 capsule by mouth every morning.   cyclobenzaprine (FLEXERIL) 5 MG tablet Take 1 tablet (5 mg total) by mouth 3 (three) times daily as needed.   losartan (COZAAR) 100 MG tablet TAKE 1 TABLET(100 MG) BY MOUTH EVERY MORNING   potassium chloride (KLOR-CON M) 10 MEQ tablet TAKE 1 TABLET(10 MEQ) BY MOUTH TWICE DAILY   predniSONE (DELTASONE) 10 MG tablet 3 tabs by mouth per day for 3 days,2tabs per day for 3 days,1tab per day for 3 days   traMADol (ULTRAM) 50 MG tablet Take 1 tablet (50 mg total) by mouth every 6 (six) hours as needed.   trihexyphenidyl (ARTANE) 2 MG tablet TAKE 1& 1/2 TABLETS BY MOUTH THREE TIMES DAILY   No facility-administered encounter medications on file as of 03/22/2023.    Allergies (verified) Ace inhibitors, Doxazosin mesylate, and Verapamil   History: Past Medical History:  Diagnosis Date   Glaucoma    History of cervical dysplasia    CIN III  w Severe dysplasia  s/p  cervical cone bx 09/ 2014  History of colon polyps    hyperplastic 08/ 2011   Hyperlipidemia    diet controlled - no meds   Hypertension    Neuromuscular disorder (HCC)    Osteopenia 12/2017   T score -1.7 FRAX 5.1% / 1.0%   Tics of organic origin    Dr. Thad Ranger   VAIN II (vaginal intraepithelial neoplasia grade II)    Wears glasses    Past Surgical History:  Procedure Laterality Date   ABDOMINAL HYSTERECTOMY N/A 07/22/2014   Procedure: HYSTERECTOMY ABDOMINAL;  Surgeon: Ok Edwards, MD;  Location: [facility];  Service: Gynecology;  Laterality: N/A;   CATARACT EXTRACTION W/ INTRAOCULAR LENS IMPLANT Left 2014   CERVICAL CONIZATION  W/BX N/A 03/12/2013   Procedure: COLD KNIFE CONIZATION CERVIX WITH BIOPSY/EXAM UNDER ANESTHESIA, RECTAL EXAM;  Surgeon: Jeannette Corpus, MD;  Location: [facility];  Service: Gynecology;  Laterality: N/A;   CERVICAL CONIZATION W/BX N/A 03/12/2013   Procedure: CONIZATION CERVIX WITH BIOPSY;  Surgeon: Jeannette Corpus, MD;  Location: [facility];  Service: Gynecology;  Laterality: N/A;  patient returned to OR for fulgeration to stop bleeding   CO2 LASER APPLICATION N/A 12/31/2015   Procedure: CO2 LASER OF THE VAGINA;  Surgeon: Adolphus Birchwood, MD;  Location: [facility];  Service: Gynecology;  Laterality: N/A;   COLONOSCOPY  02-23-2010   DILATION AND CURETTAGE OF UTERUS     SALPINGOOPHORECTOMY Bilateral 07/22/2014   Procedure: BILATERAL SALPINGO OOPHORECTOMY;  Surgeon: Ok Edwards, MD;  Location: [facility];  Service: Gynecology;  Laterality: Bilateral;   TUBAL LIGATION     Family History  Problem Relation Age of Onset   Hypertension Mother    Diabetes Father    Hypertension Father    Heart disease Maternal Aunt        CHF   Ovarian cancer Sister    Colon cancer Neg Hx    Esophageal cancer Neg Hx    Pancreatic cancer Neg Hx    Stomach cancer Neg Hx    Social History   Socioeconomic History   Marital status: Widowed    Spouse name: Not on file   Number of children: 2   Years of education: 12   Highest education level: High school graduate  Occupational History   Occupation: retired   Tobacco Use   Smoking status: Never   Smokeless tobacco: Never  Vaping Use   Vaping status: Never Used  Substance and Sexual Activity   Alcohol use: Yes    Alcohol/week: 0.0 standard drinks of alcohol    Comment: rare; 1 beer per year   Drug use: No   Sexual activity: Not Currently    Comment: 1st intercourse- 19, partners- 2, widow  Other Topics Concern   Not on file  Social History Narrative   Patient is widowed with 2 children.     Patient is right handed   Patient has a high school education   Patient drinks 1 cup daily   Exercises regulary   Social Determinants of Health   Financial Resource Strain: Low Risk  (03/22/2023)   Overall Financial Resource Strain (CARDIA)    Difficulty of Paying Living Expenses: Not hard at all  Food Insecurity: No Food Insecurity (03/22/2023)   Hunger Vital Sign    Worried About Running Out of Food in the Last Year: Never true    Ran Out of Food in the Last Year: Never true  Transportation Needs: No Transportation Needs (03/22/2023)  PRAPARE - Administrator, Civil Service (Medical): No    Lack of Transportation (Non-Medical): No  Physical Activity: Sufficiently Active (03/22/2023)   Exercise Vital Sign    Days of Exercise per Week: 5 days    Minutes of Exercise per Session: 30 min  Stress: No Stress Concern Present (12/16/2021)   [employer] of Occupational Health - Occupational Stress Questionnaire    Feeling of Stress : Only a little  Social Connections: Moderately Integrated (03/22/2023)   Social Connection and Isolation Panel [NHANES]    Frequency of Communication with Friends and Family: More than three times a week    Frequency of Social Gatherings with Friends and Family: More than three times a week    Attends Religious Services: More than 4 times per year    Active Member of [employer] or Organizations: Yes    Attends Banker Meetings: More than 4 times per year    Marital Status: Widowed    Tobacco Counseling Counseling given: Not Answered   Clinical Intake:  Pre-visit preparation completed: Yes  Pain : No/denies pain Pain Score: 0-No pain     BMI - recorded: 29.45 Nutritional Status: BMI 25 -29 Overweight Nutritional Risks: None Diabetes: No  How often do you need to have someone help you when you read instructions, pamphlets, or other written materials from your doctor or pharmacy?: 1 - Never What is the last grade level you  completed in school?: HSG  Interpreter Needed?: No  Information entered by :: Ryna Beckstrom N. Treylin Burtch, LPN.   Activities of Daily Living    03/22/2023    2:06 PM  In your present state of health, do you have any difficulty performing the following activities:  Hearing? 0  Vision? 0  Difficulty concentrating or making decisions? 0  Walking or climbing stairs? 0  Dressing or bathing? 0  Doing errands, shopping? 0  Preparing Food and eating ? N  Using the Toilet? N  In the past six months, have you accidently leaked urine? N  Do you have problems with loss of bowel control? N  Managing your Medications? N  Managing your Finances? N  Housekeeping or managing your Housekeeping? N    Patient Care Team: Plotnikov, Georgina Quint, MD as PCP - General Juanda Chance Hedwig Morton, MD (Inactive) (Gastroenterology) Ernesto Rutherford, MD as Consulting Physician (Ophthalmology)  Indicate any recent Medical Services you may have received from other than [facility] providers in the past year (date may be approximate).     Assessment:   This is a routine wellness examination for [place].  Hearing/Vision screen Hearing Screening - Comments:: Patient denied any hearing difficulty.   No hearing aids.  Vision Screening - Comments:: Patient does wear corrective lenses/contacts.  Annual eye exam done by: Ernesto Rutherford, MD.    Goals Addressed   None   Depression Screen    03/22/2023    2:06 PM 12/27/2021   10:00 AM 12/16/2021    2:22 PM 05/18/2021    9:53 AM 12/14/2020   12:45 PM 11/16/2020    1:27 PM 09/23/2019    3:11 PM  PHQ 2/9 Scores  PHQ - 2 Score 0 0 0 0 0 0 0  PHQ- 9 Score 0   0 0 0     Fall Risk    03/22/2023    2:05 PM 12/27/2021   10:00 AM 12/16/2021    2:20 PM 05/18/2021    9:53 AM 12/14/2020   12:46 PM  Fall Risk  Falls in the past year? 0 0 0 0 1  Number falls in past yr: 0 0 0 0 0  Injury with Fall? 0 0 0 0 0  Risk for fall due to : No Fall Risks No Fall Risks No Fall Risks No Fall Risks History of  fall(s)  Follow up Falls prevention discussed Falls evaluation completed Falls evaluation completed  Falls evaluation completed    MEDICARE RISK AT HOME: Medicare Risk at Home Any stairs in or around the home?: No If so, are there any without handrails?: No Home free of loose throw rugs in walkways, pet beds, electrical cords, etc?: Yes Adequate lighting in your home to reduce risk of falls?: Yes Life alert?: No Use of a cane, walker or w/c?: No Grab bars in the bathroom?: No Shower chair or bench in shower?: Yes Elevated toilet seat or a handicapped toilet?: No  TIMED UP AND GO:  Was the test performed?  No    Cognitive Function:        03/22/2023    2:06 PM 12/16/2021    2:28 PM  6CIT Screen  What Year? 0 points 0 points  What month? 0 points 0 points  What time? 0 points 0 points  Count back from 20 0 points 0 points  Months in reverse 0 points 0 points  Repeat phrase 0 points 0 points  Total Score 0 points 0 points    Immunizations Immunization History  Administered Date(s) Administered   PFIZER(Purple Top)SARS-COV-2 Vaccination 02/17/2020, 03/09/2020    TDAP status: Due, Education has been provided regarding the importance of this vaccine. Advised may receive this vaccine at local pharmacy or Health Dept. Aware to provide a copy of the vaccination record if obtained from local pharmacy or Health Dept. Verbalized acceptance and understanding.  Flu Vaccine status: Declined, Education has been provided regarding the importance of this vaccine but patient still declined. Advised may receive this vaccine at local pharmacy or Health Dept. Aware to provide a copy of the vaccination record if obtained from local pharmacy or Health Dept. Verbalized acceptance and understanding.  Pneumococcal vaccine status: Declined,  Education has been provided regarding the importance of this vaccine but patient still declined. Advised may receive this vaccine at local pharmacy or Health  Dept. Aware to provide a copy of the vaccination record if obtained from local pharmacy or Health Dept. Verbalized acceptance and understanding.   Covid-19 vaccine status: Completed vaccines  Qualifies for Shingles Vaccine? Yes   Zostavax completed No   Shingrix Completed?: No.    Education has been provided regarding the importance of this vaccine. Patient has been advised to call insurance company to determine out of pocket expense if they have not yet received this vaccine. Advised may also receive vaccine at local pharmacy or Health Dept. Verbalized acceptance and understanding.  Screening Tests Health Maintenance  Topic Date Due   DTaP/Tdap/Td (1 - Tdap) Never done   Pneumonia Vaccine 39+ Years old (1 of 1 - PCV) Never done   COVID-19 Vaccine (3 - 2023-24 season) 03/12/2023   Medicare Annual Wellness (AWV)  03/21/2024   DEXA SCAN  Completed   Hepatitis C Screening  Completed   HPV VACCINES  Aged Out   INFLUENZA VACCINE  Discontinued   Colonoscopy  Discontinued   Zoster Vaccines- Shingrix  Discontinued    Health Maintenance  Health Maintenance Due  Topic Date Due   DTaP/Tdap/Td (1 - Tdap) Never done   Pneumonia Vaccine 40+ Years old (  1 of 1 - PCV) Never done   COVID-19 Vaccine (3 - 2023-24 season) 03/12/2023    Colorectal cancer screening: No longer required.   Mammogram status: No longer required due to age/patient declined.  Bone Density status: Completed 12/12/2017. Results reflect: Bone density results: OSTEOPENIA. Repeat every 2-3 years. Patient declined any additional bone density screenings.  Lung Cancer Screening: (Low Dose CT Chest recommended if Age 39-80 years, 20 pack-year currently smoking OR have quit w/in 15years.) does not qualify.   Lung Cancer Screening Referral: no  Additional Screening:  Hepatitis C Screening: does qualify; Completed 07/08/2015  Vision Screening: Recommended annual ophthalmology exams for early detection of glaucoma and other  disorders of the eye. Is the patient up to date with their annual eye exam?  Yes  Who is the provider or what is the name of the office in which the patient attends annual eye exams? Ernesto Rutherford, MD. If pt is not established with a provider, would they like to be referred to a provider to establish care? No .   Dental Screening: Recommended annual dental exams for proper oral hygiene  Diabetic Foot Exam: N/A  Community Resource Referral / Chronic Care Management: CRR required this visit?  No   CCM required this visit?  No     Plan:     I have personally reviewed and noted the following in the patient's chart:   Medical and social history Use of alcohol, tobacco or illicit drugs  Current medications and supplements including opioid prescriptions. Patient is not currently taking opioid prescriptions. Functional ability and status Nutritional status Physical activity Advanced directives List of other physicians Hospitalizations, surgeries, and ER visits in previous 12 months Vitals Screenings to include cognitive, depression, and falls Referrals and appointments  In addition, I have reviewed and discussed with patient certain preventive protocols, quality metrics, and best practice recommendations. A written personalized care plan for preventive services as well as general preventive health recommendations were provided to patient.     Mickeal Needy, LPN   1/61/0960   After Visit Summary: (Mail) Due to this being a telephonic visit, the after visit summary with patients personalized plan was offered to patient via mail   Nurse Notes: Normal cognitive status assessed by direct observation via telephone conversation by this Nurse Health Advisor. No abnormalities found.  Patient provided his/her weight during this virtual phone visit.   Medical screening examination/treatment/procedure(s) were performed by non-physician practitioner and as supervising physician I was  immediately available for consultation/collaboration.  I agree with above. Jacinta Shoe, MD

## 2023-04-13 ENCOUNTER — Other Ambulatory Visit: Payer: Self-pay | Admitting: Internal Medicine

## 2023-04-28 ENCOUNTER — Other Ambulatory Visit: Payer: Self-pay | Admitting: Internal Medicine

## 2023-06-12 ENCOUNTER — Other Ambulatory Visit: Payer: Self-pay | Admitting: Internal Medicine

## 2023-06-21 DIAGNOSIS — H2511 Age-related nuclear cataract, right eye: Secondary | ICD-10-CM | POA: Diagnosis not present

## 2023-06-21 DIAGNOSIS — H401111 Primary open-angle glaucoma, right eye, mild stage: Secondary | ICD-10-CM | POA: Diagnosis not present

## 2023-06-21 DIAGNOSIS — H401123 Primary open-angle glaucoma, left eye, severe stage: Secondary | ICD-10-CM | POA: Diagnosis not present

## 2023-07-08 ENCOUNTER — Other Ambulatory Visit: Payer: Self-pay | Admitting: Internal Medicine

## 2023-08-03 DIAGNOSIS — D631 Anemia in chronic kidney disease: Secondary | ICD-10-CM | POA: Diagnosis not present

## 2023-08-03 DIAGNOSIS — N2581 Secondary hyperparathyroidism of renal origin: Secondary | ICD-10-CM | POA: Diagnosis not present

## 2023-08-03 DIAGNOSIS — N390 Urinary tract infection, site not specified: Secondary | ICD-10-CM | POA: Diagnosis not present

## 2023-08-03 DIAGNOSIS — I129 Hypertensive chronic kidney disease with stage 1 through stage 4 chronic kidney disease, or unspecified chronic kidney disease: Secondary | ICD-10-CM | POA: Diagnosis not present

## 2023-08-03 DIAGNOSIS — N183 Chronic kidney disease, stage 3 unspecified: Secondary | ICD-10-CM | POA: Diagnosis not present

## 2023-08-07 LAB — LAB REPORT - SCANNED
Albumin, Urine POC: 9.1
Calcium: 9.6
Creatinine, POC: 39.4 mg/dL
EGFR: 50
Microalb Creat Ratio: 23
PTH: 58

## 2023-08-17 DIAGNOSIS — N183 Chronic kidney disease, stage 3 unspecified: Secondary | ICD-10-CM | POA: Diagnosis not present

## 2023-08-17 DIAGNOSIS — I129 Hypertensive chronic kidney disease with stage 1 through stage 4 chronic kidney disease, or unspecified chronic kidney disease: Secondary | ICD-10-CM | POA: Diagnosis not present

## 2023-09-07 ENCOUNTER — Other Ambulatory Visit: Payer: Self-pay | Admitting: Internal Medicine

## 2023-09-08 NOTE — Telephone Encounter (Signed)
 Schedule office visit

## 2023-11-13 ENCOUNTER — Other Ambulatory Visit: Payer: Self-pay | Admitting: Internal Medicine

## 2023-11-13 DIAGNOSIS — H401111 Primary open-angle glaucoma, right eye, mild stage: Secondary | ICD-10-CM | POA: Diagnosis not present

## 2023-11-13 DIAGNOSIS — H401123 Primary open-angle glaucoma, left eye, severe stage: Secondary | ICD-10-CM | POA: Diagnosis not present

## 2023-11-13 DIAGNOSIS — H2511 Age-related nuclear cataract, right eye: Secondary | ICD-10-CM | POA: Diagnosis not present

## 2023-11-16 ENCOUNTER — Ambulatory Visit (INDEPENDENT_AMBULATORY_CARE_PROVIDER_SITE_OTHER)

## 2023-11-16 ENCOUNTER — Ambulatory Visit: Admitting: Family Medicine

## 2023-11-16 ENCOUNTER — Ambulatory Visit: Payer: Self-pay

## 2023-11-16 ENCOUNTER — Encounter: Payer: Self-pay | Admitting: Family Medicine

## 2023-11-16 VITALS — BP 138/80 | HR 58 | Temp 98.4°F | Ht 65.0 in | Wt 182.0 lb

## 2023-11-16 DIAGNOSIS — R0789 Other chest pain: Secondary | ICD-10-CM

## 2023-11-16 DIAGNOSIS — I10 Essential (primary) hypertension: Secondary | ICD-10-CM | POA: Diagnosis not present

## 2023-11-16 DIAGNOSIS — R079 Chest pain, unspecified: Secondary | ICD-10-CM | POA: Diagnosis not present

## 2023-11-16 DIAGNOSIS — R918 Other nonspecific abnormal finding of lung field: Secondary | ICD-10-CM | POA: Diagnosis not present

## 2023-11-16 DIAGNOSIS — M549 Dorsalgia, unspecified: Secondary | ICD-10-CM | POA: Diagnosis not present

## 2023-11-16 LAB — COMPREHENSIVE METABOLIC PANEL WITH GFR
ALT: 11 U/L (ref 0–35)
AST: 17 U/L (ref 0–37)
Albumin: 3.8 g/dL (ref 3.5–5.2)
Alkaline Phosphatase: 87 U/L (ref 39–117)
BUN: 17 mg/dL (ref 6–23)
CO2: 27 meq/L (ref 19–32)
Calcium: 9.5 mg/dL (ref 8.4–10.5)
Chloride: 101 meq/L (ref 96–112)
Creatinine, Ser: 1.37 mg/dL — ABNORMAL HIGH (ref 0.40–1.20)
GFR: 36.5 mL/min — ABNORMAL LOW (ref >60.00)
Glucose, Bld: 99 mg/dL (ref 70–99)
Potassium: 3.4 meq/L — ABNORMAL LOW (ref 3.5–5.1)
Sodium: 137 meq/L (ref 135–145)
Total Bilirubin: 0.5 mg/dL (ref 0.2–1.2)
Total Protein: 7.8 g/dL (ref 6.0–8.3)

## 2023-11-16 LAB — D-DIMER, QUANTITATIVE: D-Dimer, Quant: 0.76 ug{FEU}/mL — ABNORMAL HIGH (ref <0.50)

## 2023-11-16 LAB — CBC WITH DIFFERENTIAL/PLATELET
Basophils Absolute: 0 10*3/uL (ref 0.0–0.1)
Basophils Relative: 0.5 % (ref 0.0–3.0)
Eosinophils Absolute: 0.1 10*3/uL (ref 0.0–0.7)
Eosinophils Relative: 1.4 % (ref 0.0–5.0)
HCT: 37.7 % (ref 36.0–46.0)
Hemoglobin: 12.6 g/dL (ref 12.0–15.0)
Lymphocytes Relative: 17.6 % (ref 12.0–46.0)
Lymphs Abs: 1.4 10*3/uL (ref 0.7–4.0)
MCHC: 33.4 g/dL (ref 30.0–36.0)
MCV: 84.6 fl (ref 78.0–100.0)
Monocytes Absolute: 1 10*3/uL (ref 0.1–1.0)
Monocytes Relative: 13.1 % — ABNORMAL HIGH (ref 3.0–12.0)
Neutro Abs: 5.3 10*3/uL (ref 1.4–7.7)
Neutrophils Relative %: 67.4 % (ref 43.0–77.0)
Platelets: 351 10*3/uL (ref 150.0–400.0)
RBC: 4.46 Mil/uL (ref 3.87–5.11)
RDW: 14.9 % (ref 11.5–15.5)
WBC: 7.8 10*3/uL (ref 4.0–10.5)

## 2023-11-16 LAB — TROPONIN I (HIGH SENSITIVITY): High Sens Troponin I: 9 ng/L (ref 2–17)

## 2023-11-16 MED ORDER — KETOROLAC TROMETHAMINE 30 MG/ML IJ SOLN
30.0000 mg | Freq: Once | INTRAMUSCULAR | Status: AC
  Administered 2023-11-16: 30 mg via INTRAMUSCULAR

## 2023-11-16 NOTE — Telephone Encounter (Signed)
  Chief Complaint: back pain Symptoms: upper back pain under shoulder blades-10 out of 10 Frequency: 3 days Pertinent Negatives: Patient denies fever, CP, SOB Disposition: [] ED /[] Urgent Care (no appt availability in office) / [x] Appointment(In office/virtual)/ []  [facility]/ [] Home Care/ [] Refused Recommended Disposition /[] [place] [facility]/ []  Follow-up with PCP Additional Notes: Patient called with concerns for upper back pain under shoulder blades that has been going on for three days. Patient endorses pain level of 10. Patient reports using ibuprofen and heating pad with some improvement but pain does increase back to a 10. Denies fever, CP, SOB, numbness, or weakness. Per protocol, patient is recommended to be seen today. Patient's PCP has no availability today. Appointment scheduled with another provider in office for today at 10:40 AM. Patient verbalized understanding of the plan and all questions answered.    Copied from CRM [id number]. Topic: Clinical - Red Word Triage >> Nov 16, 2023  8:35 AM [place] A wrote: [facility] that prompted transfer to Nurse Triage: Patient said for three days her back has been hurting-she has tried Ibprofen and heating pad but did not help Reason for Disposition  [1] SEVERE back pain (e.g., excruciating, unable to do any normal activities) AND [2] not improved 2 hours after pain medicine  Answer Assessment - Initial Assessment Questions 1. ONSET: "When did the pain begin?"      Started three days ago 2. LOCATION: "Where does it hurt?" (upper, mid or lower back)     Upper back pain-under shoulder blades 3. SEVERITY: "How bad is the pain?"  (e.g., Scale 1-10; mild, moderate, or severe)   - MILD (1-3): Doesn't interfere with normal activities.    - MODERATE (4-7): Interferes with normal activities or awakens from sleep.    - SEVERE (8-10): Excruciating pain, unable to do any normal activities.      10 4. PATTERN: "Is the pain constant?"  (e.g., yes, no; constant, intermittent)      constant 5. RADIATION: "Does the pain shoot into your legs or somewhere else?"     no 6. CAUSE:  "What do you think is causing the back pain?"      unsure 7. BACK OVERUSE:  "Any recent lifting of heavy objects, strenuous work or exercise?"     no 8. MEDICINES: "What have you taken so far for the pain?" (e.g., nothing, acetaminophen , NSAIDS)     Ibuprofen, heating pad 9. NEUROLOGIC SYMPTOMS: "Do you have any weakness, numbness, or problems with bowel/bladder control?"     no 10. OTHER SYMPTOMS: "Do you have any other symptoms?" (e.g., fever, abdomen pain, burning with urination, blood in urine)       no  Protocols used: Back Pain-A-AH

## 2023-11-16 NOTE — Progress Notes (Signed)
 Subjective:     Patient ID: Laurie Pratt, female    DOB: 1940-08-07, 83 y.o.   MRN: [id number]  Chief Complaint  Patient presents with   Back Pain    Pt states that she has been having back pain for about 3 days she has tried ibuprofen and a heating pad     HPI  History of Present Illness         C/o upper back pain x 3 days.  Pain is 10/10 and sharp. Continuous pain worse with movement.  Ibuprofen helps. Denies injury  Right sided chest pain with deep breathing. States she just noticed this.   Denies syncope, dizziness, fever, chills, fatigue, palpitations, shortness of breath, cough, abdominal pain, nausea, vomiting or diarrhea.  Denies edema.    Health Maintenance Due  Topic Date Due   DTaP/Tdap/Td (1 - Tdap) Never done   Pneumonia Vaccine 75+ Years old (1 of 2 - PCV) Never done   COVID-19 Vaccine (3 - 2024-25 season) 03/12/2023    Past Medical History:  Diagnosis Date   Glaucoma    History of cervical dysplasia    CIN III  w Severe dysplasia  s/p  cervical cone bx 09/ 2014   History of colon polyps    hyperplastic 08/ 2011   Hyperlipidemia    diet controlled - no meds   Hypertension    Neuromuscular disorder (HCC)    Osteopenia 12/2017   T score -1.7 FRAX 5.1% / 1.0%   Tics of organic origin    Dr. Maryjane Snider   VAIN II (vaginal intraepithelial neoplasia grade II)    Wears glasses     Past Surgical History:  Procedure Laterality Date   ABDOMINAL HYSTERECTOMY N/A 07/22/2014   Procedure: HYSTERECTOMY ABDOMINAL;  Surgeon: Davia Erps, MD;  Location: [facility];  Service: Gynecology;  Laterality: N/A;   CATARACT EXTRACTION W/ INTRAOCULAR LENS IMPLANT Left 2014   CERVICAL CONIZATION W/BX N/A 03/12/2013   Procedure: COLD KNIFE CONIZATION CERVIX WITH BIOPSY/EXAM UNDER ANESTHESIA, RECTAL EXAM;  Surgeon: Verdia Glad, MD;  Location: [facility];  Service: Gynecology;  Laterality: N/A;   CERVICAL CONIZATION W/BX N/A 03/12/2013   Procedure:  CONIZATION CERVIX WITH BIOPSY;  Surgeon: Verdia Glad, MD;  Location: [facility];  Service: Gynecology;  Laterality: N/A;  patient returned to OR for fulgeration to stop bleeding   CO2 LASER APPLICATION N/A 12/31/2015   Procedure: CO2 LASER OF THE VAGINA;  Surgeon: Alphonso Aschoff, MD;  Location: [facility];  Service: Gynecology;  Laterality: N/A;   COLONOSCOPY  02-23-2010   DILATION AND CURETTAGE OF UTERUS     SALPINGOOPHORECTOMY Bilateral 07/22/2014   Procedure: BILATERAL SALPINGO OOPHORECTOMY;  Surgeon: Davia Erps, MD;  Location: [facility];  Service: Gynecology;  Laterality: Bilateral;   TUBAL LIGATION      Family History  Problem Relation Age of Onset   Hypertension Mother    Diabetes Father    Hypertension Father    Heart disease Maternal Aunt        CHF   Ovarian cancer Sister    Colon cancer Neg Hx    Esophageal cancer Neg Hx    Pancreatic cancer Neg Hx    Stomach cancer Neg Hx     Social History   Socioeconomic History   Marital status: Widowed    Spouse name: Not on file   Number of children: 2   Years of education: 31  Highest education level: High school graduate  Occupational History   Occupation: retired   Tobacco Use   Smoking status: Never   Smokeless tobacco: Never  Vaping Use   Vaping status: Never Used  Substance and Sexual Activity   Alcohol use: Yes    Alcohol/week: 0.0 standard drinks of alcohol    Comment: rare; 1 beer per year   Drug use: No   Sexual activity: Not Currently    Comment: 1st intercourse- 19, partners- 2, widow  Other Topics Concern   Not on file  Social History Narrative   Patient is widowed with 2 children.    Patient is right handed   Patient has a high school education   Patient drinks 1 cup daily   Exercises regulary   Social Drivers of Health   Financial Resource Strain: Low Risk  (03/22/2023)   Overall Financial Resource Strain (CARDIA)    Difficulty of Paying Living Expenses:  Not hard at all  Food Insecurity: No Food Insecurity (03/22/2023)   Hunger Vital Sign    Worried About Running Out of Food in the Last Year: Never true    Ran Out of Food in the Last Year: Never true  Transportation Needs: No Transportation Needs (03/22/2023)   PRAPARE - Administrator, Civil Service (Medical): No    Lack of Transportation (Non-Medical): No  Physical Activity: Sufficiently Active (03/22/2023)   Exercise Vital Sign    Days of Exercise per Week: 5 days    Minutes of Exercise per Session: 30 min  Stress: No Stress Concern Present (12/16/2021)   [employer] of Occupational Health - Occupational Stress Questionnaire    Feeling of Stress : Only a little  Social Connections: Moderately Integrated (03/22/2023)   Social Connection and Isolation Panel [NHANES]    Frequency of Communication with Friends and Family: More than three times a week    Frequency of Social Gatherings with Friends and Family: More than three times a week    Attends Religious Services: More than 4 times per year    Active Member of [employer] or Organizations: Yes    Attends Banker Meetings: More than 4 times per year    Marital Status: Widowed  Intimate Partner Violence: Not At Risk (03/22/2023)   Humiliation, Afraid, Rape, and Kick questionnaire    Fear of Current or Ex-Partner: No    Emotionally Abused: No    Physically Abused: No    Sexually Abused: No    Outpatient Medications Prior to Visit  Medication Sig Dispense Refill   atenolol  (TENORMIN ) 100 MG tablet Take 1 tablet (100 mg total) by mouth daily. 90 tablet 0   Cholecalciferol (VITAMIN D3) 1000 units CAPS Take 1 capsule by mouth every morning.     cyclobenzaprine  (FLEXERIL ) 5 MG tablet Take 1 tablet (5 mg total) by mouth 3 (three) times daily as needed. 40 tablet 1   losartan  (COZAAR ) 100 MG tablet TAKE 1 TABLET(100 MG) BY MOUTH EVERY MORNING 90 tablet 3   potassium chloride  (KLOR-CON  M) 10 MEQ tablet TAKE 1 TABLET(10  MEQ) BY MOUTH TWICE DAILY 90 tablet 0   predniSONE  (DELTASONE ) 10 MG tablet 3 tabs by mouth per day for 3 days,2tabs per day for 3 days,1tab per day for 3 days 18 tablet 0   traMADol  (ULTRAM ) 50 MG tablet Take 1 tablet (50 mg total) by mouth every 6 (six) hours as needed. 30 tablet 0   trihexyphenidyl  (ARTANE ) 2 MG tablet TAKE 1&  1/2 TABLETS BY MOUTH THREE TIMES DAILY 410 tablet 0   No facility-administered medications prior to visit.    Allergies  Allergen Reactions   Ace Inhibitors Other (See Comments)    cough   Doxazosin Mesylate     Unknown reaction   Verapamil Swelling    REACTION: legs swelling, tired    ROS See hpi    Objective:     Physical Exam Constitutional:      General: She is not in acute distress.    Appearance: She is not ill-appearing.  HENT:     Head: Atraumatic.     Nose: Nose normal.     Mouth/Throat:     Mouth: Mucous membranes are moist.  Eyes:     Extraocular Movements: Extraocular movements intact.     Conjunctiva/sclera: Conjunctivae normal.  Cardiovascular:     Rate and Rhythm: Normal rate and regular rhythm.  Pulmonary:     Effort: Pulmonary effort is normal.     Breath sounds: Normal breath sounds.  Abdominal:     General: Bowel sounds are normal. There is no distension.     Palpations: Abdomen is soft.     Tenderness: There is no abdominal tenderness. There is no guarding or rebound.  Musculoskeletal:     Cervical back: Normal, normal range of motion and neck supple. No tenderness or bony tenderness. No pain with movement. Normal range of motion.     Thoracic back: Tenderness present. Normal range of motion.     Lumbar back: Normal. Normal range of motion. Negative right straight leg raise test and negative left straight leg raise test.     Right lower leg: No edema.     Left lower leg: No edema.     Comments: Thoracic bilateral paraspinal muscle TTP, no midline tenderness. Pain reproduced with movement.   Lymphadenopathy:      Cervical: No cervical adenopathy.  Skin:    General: Skin is warm and dry.     Coloration: Skin is not pale.  Neurological:     General: No focal deficit present.     Mental Status: She is alert and oriented to person, place, and time.     Cranial Nerves: No cranial nerve deficit.     Motor: No weakness.     Coordination: Coordination normal.     Gait: Gait normal.  Psychiatric:        Mood and Affect: Mood normal.        Behavior: Behavior normal.        Thought Content: Thought content normal.      BP 138/80 (BP Location: Left Arm, Patient Position: Sitting, Cuff Size: Normal)   Pulse (!) 58   Temp 98.4 F (36.9 C) (Oral)   Ht 5\' 5"  (1.651 m)   Wt 182 lb (82.6 kg)   SpO2 98%   BMI 30.29 kg/m  Wt Readings from Last 3 Encounters:  11/16/23 182 lb (82.6 kg)  03/22/23 177 lb (80.3 kg)  03/30/22 179 lb (81.2 kg)       Assessment & Plan:   Problem List Items Addressed This Visit     Essential hypertension   Relevant Orders   CBC with Differential/Platelet (Completed)   Comprehensive metabolic panel with GFR (Completed)   Other Visit Diagnoses       Acute upper back pain    -  Primary   Relevant Medications   ketorolac  (TORADOL ) 30 MG/ML injection 30 mg (Completed)   Other Relevant Orders  DG Chest 2 View (Completed)   CBC with Differential/Platelet (Completed)   Comprehensive metabolic panel with GFR (Completed)   D-dimer, quantitative (Completed)   Troponin I (High Sensitivity) (Completed)     Intermittent right-sided chest pain       Relevant Orders   DG Chest 2 View (Completed)   CBC with Differential/Platelet (Completed)   Comprehensive metabolic panel with GFR (Completed)   D-dimer, quantitative (Completed)   Troponin I (High Sensitivity) (Completed)   EKG 12-Lead (Completed)       EKG shows NSR, rate 61, LVH, no Q waves or acute ST- T wave changes.  BP symmetrical.  Left 154/66, Right 150/60 Chest XR shows left basilar linear opacities, likely  atelectasis  Troponin negative D-dimer normal as adjusted for age   Toradol  30 mg IM given in office per patient request for pain.  Most likely etiology is MSK.   She will continue with conservative management of symptoms with heat, ibuprofen (starting tomorrow) and Tylenol  prn. Follow up if not improving.  She will call 911 or go to the ED if any worsening symptoms.    I am having Derrek Flicker. Gardin maintain her Vitamin D3, traMADol , cyclobenzaprine , predniSONE , potassium chloride , losartan , trihexyphenidyl , and atenolol . We administered ketorolac .  Meds ordered this encounter  Medications   ketorolac  (TORADOL ) 30 MG/ML injection 30 mg

## 2023-11-16 NOTE — Progress Notes (Signed)
 Please let her know that her lab results are stable.  Her potassium is just slightly low.  Is she taking her potassium chloride  pill every day?  If so, ask her to take 2 pills for the next 3 days and then go back to the 1 pill daily.  If she has not been taking it, I recommend that she start taking it daily.  Nothing concerning otherwise.

## 2023-11-16 NOTE — Patient Instructions (Signed)
 We will be in touch with your lab results.   You received a shot of Toradol  30 mg IM in our office today.   If you have any chest pain, heart racing, shortness of breath or any new symptoms, you should call 911 or go to the closest emergency department.

## 2023-11-22 ENCOUNTER — Ambulatory Visit: Payer: Self-pay

## 2024-01-18 ENCOUNTER — Telehealth: Payer: Self-pay | Admitting: Internal Medicine

## 2024-01-18 NOTE — Telephone Encounter (Unsigned)
 Copied from CRM [id number]. Topic: Clinical - Medication Refill >> Jan 18, 2024 12:11 PM Adrionna Y wrote: Medication: trihexyphenidyl  (ARTANE ) 2 MG tablet   Has the patient contacted their pharmacy? Yes (Agent: If no, request that the patient contact the pharmacy for the refill. If patient does not wish to contact the pharmacy document the reason why and proceed with request.) (Agent: If yes, when and what did the pharmacy advise?)  This is the patient's preferred pharmacy:  [facility] [id number] - [place], [place] - [address] AT [facility] [address] [place] [address] Phone: [phone] Fax: [phone]  Is this the correct pharmacy for this prescription? Yes If no, delete pharmacy and type the correct one.   Has the prescription been filled recently? No  Is the patient out of the medication? Yes  Has the patient been seen for an appointment in the last year OR does the patient have an upcoming appointment? Yes  Can we respond through MyChart? No  Agent: Please be advised that Rx refills may take up to 3 business days. We ask that you follow-up with your pharmacy.

## 2024-01-21 ENCOUNTER — Other Ambulatory Visit: Payer: Self-pay | Admitting: Internal Medicine

## 2024-03-25 ENCOUNTER — Ambulatory Visit (INDEPENDENT_AMBULATORY_CARE_PROVIDER_SITE_OTHER): Payer: Medicare PPO

## 2024-03-25 VITALS — Ht 65.0 in | Wt 182.0 lb

## 2024-03-25 DIAGNOSIS — Z0000 Encounter for general adult medical examination without abnormal findings: Secondary | ICD-10-CM

## 2024-03-25 NOTE — Progress Notes (Addendum)
 Subjective:   Laurie Pratt is a 83 y.o. who presents for a Medicare Wellness preventive visit.  As a reminder, Annual Wellness Visits don't include a physical exam, and some assessments may be limited, especially if this visit is performed virtually. We may recommend an in-person follow-up visit with your provider if needed.  Visit Complete: Virtual I connected with  Laurie Pratt on 03/25/24 by a audio enabled telemedicine application and verified that I am speaking with the correct person using two identifiers.  Patient Location: Home  Provider Location: Office/Clinic  I discussed the limitations of evaluation and management by telemedicine. The patient expressed understanding and agreed to proceed.  Vital Signs: Because this visit was a virtual/telehealth visit, some criteria may be missing or patient reported. Any vitals not documented were not able to be obtained and vitals that have been documented are patient reported.  VideoDeclined- This patient declined Librarian, academic. Therefore the visit was completed with audio only.  Persons Participating in Visit: Patient.  AWV Questionnaire: No: Patient Medicare AWV questionnaire was not completed prior to this visit.  Cardiac Risk Factors include: advanced age (>30men, >67 women);dyslipidemia;hypertension;obesity (BMI >30kg/m2)     Objective:    Today's Vitals   03/25/24 1313  Weight: 182 lb (82.6 kg)  Height: 5' 5 (1.651 m)   Body mass index is 30.29 kg/m.     03/25/2024    1:27 PM 03/22/2023    2:04 PM 12/16/2021    2:19 PM 12/14/2020   12:46 PM 12/31/2015   11:05 AM 07/22/2014    5:21 PM 07/18/2014    9:43 AM  Advanced Directives  Does Patient Have a Medical Advance Directive? Yes Yes Yes No Yes  No  No   Type of Estate agent of [place];Living will Healthcare Power of [place];Living will Living will;Healthcare Power of Attorney  Living will     Does patient want  to make changes to medical advance directive?   No - Patient declined No - Patient declined No - Patient declined     Copy of Healthcare Power of Attorney in Chart? No - copy requested No - copy requested No - copy requested      Would patient like information on creating a medical advance directive?      No - patient declined information  No - patient declined information      Data saved with a previous flowsheet row definition    Current Medications (verified) Outpatient Encounter Medications as of 03/25/2024  Medication Sig   atenolol  (TENORMIN ) 100 MG tablet Take 1 tablet (100 mg total) by mouth daily.   Cholecalciferol (VITAMIN D3) 1000 units CAPS Take 1 capsule by mouth every morning.   cyclobenzaprine  (FLEXERIL ) 5 MG tablet Take 1 tablet (5 mg total) by mouth 3 (three) times daily as needed.   losartan  (COZAAR ) 100 MG tablet TAKE 1 TABLET(100 MG) BY MOUTH EVERY MORNING   potassium chloride  (KLOR-CON  M) 10 MEQ tablet TAKE 1 TABLET(10 MEQ) BY MOUTH TWICE DAILY   predniSONE  (DELTASONE ) 10 MG tablet 3 tabs by mouth per day for 3 days,2tabs per day for 3 days,1tab per day for 3 days   traMADol  (ULTRAM ) 50 MG tablet Take 1 tablet (50 mg total) by mouth every 6 (six) hours as needed.   trihexyphenidyl  (ARTANE ) 2 MG tablet TAKE 1& 1/2 TABLETS BY MOUTH THREE TIMES DAILY   No facility-administered encounter medications on file as of 03/25/2024.    Allergies (verified)  Ace inhibitors, Doxazosin mesylate, and Verapamil   History: Past Medical History:  Diagnosis Date   Glaucoma    History of cervical dysplasia    CIN III  w Severe dysplasia  s/p  cervical cone bx 09/ 2014   History of colon polyps    hyperplastic 08/ 2011   Hyperlipidemia    diet controlled - no meds   Hypertension    Neuromuscular disorder (HCC)    Osteopenia 12/2017   T score -1.7 FRAX 5.1% / 1.0%   Tics of organic origin    Dr. Germaine   VAIN II (vaginal intraepithelial neoplasia grade II)    Wears glasses     Past Surgical History:  Procedure Laterality Date   ABDOMINAL HYSTERECTOMY N/A 07/22/2014   Procedure: HYSTERECTOMY ABDOMINAL;  Surgeon: Curlee VEAR Guan, MD;  Location: [facility];  Service: Gynecology;  Laterality: N/A;   CATARACT EXTRACTION W/ INTRAOCULAR LENS IMPLANT Left 2014   CERVICAL CONIZATION W/BX N/A 03/12/2013   Procedure: COLD KNIFE CONIZATION CERVIX WITH BIOPSY/EXAM UNDER ANESTHESIA, RECTAL EXAM;  Surgeon: Toribio LITTIE Percy, MD;  Location: [facility];  Service: Gynecology;  Laterality: N/A;   CERVICAL CONIZATION W/BX N/A 03/12/2013   Procedure: CONIZATION CERVIX WITH BIOPSY;  Surgeon: Toribio LITTIE Percy, MD;  Location: [facility];  Service: Gynecology;  Laterality: N/A;  patient returned to OR for fulgeration to stop bleeding   CO2 LASER APPLICATION N/A 12/31/2015   Procedure: CO2 LASER OF THE VAGINA;  Surgeon: Maurilio Ship, MD;  Location: [facility];  Service: Gynecology;  Laterality: N/A;   COLONOSCOPY  02-23-2010   DILATION AND CURETTAGE OF UTERUS     SALPINGOOPHORECTOMY Bilateral 07/22/2014   Procedure: BILATERAL SALPINGO OOPHORECTOMY;  Surgeon: Curlee VEAR Guan, MD;  Location: [facility];  Service: Gynecology;  Laterality: Bilateral;   TUBAL LIGATION     Family History  Problem Relation Age of Onset   Hypertension Mother    Diabetes Father    Hypertension Father    Heart disease Maternal Aunt        CHF   Ovarian cancer Sister    Colon cancer Neg Hx    Esophageal cancer Neg Hx    Pancreatic cancer Neg Hx    Stomach cancer Neg Hx    Social History   Socioeconomic History   Marital status: Widowed    Spouse name: Not on file   Number of children: 2   Years of education: 12   Highest education level: High school graduate  Occupational History   Occupation: retired   Tobacco Use   Smoking status: Never   Smokeless tobacco: Never  Vaping Use   Vaping status: Never Used  Substance and Sexual Activity   Alcohol  use: Not Currently   Drug use: No   Sexual activity: Not Currently    Comment: 1st intercourse- 19, partners- 2, widow  Other Topics Concern   Not on file  Social History Narrative   Patient is widowed with 2 children.    Patient is right handed   Patient has a high school education   Patient drinks 1 cup daily   Exercises regulary   Social Drivers of Health   Financial Resource Strain: Low Risk  (03/25/2024)   Overall Financial Resource Strain (CARDIA)    Difficulty of Paying Living Expenses: Not hard at all  Food Insecurity: No Food Insecurity (03/25/2024)   Hunger Vital Sign    Worried About Programme researcher, broadcasting/film/video in  the Last Year: Never true    Ran Out of Food in the Last Year: Never true  Transportation Needs: No Transportation Needs (03/25/2024)   PRAPARE - Administrator, Civil Service (Medical): No    Lack of Transportation (Non-Medical): No  Physical Activity: Insufficiently Active (03/25/2024)   Exercise Vital Sign    Days of Exercise per Week: 7 days    Minutes of Exercise per Session: 20 min  Stress: No Stress Concern Present (03/25/2024)   [employer] of Occupational Health - Occupational Stress Questionnaire    Feeling of Stress: Not at all  Social Connections: Moderately Integrated (03/25/2024)   Social Connection and Isolation Panel    Frequency of Communication with Friends and Family: More than three times a week    Frequency of Social Gatherings with Friends and Family: More than three times a week    Attends Religious Services: More than 4 times per year    Active Member of [employer] or Organizations: Yes    Attends Banker Meetings: More than 4 times per year    Marital Status: Widowed    Tobacco Counseling Counseling given: Not Answered    Clinical Intake:  Pre-visit preparation completed: Yes  Pain : No/denies pain     BMI - recorded: 30.29 Nutritional Status: BMI > 30  Obese Nutritional Risks: None Diabetes: No  No  results found for: HGBA1C   How often do you need to have someone help you when you read instructions, pamphlets, or other written materials from your doctor or pharmacy?: 1 - Never  Interpreter Needed?: No  Information entered by :: Verdie Saba, CMA   Activities of Daily Living     03/25/2024    1:15 PM  In your present state of health, do you have any difficulty performing the following activities:  Hearing? 0  Vision? 0  Difficulty concentrating or making decisions? 0  Walking or climbing stairs? 0  Dressing or bathing? 0  Doing errands, shopping? 0  Preparing Food and eating ? N  Using the Toilet? N  In the past six months, have you accidently leaked urine? N  Do you have problems with loss of bowel control? N  Managing your Medications? N  Managing your Finances? N  Housekeeping or managing your Housekeeping? N    Patient Care Team: Plotnikov, Karlynn GAILS, MD as PCP - General Obie Princella HERO, MD (Inactive) (Gastroenterology) Octavia Charleston, MD as Consulting Physician (Ophthalmology)  I have updated your Care Teams any recent Medical Services you may have received from other providers in the past year.     Assessment:   This is a routine wellness examination for [place].  Hearing/Vision screen Hearing Screening - Comments:: Denies hearing difficulties   Vision Screening - Comments:: Wears rx glasses - up to date with routine eye exams with [facility]   Goals Addressed               This Visit's Progress     Patient Stated (pt-stated)        Patient stated she plans to continue to walk and read much       Depression Screen     03/25/2024    1:16 PM 03/22/2023    2:06 PM 12/27/2021   10:00 AM 12/16/2021    2:22 PM 05/18/2021    9:53 AM 12/14/2020   12:45 PM 11/16/2020    1:27 PM  PHQ 2/9 Scores  PHQ - 2 Score 0  0 0 0 0 0 0  PHQ- 9 Score 0 0   0 0 0    Fall Risk     03/25/2024    1:16 PM 03/22/2023    2:05 PM 12/27/2021   10:00 AM 12/16/2021    2:20 PM  05/18/2021    9:53 AM  Fall Risk   Falls in the past year? 1 0 0 0 0  Number falls in past yr: 0 0 0 0 0  Comment 1      Injury with Fall? 0 0 0 0 0  Risk for fall due to :  No Fall Risks No Fall Risks No Fall Risks No Fall Risks  Follow up Falls evaluation completed;Education provided Falls prevention discussed Falls evaluation completed  Falls evaluation completed       Data saved with a previous flowsheet row definition    MEDICARE RISK AT HOME:  Medicare Risk at Home Any stairs in or around the home?: Yes (outside) If so, are there any without handrails?: Yes Home free of loose throw rugs in walkways, pet beds, electrical cords, etc?: Yes Adequate lighting in your home to reduce risk of falls?: Yes Life alert?: No Use of a cane, walker or w/c?: No Grab bars in the bathroom?: No Shower chair or bench in shower?: No Elevated toilet seat or a handicapped toilet?: No  TIMED UP AND GO:  Was the test performed?  No  Cognitive Function: 6CIT completed        03/25/2024    1:20 PM 03/22/2023    2:06 PM 12/16/2021    2:28 PM  6CIT Screen  What Year? 0 points 0 points 0 points  What month? 0 points 0 points 0 points  What time? 0 points 0 points 0 points  Count back from 20 0 points 0 points 0 points  Months in reverse 0 points 0 points 0 points  Repeat phrase 2 points 0 points 0 points  Total Score 2 points 0 points 0 points    Immunizations Immunization History  Administered Date(s) Administered   PFIZER(Purple Top)SARS-COV-2 Vaccination 02/17/2020, 03/09/2020    Screening Tests Health Maintenance  Topic Date Due   DTaP/Tdap/Td (1 - Tdap) Never done   Pneumococcal Vaccine: 50+ Years (1 of 2 - PCV) Never done   COVID-19 Vaccine (3 - 2025-26 season) 03/11/2024   Medicare Annual Wellness (AWV)  03/25/2025   DEXA SCAN  Completed   HPV VACCINES  Aged Out   Meningococcal B Vaccine  Aged Out   Influenza Vaccine  Discontinued   Colonoscopy  Discontinued   Hepatitis C  Screening  Discontinued   Zoster Vaccines- Shingrix  Discontinued    Health Maintenance Items Addressed:  03/25/2024  Additional Screening:  Vision Screening: Recommended annual ophthalmology exams for early detection of glaucoma and other disorders of the eye. Is the patient up to date with their annual eye exam?  Yes  Who is the provider or what is the name of the office in which the patient attends annual eye exams? [facility]  Dental Screening: Recommended annual dental exams for proper oral hygiene  Community Resource Referral / Chronic Care Management: CRR required this visit?  No   CCM required this visit?  No   Plan:    I have personally reviewed and noted the following in the patient's chart:   Medical and social history Use of alcohol, tobacco or illicit drugs  Current medications and supplements including opioid prescriptions. Patient is  currently taking opioid prescriptions. Information provided to patient regarding non-opioid alternatives. Patient advised to discuss non-opioid treatment plan with their provider. Functional ability and status Nutritional status Physical activity Advanced directives List of other physicians Hospitalizations, surgeries, and ER visits in previous 12 months Vitals Screenings to include cognitive, depression, and falls Referrals and appointments  In addition, I have reviewed and discussed with patient certain preventive protocols, quality metrics, and best practice recommendations. A written personalized care plan for preventive services as well as general preventive health recommendations were provided to patient.   Verdie CHRISTELLA Saba, CMA   03/25/2024   After Visit Summary: (Declined) Due to this being a telephonic visit, with patients personalized plan was offered to patient but patient Declined AVS at this time   Notes: Scheduled an appt w/PCP to f/u.  Patient did not keep 07/2023 (6-mth appt w/PCP).  Medical screening  examination/treatment/procedure(s) were performed by non-physician practitioner and as supervising physician I was immediately available for consultation/collaboration.  I agree with above. Karlynn Noel, MD

## 2024-03-25 NOTE — Patient Instructions (Addendum)
 Laurie Pratt,  Thank you for taking the time for your Medicare Wellness Visit. I appreciate your continued commitment to your health goals. Please review the care plan we discussed, and feel free to reach out if I can assist you further.  Medicare recommends these wellness visits once per year to help you and your care team stay ahead of potential health issues. These visits are designed to focus on prevention, allowing your provider to concentrate on managing your acute and chronic conditions during your regular appointments.  Please note that Annual Wellness Visits do not include a physical exam. Some assessments may be limited, especially if the visit was conducted virtually. If needed, we may recommend a separate in-person follow-up with your provider.  Ongoing Care Seeing your primary care provider every 3 to 6 months helps us  monitor your health and provide consistent, personalized care.   Referrals If a referral was made during today's visit and you haven't received any updates within two weeks, please contact the referred provider directly to check on the status.  Recommended Screenings:  Health Maintenance  Topic Date Due   DTaP/Tdap/Td vaccine (1 - Tdap) Never done   Pneumococcal Vaccine for age over 32 (1 of 2 - PCV) Never done   COVID-19 Vaccine (3 - 2025-26 season) 03/11/2024   Medicare Annual Wellness Visit  03/25/2025   DEXA scan (bone density measurement)  Completed   HPV Vaccine  Aged Out   Meningitis B Vaccine  Aged Out   Flu Shot  Discontinued   Colon Cancer Screening  Discontinued   Hepatitis C Screening  Discontinued   Zoster (Shingles) Vaccine  Discontinued       03/22/2023    2:04 PM  Advanced Directives  Does Patient Have a Medical Advance Directive? Yes  Type of Estate agent of [place];Living will  Copy of Healthcare Power of Attorney in Chart? No - copy requested   Advance Care Planning is important because it: Ensures you  receive medical care that aligns with your values, goals, and preferences. Provides guidance to your family and loved ones, reducing the emotional burden of decision-making during critical moments.  Vision: Annual vision screenings are recommended for early detection of glaucoma, cataracts, and diabetic retinopathy. These exams can also reveal signs of chronic conditions such as diabetes and high blood pressure.  Dental: Annual dental screenings help detect early signs of oral cancer, gum disease, and other conditions linked to overall health, including heart disease and diabetes.

## 2024-03-26 ENCOUNTER — Other Ambulatory Visit: Payer: Self-pay | Admitting: Internal Medicine

## 2024-04-16 ENCOUNTER — Encounter: Payer: Self-pay | Admitting: Internal Medicine

## 2024-04-16 ENCOUNTER — Ambulatory Visit: Admitting: Internal Medicine

## 2024-04-16 VITALS — BP 140/83 | HR 79 | Temp 98.4°F | Ht 65.0 in | Wt 179.0 lb

## 2024-04-16 DIAGNOSIS — E785 Hyperlipidemia, unspecified: Secondary | ICD-10-CM

## 2024-04-16 DIAGNOSIS — G245 Blepharospasm: Secondary | ICD-10-CM

## 2024-04-16 DIAGNOSIS — Z0000 Encounter for general adult medical examination without abnormal findings: Secondary | ICD-10-CM

## 2024-04-16 DIAGNOSIS — M8580 Other specified disorders of bone density and structure, unspecified site: Secondary | ICD-10-CM | POA: Diagnosis not present

## 2024-04-16 DIAGNOSIS — I10 Essential (primary) hypertension: Secondary | ICD-10-CM

## 2024-04-16 MED ORDER — POTASSIUM CHLORIDE CRYS ER 10 MEQ PO TBCR: 10.0000 meq | EXTENDED_RELEASE_TABLET | Freq: Every day | ORAL | 3 refills | Status: AC

## 2024-04-16 MED ORDER — LOSARTAN POTASSIUM 100 MG PO TABS: ORAL_TABLET | ORAL | 3 refills | Status: AC

## 2024-04-16 MED ORDER — TRIHEXYPHENIDYL HCL 2 MG PO TABS: ORAL_TABLET | ORAL | 2 refills | Status: AC

## 2024-04-16 MED ORDER — ATENOLOL 100 MG PO TABS: 100.0000 mg | ORAL_TABLET | Freq: Every day | ORAL | 3 refills | Status: AC

## 2024-04-16 NOTE — Assessment & Plan Note (Signed)
Cont on Losartan, Tenoretic

## 2024-04-16 NOTE — Progress Notes (Signed)
 Subjective:  Patient ID: Laurie Pratt, female    DOB: [birth year]  Age: 83 y.o. MRN: [id number]  CC: Annual Exam   HPI JAMISE PENTLAND presents for blepharospasm, HTN, tic disorder Pt declined shots  Outpatient Medications Prior to Visit  Medication Sig Dispense Refill   Cholecalciferol (VITAMIN D3) 1000 units CAPS Take 1 capsule by mouth every morning.     cyclobenzaprine  (FLEXERIL ) 5 MG tablet Take 1 tablet (5 mg total) by mouth 3 (three) times daily as needed. 40 tablet 1   predniSONE  (DELTASONE ) 10 MG tablet 3 tabs by mouth per day for 3 days,2tabs per day for 3 days,1tab per day for 3 days 18 tablet 0   traMADol  (ULTRAM ) 50 MG tablet Take 1 tablet (50 mg total) by mouth every 6 (six) hours as needed. 30 tablet 0   atenolol  (TENORMIN ) 100 MG tablet TAKE 1 TABLET(100 MG) BY MOUTH DAILY 90 tablet 3   losartan  (COZAAR ) 100 MG tablet TAKE 1 TABLET(100 MG) BY MOUTH EVERY MORNING 90 tablet 3   potassium chloride  (KLOR-CON  M) 10 MEQ tablet TAKE 1 TABLET(10 MEQ) BY MOUTH TWICE DAILY 90 tablet 0   trihexyphenidyl  (ARTANE ) 2 MG tablet TAKE 1& 1/2 TABLETS BY MOUTH THREE TIMES DAILY 410 tablet 0   No facility-administered medications prior to visit.    ROS: Review of Systems  Constitutional:  Negative for activity change, appetite change, chills, fatigue and unexpected weight change.  HENT:  Negative for congestion, mouth sores and sinus pressure.   Eyes:  Negative for visual disturbance.  Respiratory:  Negative for cough and chest tightness.   Gastrointestinal:  Negative for abdominal pain and nausea.  Genitourinary:  Negative for difficulty urinating, frequency and vaginal pain.  Musculoskeletal:  Negative for back pain and gait problem.  Skin:  Negative for pallor and rash.  Neurological:  Positive for speech difficulty. Negative for dizziness, tremors, weakness, numbness and headaches.  Hematological:  Does not bruise/bleed easily.  Psychiatric/Behavioral:  Negative for  confusion, decreased concentration and sleep disturbance.     Objective:  BP (!) 140/83 (BP Location: Left Arm, Patient Position: Sitting, Cuff Size: Normal)   Pulse 79   Temp 98.4 F (36.9 C) (Oral)   Ht 5' 5 (1.651 m)   Wt 179 lb (81.2 kg)   SpO2 94%   BMI 29.79 kg/m   BP Readings from Last 3 Encounters:  04/16/24 (!) 140/83  11/16/23 138/80  03/30/22 (!) 156/78    Wt Readings from Last 3 Encounters:  04/16/24 179 lb (81.2 kg)  03/25/24 182 lb (82.6 kg)  11/16/23 182 lb (82.6 kg)    Physical Exam Constitutional:      General: She is not in acute distress.    Appearance: Normal appearance. She is well-developed.  HENT:     Head: Normocephalic.     Right Ear: External ear normal.     Left Ear: External ear normal.     Nose: Nose normal.  Eyes:     General:        Right eye: No discharge.        Left eye: No discharge.     Conjunctiva/sclera: Conjunctivae normal.     Pupils: Pupils are equal, round, and reactive to light.  Neck:     Thyroid : No thyromegaly.     Vascular: No JVD.     Trachea: No tracheal deviation.  Cardiovascular:     Rate and Rhythm: Normal rate and regular rhythm.  Heart sounds: Normal heart sounds.  Pulmonary:     Effort: No respiratory distress.     Breath sounds: No stridor. No wheezing.  Abdominal:     General: Bowel sounds are normal. There is no distension.     Palpations: Abdomen is soft. There is no mass.     Tenderness: There is no abdominal tenderness. There is no guarding or rebound.  Musculoskeletal:        General: No tenderness.     Cervical back: Normal range of motion and neck supple. No rigidity.  Lymphadenopathy:     Cervical: No cervical adenopathy.  Skin:    Findings: No erythema or rash.  Neurological:     Cranial Nerves: No cranial nerve deficit.     Motor: No abnormal muscle tone.     Coordination: Coordination normal.     Deep Tendon Reflexes: Reflexes normal.  Psychiatric:        Behavior: Behavior  normal.        Thought Content: Thought content normal.        Judgment: Judgment normal.    blepharospasm, tics - not new  Lab Results  Component Value Date   WBC 7.8 11/16/2023   HGB 12.6 11/16/2023   HCT 37.7 11/16/2023   PLT 351.0 11/16/2023   GLUCOSE 99 11/16/2023   CHOL 233 (H) 05/18/2021   TRIG 114.0 05/18/2021   HDL 55.30 05/18/2021   LDLDIRECT 134.0 07/08/2015   LDLCALC 155 (H) 05/18/2021   ALT 11 11/16/2023   AST 17 11/16/2023   NA 137 11/16/2023   K 3.4 (L) 11/16/2023   CL 101 11/16/2023   CREATININE 1.37 (H) 11/16/2023   BUN 17 11/16/2023   CO2 27 11/16/2023   TSH 3.80 05/18/2021   INR 1.0 06/09/2014    US  RENAL Result Date: 04/18/2022 CLINICAL DATA:  CKD (chronic kidney disease) stage 4, GFR 15-29 ml/min (HCC) EXAM: RENAL / URINARY TRACT ULTRASOUND COMPLETE COMPARISON:  June 14, 2021, August 04, 2017 FINDINGS: Right Kidney: Renal measurements: 8.2 x 4.8 x 4.4 cm = volume: 89 mL. Echogenicity is mildly increased. No hydronephrosis visualized. Revisualization of a previously characterized 15 mm cyst in the interpolar kidney (for which no dedicated imaging follow-up is recommended). Left Kidney: Renal measurements: 9.2 x 5.2 x 5.7 cm = volume: 144 mL. Echogenicity is mildly increased. No mass or hydronephrosis visualized. Bladder: Appears normal for degree of bladder distention. Other: None. IMPRESSION: 1. No evidence of obstructive uropathy. 2. Mildly increased echogenicity as can be seen in the setting of medical renal disease. Electronically Signed   By: Corean Salter M.D.   On: 04/18/2022 11:30    Assessment & Plan:   Problem List Items Addressed This Visit     Blepharospasm (Chronic)   Relevant Orders   CBC with Differential/Platelet   Dyslipidemia   Relevant Orders   TSH   Lipid panel   Essential hypertension - Primary   Cont on Losartan , Tenoretic       Relevant Medications   atenolol  (TENORMIN ) 100 MG tablet   losartan  (COZAAR ) 100 MG  tablet   Osteopenia   On Vit D      Well adult exam   Relevant Orders   TSH   Urinalysis   CBC with Differential/Platelet   Lipid panel   Comprehensive metabolic panel with GFR      Meds ordered this encounter  Medications   atenolol  (TENORMIN ) 100 MG tablet    Sig: Take 1 tablet (100 mg total)  by mouth daily.    Dispense:  90 tablet    Refill:  3   losartan  (COZAAR ) 100 MG tablet    Sig: TAKE 1 TABLET(100 MG) BY MOUTH EVERY MORNING    Dispense:  90 tablet    Refill:  3   potassium chloride  (KLOR-CON  M) 10 MEQ tablet    Sig: Take 1 tablet (10 mEq total) by mouth daily.    Dispense:  90 tablet    Refill:  3   trihexyphenidyl  (ARTANE ) 2 MG tablet    Sig: TAKE 1& 1/2 TABLETS BY MOUTH THREE TIMES DAILY    Dispense:  410 tablet    Refill:  2      Follow-up: Return in about 6 months (around 10/15/2024) for Wellness Exam.  Marolyn Noel, MD

## 2024-04-16 NOTE — Assessment & Plan Note (Signed)
 On Vit D

## 2024-10-15 ENCOUNTER — Ambulatory Visit: Admitting: Internal Medicine

## 2025-03-28 ENCOUNTER — Ambulatory Visit
